# Patient Record
Sex: Female | Born: 1975 | Race: White | Hispanic: No | Marital: Single | Smoking: Current every day smoker
Health system: Southern US, Community
[De-identification: ages and names within clinical notes are randomized; demographics above are authoritative.]

## PROBLEM LIST (undated history)

## (undated) DIAGNOSIS — G2581 Restless legs syndrome: Secondary | ICD-10-CM

## (undated) DIAGNOSIS — R569 Unspecified convulsions: Secondary | ICD-10-CM

## (undated) DIAGNOSIS — I10 Essential (primary) hypertension: Secondary | ICD-10-CM

## (undated) DIAGNOSIS — I639 Cerebral infarction, unspecified: Secondary | ICD-10-CM

## (undated) DIAGNOSIS — J45909 Unspecified asthma, uncomplicated: Secondary | ICD-10-CM

## (undated) DIAGNOSIS — E876 Hypokalemia: Secondary | ICD-10-CM

## (undated) DIAGNOSIS — K219 Gastro-esophageal reflux disease without esophagitis: Secondary | ICD-10-CM

## (undated) DIAGNOSIS — F419 Anxiety disorder, unspecified: Secondary | ICD-10-CM

## (undated) HISTORY — PX: NO PAST SURGERIES: SHX2092

## (undated) HISTORY — DX: Unspecified convulsions: R56.9

## (undated) HISTORY — PX: PARTIAL HYSTERECTOMY: SHX80

## (undated) HISTORY — DX: Cerebral infarction, unspecified: I63.9

---

## 1999-10-20 ENCOUNTER — Emergency Department (HOSPITAL_COMMUNITY): Admission: EM | Admit: 1999-10-20 | Discharge: 1999-10-20 | Payer: Self-pay

## 1999-10-24 ENCOUNTER — Emergency Department (HOSPITAL_COMMUNITY): Admission: EM | Admit: 1999-10-24 | Discharge: 1999-10-24 | Payer: Self-pay | Admitting: Emergency Medicine

## 1999-11-23 ENCOUNTER — Emergency Department (HOSPITAL_COMMUNITY): Admission: EM | Admit: 1999-11-23 | Discharge: 1999-11-23 | Payer: Self-pay | Admitting: Internal Medicine

## 1999-11-23 ENCOUNTER — Encounter: Payer: Self-pay | Admitting: Internal Medicine

## 1999-12-03 ENCOUNTER — Encounter: Payer: Self-pay | Admitting: Emergency Medicine

## 1999-12-03 ENCOUNTER — Inpatient Hospital Stay (HOSPITAL_COMMUNITY): Admission: EM | Admit: 1999-12-03 | Discharge: 1999-12-03 | Payer: Self-pay | Admitting: Emergency Medicine

## 1999-12-06 ENCOUNTER — Emergency Department (HOSPITAL_COMMUNITY): Admission: EM | Admit: 1999-12-06 | Discharge: 1999-12-06 | Payer: Self-pay | Admitting: Emergency Medicine

## 1999-12-07 ENCOUNTER — Encounter: Payer: Self-pay | Admitting: Emergency Medicine

## 1999-12-07 ENCOUNTER — Emergency Department (HOSPITAL_COMMUNITY): Admission: EM | Admit: 1999-12-07 | Discharge: 1999-12-07 | Payer: Self-pay | Admitting: Emergency Medicine

## 2000-03-20 ENCOUNTER — Emergency Department (HOSPITAL_COMMUNITY): Admission: EM | Admit: 2000-03-20 | Discharge: 2000-03-20 | Payer: Self-pay | Admitting: Emergency Medicine

## 2000-05-20 ENCOUNTER — Encounter: Payer: Self-pay | Admitting: Emergency Medicine

## 2000-05-20 ENCOUNTER — Inpatient Hospital Stay (HOSPITAL_COMMUNITY): Admission: EM | Admit: 2000-05-20 | Discharge: 2000-05-22 | Payer: Self-pay | Admitting: Emergency Medicine

## 2000-05-28 ENCOUNTER — Emergency Department (HOSPITAL_COMMUNITY): Admission: EM | Admit: 2000-05-28 | Discharge: 2000-05-28 | Payer: Self-pay | Admitting: Emergency Medicine

## 2003-08-18 ENCOUNTER — Emergency Department (HOSPITAL_COMMUNITY): Admission: EM | Admit: 2003-08-18 | Discharge: 2003-08-19 | Payer: Self-pay | Admitting: Emergency Medicine

## 2006-08-26 ENCOUNTER — Emergency Department (HOSPITAL_COMMUNITY): Admission: EM | Admit: 2006-08-26 | Discharge: 2006-08-27 | Payer: Self-pay | Admitting: Emergency Medicine

## 2006-10-19 ENCOUNTER — Emergency Department (HOSPITAL_COMMUNITY): Admission: EM | Admit: 2006-10-19 | Discharge: 2006-10-19 | Payer: Self-pay | Admitting: Emergency Medicine

## 2007-02-07 ENCOUNTER — Emergency Department (HOSPITAL_COMMUNITY): Admission: EM | Admit: 2007-02-07 | Discharge: 2007-02-07 | Payer: Self-pay | Admitting: Emergency Medicine

## 2008-01-05 ENCOUNTER — Emergency Department (HOSPITAL_COMMUNITY): Admission: EM | Admit: 2008-01-05 | Discharge: 2008-01-06 | Payer: Self-pay | Admitting: Emergency Medicine

## 2008-05-21 ENCOUNTER — Encounter: Payer: Self-pay | Admitting: Obstetrics and Gynecology

## 2008-05-21 ENCOUNTER — Ambulatory Visit: Payer: Self-pay | Admitting: Obstetrics & Gynecology

## 2008-05-21 ENCOUNTER — Inpatient Hospital Stay (HOSPITAL_COMMUNITY): Admission: AD | Admit: 2008-05-21 | Discharge: 2008-05-23 | Payer: Self-pay | Admitting: Obstetrics & Gynecology

## 2011-01-04 NOTE — H&P (Signed)
NAME:  Danielle Washington, Danielle Washington                  ACCOUNT NO.:  [id number]   MEDICAL RECORD NO.:  [id number]          PATIENT TYPE:  INP   LOCATION:  9374                          FACILITY:  [facility]   PHYSICIAN:  Tilda Burrow, M.D. DATE OF BIRTH:  1976-01-28   DATE OF ADMISSION:  05/21/2008  DATE OF DISCHARGE:                              HISTORY & PHYSICAL   ADMITTING DIAGNOSES:  1. Pregnancy term, delivered, no prenatal care.  2. Chronic alcohol use.  3. Preeclampsia, ruled out.   HISTORY OF PRESENT ILLNESS:  This 35 year old female gravida 2, para 2-0-  0-2 with no prenatal care, allegedly unknown pregnancy until spontaneous  rupture of membranes at 2 p.m. today after 6 hours of lower abdominal  cramping, was transported by the EMS to [facility] [place]  approximately 4:30 p.m. after the baby had been delivered in the field  by EMS with healthy baby delivered.  Placenta was delivered in the MAU  by Dr. Iona Hansen without difficulty.  The patient's initial assessment was  notable for the blood pressures being as high as 160/100 with initial  prenatal labs ordered and magnesium sulfate initiated as precaution.  Since admission, she has been quite stable, has calmed down, and blood  pressures have dramatically responded.  Blood pressures are now 99/60  with reflexes 2+.  The patient denies any history of headaches, right  upper quadrant pain, or blurry vision.   PAST MEDICAL HISTORY:  Notable for chronic alcohol use with the patient  being very comfortably acknowledging use of 2 beers to make strength  most days, sometimes up to 4 on a day.  She does state that she did not  drink for almost a month during mid summer while caring for her father  during his illness.   PRIOR GYN HISTORY:  Notable for the patient is 16 years since her most  recent vaginal delivery.  She delivered her older child began Depo-  Provera which she has used continuously until 2008.  She has never had a  menses  since discontinuing the Depo-Provera mid 2008.  She had noticed  mild occasional abdominal discomfort, but was attributing it to her  chronic abdominal pain due to ulcers.   MEDICATIONS:  Depo-Provera until 2008 x16 years and Nexium taken  routinely for ulcers and gastritis.   ALLERGIES:  Denied.   OPERATIONS:  None.   Temperature 97.8, blood pressure 99/60, pulse 70.  Reflexes 2+ without  clonus, fundus firm, U-2.   Admitting labs have returned and show liver function test normal.  Platelets are normal at 205,000.  HIV is nonreactive, hepatitis B  negative, and RPR nonreactive.  Urinalysis shows specific gravity 1.030  with negative proteinuria.   IMPRESSION:  1. Unregistered pregnancy status post out-of-hospital delivery.  2. No evidence of preeclampsia.  3. Chronic alcohol use.   PLAN:  1. Discontinue magnesium sulfate.  2. Transfer to routine postpartum floor after 4 hours of magnesium      sulfate if the patient maintains current stable status.  3. Social Services evaluation in a.m.  The patient has  been living in      [place] and alternating resident stay with her boyfriend who is      living in [place] with mother (father recently died).  4. Social Services will be asked to assess her Medicaid application.      The patient wishes to consider tubal ligation in the near future      would consider Depo-Provera bridge prior to discharge.      Tilda Burrow, M.D.  Electronically Signed     JVF/MEDQ  D:  05/21/2008  T:  05/22/2008  Job:  [id number]

## 2011-01-04 NOTE — Discharge Summary (Signed)
NAME:  Danielle Washington, Danielle Washington                  ACCOUNT NO.:  [id number]   MEDICAL RECORD NO.:  [id number]          PATIENT TYPE:  INP   LOCATION:  9101                          [facility]ILITY:  [facility]   PHYSICIAN:  Lesly Dukes, M.D. DATE OF BIRTH:  Oct 23, 1975   DATE OF ADMISSION:  05/21/2008  DATE OF DISCHARGE:  05/23/2008                               DISCHARGE SUMMARY   DIAGNOSIS AT ADMISSION:  Term labor.   DIAGNOSIS AT DISCHARGE:  Status post delivery, normal spontaenous  vaginal delivery.   PROCEDURE DURING ADMISSION:  Non-stress test.   HISTORY AND HOSPITAL COURSE:  Briefly, this is a 35 year old G2, P71, who  presented with unknown gestational age, went into labor, and delivered  in ambulance en route to the hospital.  Baby had Apgars of 10 and 10.  Spontaneous placenta was delivered.  Urine drug screen was positive for  cocaine.  Blood pressures were elevated into 170s/110s.  The patient was  started on hydrochlorothiazide 25 and transferred to the ICU.  Blood  pressure came down nicely to 100s/70s.  Mag was stopped after 24 hours,  and pregnancy-induced hypertension labs were negative.  Blood type was O  positive.  The patient was RPR nonreactive.  Rubella immune.  At  discharge, the patient was eating, ambulatory, pain had resolved and was  controlled with NSAIDs, and was eager to go home.  Social work was  consulted during the patient's stay and felt that it was adequate for  the mother to take the baby home.   DISCHARGE CONDITION:  Stable.   DISCHARGE DISPOSITION:  Will be to home.   MEDICATIONS ON DISCHARGE:  1. Phenergan 25 mg p.o. q.6 h. p.r.n. nausea.  2. Hydrochlorothiazide 25 mg p.o. daily.  3. Ibuprofen 600 mg p.o. q.6 h. p.r.n. pain.  4. Colace 100 mg p.o. b.i.d. p.r.n. constipation.  5. Iron sulfate 325 mg p.o. b.i.d.   FOLLOWUP:  Will be with [facility] in  approximately 6 weeks.      Rodney Langton, MD      Lesly Dukes,  M.D.  Electronically Signed   TT/MEDQ  D:  05/23/2008  T:  05/23/2008  Job:  [id number]

## 2011-01-07 NOTE — H&P (Signed)
[place]  Patient:    Danielle Washington, Danielle Washington                           MRN: [id number] Adm. Date:  [id number] Attending:  Felecia Shelling                         History and Physical  CHIEF COMPLAINT:  Vomiting.  PRIMARY DOCTOR:  Unassigned.  HISTORY OF PRESENT ILLNESS:  The patient is a 35 year old white female who presented to the emergency room on the day of admission with a one day history of persistent nausea, vomiting and pain. She was evaluated in the emergency room where she was noted to be vomiting persistently. She was given intravenous Phenergan, then Compazine, and then Zofran, but continued to have vomiting and abdominal pain. A CT scan of her abdomen was unremarkable. Evaluation revealed findings consistent with a urinary tract infection and possible pyelonephritis. She has been afebrile, but has complained of some diffuse abdominal pain radiating more to the right side, and particularly the right lower quadrant. There has not been any diarrhea. The last several episodes of vomiting have produced some coffee ground material in the emesis. She has had some lower back pain. She is admitted for management of intractable vomiting, dehydration and probable pyelonephritis.  ALLERGIES:  None.  MEDICATIONS:  None.  PAST MEDICAL HISTORY:  She denies any significant medical illnesses or hospitalizations. She has not had any surgery.  FAMILY HISTORY:  She is somewhat vague, but says the parents are in good health. There is no history of heart disease, diabetes or cancer.  SOCIAL HISTORY:  She does drink alcohol. She does smoke. She has a history of cocaine use, in particular, crack cocaine.  REVIEW OF SYSTEMS:  She denies any headache. She was noted to have multiple bruises including a left black eye. She, however, vehemently denies any abuse. She denies any visual changes. She has not had any hearing changes. No difficulty with nasal  congestion or difficulty swallowing. She denies any chest pain, shortness of breath or palpitations. She has had the diffuse abdominal pain, no diarrhea. No changes in bowel movements and no blood in her stool. She has not had any changes in her menstrual periods and she denies any dysuria or urinary frequency. She has not had a vaginal discharge. She denies any problems with her extremities.  PHYSICAL EXAMINATION:  GENERAL:  The patient is a well-developed, well-nourished, lethargic white female. She is somewhat vague in answering her questions.  VITAL SIGNS:  Afebrile. Blood pressure 110/70, pulse 90 and regular.  HEENT:  The head is of normal configuration without lesions. Eyes; pupils are equal, round and reactive to light and accommodation. Normal extraocular movements. Fundi is benign. There is some bruising around the left eye and diffuse excoriations on the face. Ears; TMs are clear. The throat is clear.  NECK:  Supple without adenopathy or thyromegaly.  LUNGS:  Clear to auscultation and percussion.  HEART:  Regular rate and rhythm. Normal S1 and S2 without murmurs, gallops or rubs.  ABDOMEN:  Reveals active bowel sounds. Soft. Mild diffuse tenderness, worse in the right lower quadrant.  PELVIC:  Performed by Dr. Ethelda Chick. There was no pelvic pain or cervical motion tenderness. Wet prep was negative.  RECTAL:  Performed by Dr. Ethelda Chick, and was benign. Stool was heme-negative.  EXTREMITIES:  Normal with good range of motion of  all joints.  NEUROLOGIC:  The patient is awake, alert and oriented. She answers questions appropriately. Cranial nerves 2-12 are intact. Motor, sensory and cerebellar findings were within normal limits.  LABORATORY DATA:  White blood count 11,100, hemoglobin 14.4 with 88 segs, 9 lymphocytes, 2 monocytes. Sodium 137, potassium 3.8, chloride 103, CO2 26, creatinine 0.6, BUN 8, glucose 147. Liver function studies were normal. Lipase was  normal.  Vaginal wet prep was negative. GC and chlamydia are pending. Pregnancy test was negative.  CT scan of the abdomen was negative.  Urinalysis; positive leukocytes, positive nitrites, too numerous to count white blood cells.  ASSESSMENT: 1. Probable pyelonephritis. 2. Intractable vomiting. 3. Past history of cocaine abuse. 4. Dehydration.  PLAN:  The patient will be admitted and will begin IV hydration with normal saline at 150 cc/hr. She has been started on Cipro and will continue Cipro 400 mg IV q.12h. Phenergan intravenously for nausea. DD:  05/20/00 TD:  05/20/00 Job: [id number]

## 2011-01-07 NOTE — Discharge Summary (Signed)
[place]  Patient:    Danielle Washington, Danielle Washington                           MRN: [id number] Adm. Date:  [id number] Disc. Date: [id number] Attending:  Phifer, Harriett Sine [place] Dictator:   Karlene Einstein, M.D.                           Discharge Summary  DISCHARGE DIAGNOSES: 1. Nausea and vomiting secondary to ingestion of crack cocaine. 2. History of dyspepsia. 3. Tobacco abuse. 4. History of hypokalemia. 5. Urinary tract infection.  PROCEDURES:  Acute abdominal series.  No acute or focal abnormalities noted.  CONSULTANTS:  None.  DISCHARGE MEDICATIONS:  Pepcid 20 mg p.o. q.h.s.  [phone] as needed or if the symptoms persist.  Please call for an appointment with Dr. Kerry Dory.  Also the number for ADS was given if patient is interested in getting help with her drug problems.  HISTORY OF PRESENT ILLNESS:  A 35 year old white female with history of mild dyspepsia who swallowed several rocks of crack cocaine two nights ago because she was afraid she was going to get caught with it.  Several hours later she began cramping and got nauseated and vomited blood tinged material.  She does not know if the crack came up.  Since that time she has been unable to hold down anything by mouth.  She denies headaches, blurry vision, chest pain, shortness of breath, or black and bloody stools.  PHYSICAL EXAMINATION:  VITAL SIGNS:  Temperature 98.6.  Pulse 106.  Respiration rate 18.  Blood pressure 120/74.  GENERAL:  Anxious white female rocking back and forth in bed.  HEENT:  Normocephalic, atraumatic.  Pupils are equal, round and reactive to light and accommodation.  Extraocular muscles intact.  No papilledema or exudate.  Oropharynx:  Cracked lips.  No blood.  Nasopharynx with dried blood (failed NGT insertion).  NECK:  Supple.  No lymphadenopathy.  LUNGS:  Clear.  HEART:  Regular rate and rhythm.  Good carotid  upstroke.  ABDOMEN:  Positive bowel sounds.  Soft, nontender.  No bruits.  No masses.  RECTAL:  No stool.  Guaiac positive.  EXTREMITIES:  No edema.  Moves all extremities.  NEUROLOGIC:  Sleepy, but arousable.  Alert and oriented x 3.  Moves all four extremities.  No focal deficits.  LABORATORIES:  Pregnancy test negative.  Urinalysis:  6-10 wbcs, many bacteria, greater than 80 ketones, nitrites positive, small leukocyte esterase.  Urine drug screen positive for cocaine and PHC.  CK 29, MB 1.8, troponin less than 0.03.  WBC 7.6, hemoglobin 13.4, platelets 233.  PT 14.2, INR 1.2, PTT 31.  Sodium 135, potassium 3.2, chloride 100, bicarbonate 28, BUN 8, creatinine 0.5, glucose 89, total protein 5.7, albumin 3.1, AST 16, ALT 11, alkaline phosphatase 50, total bilirubin 0.4.  HOSPITAL COURSE: #1 - NAUSEA AND VOMITING:  Usually ingestion of cocaine is treated with gastric lavage and charcoal.  However, this ingestion occurred 48 hours ago and would not be useful to do the procedure.  Also, she is not hypertensive. Does not have chest pain or shortness of breath.  Has negative enzymes.  She does have guaiac positive stools and intractable nausea and vomiting.  She was admitted to rule out obstruction.  Her acute abdominal series was negative. Hydrated with IV fluids and hemoglobin monitored.  Hemoglobin  was stable at 13.  Potassium was 4.3, lactic acid level 0.8, amylase 31, lipase 19, magnesium 1.7.  Over night she responded to IV fluids and she had no nausea or vomiting, no abdominal pain.  She was started on a clear liquid diet and advanced as tolerated.  She was discharged in stable condition without symptoms.  Will follow up as noted above.  #2 - URINARY TRACT INFECTION:  Urine culture was pending.  She was started on Bactrim DS one tablet p.o. b.i.d. for three days.  #3 - QUESTIONABLE DRUG ABUSE:  Although she denies history of drug abuse, it is doubtful.  Information about ADS  was given if patient is interested. DD:  12/03/99 TD:  12/03/99 Job: [id number]

## 2011-05-18 LAB — COMPREHENSIVE METABOLIC PANEL
AST: 15
Albumin: 3.7
Calcium: 9.4
Chloride: 97
Creatinine, Ser: 0.63
GFR calc Af Amer: >60

## 2011-05-18 LAB — LIPASE, BLOOD: Lipase: 14

## 2011-05-18 LAB — OCCULT BLOOD X 1 CARD TO LAB, STOOL: Fecal Occult Bld: NEGATIVE

## 2011-05-23 LAB — URINALYSIS, ROUTINE W REFLEX MICROSCOPIC
Protein, ur: NEGATIVE
Urobilinogen, UA: 0.2

## 2011-05-23 LAB — COMPREHENSIVE METABOLIC PANEL
ALT: 10
CO2: 21
Calcium: 8.6
Creatinine, Ser: 0.58
GFR calc non Af Amer: >60
Glucose, Bld: 96
Sodium: 136
Total Bilirubin: 0.3

## 2011-05-23 LAB — HEPATITIS B SURFACE ANTIGEN: Hepatitis B Surface Ag: NEGATIVE

## 2011-05-23 LAB — CBC
Hemoglobin: 12.2
MCHC: 33.3
MCV: 95.4
RBC: 3.85 — ABNORMAL LOW

## 2011-05-23 LAB — DIFFERENTIAL
Basophils Absolute: 0
Eosinophils Absolute: 0
Lymphs Abs: 1
Neutrophils Relative %: 90 — ABNORMAL HIGH

## 2011-05-23 LAB — RAPID URINE DRUG SCREEN, HOSP PERFORMED: Opiates: NOT DETECTED

## 2011-05-23 LAB — AMYLASE: Amylase: 38

## 2011-05-23 LAB — RAPID HIV SCREEN (WH-MAU): Rapid HIV Screen: NONREACTIVE

## 2011-05-24 LAB — CBC
HCT: 34.7 — ABNORMAL LOW
Hemoglobin: 11.5 — ABNORMAL LOW
MCHC: 33.2
MCV: 95.3
RDW: 14.3

## 2011-06-08 LAB — WET PREP, GENITAL
Trich, Wet Prep: NONE SEEN
WBC, Wet Prep HPF POC: NONE SEEN
Yeast Wet Prep HPF POC: NONE SEEN

## 2011-06-08 LAB — URINALYSIS, ROUTINE W REFLEX MICROSCOPIC
Bilirubin Urine: NEGATIVE
Glucose, UA: NEGATIVE
Hgb urine dipstick: NEGATIVE
Ketones, ur: 15 — AB
Protein, ur: 100 — AB
pH: 8.5 — ABNORMAL HIGH

## 2011-06-08 LAB — DIFFERENTIAL
Basophils Absolute: 0.1
Eosinophils Absolute: 0
Eosinophils Relative: 0
Lymphocytes Relative: 7 — ABNORMAL LOW
Lymphs Abs: 1.3
Monocytes Absolute: 1.6 — ABNORMAL HIGH

## 2011-06-08 LAB — CBC
HCT: 47.9 — ABNORMAL HIGH
Hemoglobin: 16.2 — ABNORMAL HIGH
MCV: 94.8
Platelets: 283
RDW: 15 — ABNORMAL HIGH

## 2011-06-08 LAB — BASIC METABOLIC PANEL
BUN: 15
Chloride: 88 — ABNORMAL LOW
Glucose, Bld: 168 — ABNORMAL HIGH
Potassium: 2.8 — ABNORMAL LOW
Sodium: 139

## 2011-06-08 LAB — GC/CHLAMYDIA PROBE AMP, GENITAL: GC Probe Amp, Genital: NEGATIVE

## 2014-08-29 ENCOUNTER — Other Ambulatory Visit (HOSPITAL_COMMUNITY): Payer: Self-pay | Admitting: Nurse Practitioner

## 2014-08-29 DIAGNOSIS — Z1231 Encounter for screening mammogram for malignant neoplasm of breast: Secondary | ICD-10-CM

## 2014-09-03 ENCOUNTER — Ambulatory Visit (HOSPITAL_COMMUNITY): Payer: Self-pay

## 2014-09-04 ENCOUNTER — Ambulatory Visit (HOSPITAL_COMMUNITY): Payer: Self-pay

## 2015-05-22 ENCOUNTER — Inpatient Hospital Stay (HOSPITAL_COMMUNITY)
Admission: EM | Admit: 2015-05-22 | Discharge: 2015-05-25 | DRG: 065 | Disposition: A | Discharge status: Home / Self Care | Payer: Medicaid Other | Attending: Internal Medicine | Admitting: Internal Medicine

## 2015-05-22 ENCOUNTER — Encounter (HOSPITAL_COMMUNITY): Payer: Self-pay | Admitting: Emergency Medicine

## 2015-05-22 DIAGNOSIS — R112 Nausea with vomiting, unspecified: Secondary | ICD-10-CM | POA: Diagnosis present

## 2015-05-22 DIAGNOSIS — F1210 Cannabis abuse, uncomplicated: Secondary | ICD-10-CM | POA: Diagnosis present

## 2015-05-22 DIAGNOSIS — R1110 Vomiting, unspecified: Secondary | ICD-10-CM | POA: Insufficient documentation

## 2015-05-22 DIAGNOSIS — R410 Disorientation, unspecified: Secondary | ICD-10-CM

## 2015-05-22 DIAGNOSIS — Z823 Family history of stroke: Secondary | ICD-10-CM

## 2015-05-22 DIAGNOSIS — F17210 Nicotine dependence, cigarettes, uncomplicated: Secondary | ICD-10-CM | POA: Diagnosis present

## 2015-05-22 DIAGNOSIS — I638 Other cerebral infarction: Principal | ICD-10-CM | POA: Diagnosis present

## 2015-05-22 DIAGNOSIS — Z720 Tobacco use: Secondary | ICD-10-CM | POA: Diagnosis present

## 2015-05-22 DIAGNOSIS — J45909 Unspecified asthma, uncomplicated: Secondary | ICD-10-CM | POA: Diagnosis present

## 2015-05-22 DIAGNOSIS — R451 Restlessness and agitation: Secondary | ICD-10-CM

## 2015-05-22 DIAGNOSIS — R1111 Vomiting without nausea: Secondary | ICD-10-CM

## 2015-05-22 DIAGNOSIS — I639 Cerebral infarction, unspecified: Secondary | ICD-10-CM

## 2015-05-22 DIAGNOSIS — E871 Hypo-osmolality and hyponatremia: Secondary | ICD-10-CM | POA: Diagnosis present

## 2015-05-22 DIAGNOSIS — R4789 Other speech disturbances: Secondary | ICD-10-CM | POA: Diagnosis present

## 2015-05-22 DIAGNOSIS — K219 Gastro-esophageal reflux disease without esophagitis: Secondary | ICD-10-CM | POA: Diagnosis present

## 2015-05-22 DIAGNOSIS — F17200 Nicotine dependence, unspecified, uncomplicated: Secondary | ICD-10-CM | POA: Diagnosis present

## 2015-05-22 DIAGNOSIS — I10 Essential (primary) hypertension: Secondary | ICD-10-CM | POA: Diagnosis present

## 2015-05-22 DIAGNOSIS — F1910 Other psychoactive substance abuse, uncomplicated: Secondary | ICD-10-CM | POA: Diagnosis present

## 2015-05-22 DIAGNOSIS — E876 Hypokalemia: Secondary | ICD-10-CM

## 2015-05-22 HISTORY — DX: Unspecified asthma, uncomplicated: J45.909

## 2015-05-22 HISTORY — DX: Essential (primary) hypertension: I10

## 2015-05-22 LAB — COMPREHENSIVE METABOLIC PANEL
ALBUMIN: 3.1 g/dL — AB (ref 3.5–5.0)
ALK PHOS: 68 U/L (ref 38–126)
ALT: 15 U/L (ref 14–54)
ANION GAP: 10 (ref 5–15)
AST: 17 U/L (ref 15–41)
BILIRUBIN TOTAL: 0.3 mg/dL (ref 0.3–1.2)
CALCIUM: 9 mg/dL (ref 8.9–10.3)
CO2: 28 mmol/L (ref 22–32)
Chloride: 94 mmol/L — ABNORMAL LOW (ref 101–111)
Creatinine, Ser: 0.64 mg/dL (ref 0.44–1.00)
GFR calc Af Amer: >60 mL/min (ref >60)
GFR calc non Af Amer: >60 mL/min (ref >60)
GLUCOSE: 115 mg/dL — AB (ref 65–99)
Potassium: 2.6 mmol/L — CL (ref 3.5–5.1)
Sodium: 132 mmol/L — ABNORMAL LOW (ref 135–145)
TOTAL PROTEIN: 5.8 g/dL — AB (ref 6.5–8.1)

## 2015-05-22 LAB — URINALYSIS, ROUTINE W REFLEX MICROSCOPIC
BILIRUBIN URINE: NEGATIVE
Glucose, UA: NEGATIVE mg/dL
HGB URINE DIPSTICK: NEGATIVE
Ketones, ur: 15 mg/dL — AB
Nitrite: NEGATIVE
PROTEIN: NEGATIVE mg/dL
Specific Gravity, Urine: 1.027 (ref 1.005–1.030)
UROBILINOGEN UA: 0.2 mg/dL (ref 0.0–1.0)
pH: 6 (ref 5.0–8.0)

## 2015-05-22 LAB — CBC
HCT: 38.3 % (ref 36.0–46.0)
Hemoglobin: 13.1 g/dL (ref 12.0–15.0)
MCH: 30.1 pg (ref 26.0–34.0)
MCHC: 34.2 g/dL (ref 30.0–36.0)
MCV: 88 fL (ref 78.0–100.0)
Platelets: 218 10*3/uL (ref 150–400)
RBC: 4.35 MIL/uL (ref 3.87–5.11)
RDW: 14.1 % (ref 11.5–15.5)
WBC: 8.6 10*3/uL (ref 4.0–10.5)

## 2015-05-22 LAB — URINE MICROSCOPIC-ADD ON

## 2015-05-22 LAB — LIPASE, BLOOD: Lipase: 35 U/L (ref 22–51)

## 2015-05-22 LAB — POC URINE PREG, ED: PREG TEST UR: NEGATIVE

## 2015-05-22 NOTE — ED Notes (Signed)
Pt. reports persistent emesis onset 2 weeks ago , denies diarrhea , no fever or chills . Pt. added generalized weakness/fatigue this week , alert and oriented / respirations unlabored.

## 2015-05-23 ENCOUNTER — Other Ambulatory Visit (HOSPITAL_COMMUNITY): Payer: Self-pay

## 2015-05-23 ENCOUNTER — Inpatient Hospital Stay (HOSPITAL_COMMUNITY): Payer: Medicaid Other

## 2015-05-23 ENCOUNTER — Observation Stay (HOSPITAL_COMMUNITY): Payer: Medicaid Other

## 2015-05-23 ENCOUNTER — Encounter (HOSPITAL_COMMUNITY): Payer: Self-pay

## 2015-05-23 DIAGNOSIS — R112 Nausea with vomiting, unspecified: Secondary | ICD-10-CM | POA: Diagnosis present

## 2015-05-23 DIAGNOSIS — Z823 Family history of stroke: Secondary | ICD-10-CM | POA: Diagnosis not present

## 2015-05-23 DIAGNOSIS — R410 Disorientation, unspecified: Secondary | ICD-10-CM | POA: Diagnosis present

## 2015-05-23 DIAGNOSIS — F1910 Other psychoactive substance abuse, uncomplicated: Secondary | ICD-10-CM | POA: Diagnosis not present

## 2015-05-23 DIAGNOSIS — I10 Essential (primary) hypertension: Secondary | ICD-10-CM | POA: Diagnosis present

## 2015-05-23 DIAGNOSIS — Z720 Tobacco use: Secondary | ICD-10-CM | POA: Diagnosis not present

## 2015-05-23 DIAGNOSIS — Z87898 Personal history of other specified conditions: Secondary | ICD-10-CM

## 2015-05-23 DIAGNOSIS — K219 Gastro-esophageal reflux disease without esophagitis: Secondary | ICD-10-CM | POA: Diagnosis present

## 2015-05-23 DIAGNOSIS — F1210 Cannabis abuse, uncomplicated: Secondary | ICD-10-CM | POA: Diagnosis present

## 2015-05-23 DIAGNOSIS — I639 Cerebral infarction, unspecified: Secondary | ICD-10-CM | POA: Insufficient documentation

## 2015-05-23 DIAGNOSIS — F17200 Nicotine dependence, unspecified, uncomplicated: Secondary | ICD-10-CM | POA: Diagnosis present

## 2015-05-23 DIAGNOSIS — R1111 Vomiting without nausea: Secondary | ICD-10-CM

## 2015-05-23 DIAGNOSIS — F17210 Nicotine dependence, cigarettes, uncomplicated: Secondary | ICD-10-CM | POA: Diagnosis present

## 2015-05-23 DIAGNOSIS — E876 Hypokalemia: Secondary | ICD-10-CM | POA: Diagnosis present

## 2015-05-23 DIAGNOSIS — R451 Restlessness and agitation: Secondary | ICD-10-CM

## 2015-05-23 DIAGNOSIS — E871 Hypo-osmolality and hyponatremia: Secondary | ICD-10-CM | POA: Diagnosis present

## 2015-05-23 DIAGNOSIS — R4789 Other speech disturbances: Secondary | ICD-10-CM | POA: Diagnosis present

## 2015-05-23 DIAGNOSIS — I638 Other cerebral infarction: Secondary | ICD-10-CM | POA: Diagnosis present

## 2015-05-23 DIAGNOSIS — J45909 Unspecified asthma, uncomplicated: Secondary | ICD-10-CM | POA: Diagnosis present

## 2015-05-23 LAB — CBC
HEMATOCRIT: 35.7 % — AB (ref 36.0–46.0)
HEMOGLOBIN: 12.3 g/dL (ref 12.0–15.0)
MCH: 30.3 pg (ref 26.0–34.0)
MCHC: 34.5 g/dL (ref 30.0–36.0)
MCV: 87.9 fL (ref 78.0–100.0)
Platelets: 186 10*3/uL (ref 150–400)
RBC: 4.06 MIL/uL (ref 3.87–5.11)
RDW: 14.1 % (ref 11.5–15.5)
WBC: 8.5 10*3/uL (ref 4.0–10.5)

## 2015-05-23 LAB — SEDIMENTATION RATE: SED RATE: 25 mm/h — AB (ref 0–22)

## 2015-05-23 LAB — COMPREHENSIVE METABOLIC PANEL
ALBUMIN: 3 g/dL — AB (ref 3.5–5.0)
ALT: 14 U/L (ref 14–54)
ANION GAP: 7 (ref 5–15)
AST: 12 U/L — AB (ref 15–41)
Alkaline Phosphatase: 63 U/L (ref 38–126)
BILIRUBIN TOTAL: 0.2 mg/dL — AB (ref 0.3–1.2)
CHLORIDE: 96 mmol/L — AB (ref 101–111)
CO2: 30 mmol/L (ref 22–32)
Calcium: 8.9 mg/dL (ref 8.9–10.3)
Creatinine, Ser: 0.6 mg/dL (ref 0.44–1.00)
GFR calc Af Amer: >60 mL/min (ref >60)
GFR calc non Af Amer: >60 mL/min (ref >60)
GLUCOSE: 97 mg/dL (ref 65–99)
POTASSIUM: 3.3 mmol/L — AB (ref 3.5–5.1)
SODIUM: 133 mmol/L — AB (ref 135–145)
TOTAL PROTEIN: 5.6 g/dL — AB (ref 6.5–8.1)

## 2015-05-23 LAB — RAPID URINE DRUG SCREEN, HOSP PERFORMED
Amphetamines: POSITIVE — AB
BARBITURATES: NOT DETECTED
BENZODIAZEPINES: NOT DETECTED
COCAINE: NOT DETECTED
OPIATES: POSITIVE — AB
Tetrahydrocannabinol: POSITIVE — AB

## 2015-05-23 LAB — TROPONIN I: Troponin I: <0.03 ng/mL (ref <0.031)

## 2015-05-23 LAB — MAGNESIUM
Magnesium: 1.7 mg/dL (ref 1.7–2.4)
Magnesium: 1.6 mg/dL — ABNORMAL LOW (ref 1.7–2.4)

## 2015-05-23 LAB — ETHANOL: Alcohol, Ethyl (B): <5 mg/dL (ref <5)

## 2015-05-23 LAB — CREATININE, SERUM: CREATININE: 0.45 mg/dL (ref 0.44–1.00)

## 2015-05-23 LAB — BRAIN NATRIURETIC PEPTIDE: B NATRIURETIC PEPTIDE 5: 14.2 pg/mL (ref 0.0–100.0)

## 2015-05-23 LAB — C-REACTIVE PROTEIN

## 2015-05-23 LAB — TSH: TSH: 4.614 u[IU]/mL — AB (ref 0.350–4.500)

## 2015-05-23 MED ORDER — ASPIRIN EC 81 MG PO TBEC
81.0000 mg | DELAYED_RELEASE_TABLET | Freq: Every day | ORAL | Status: DC
  Administered 2015-05-23 – 2015-05-25 (×3): 81 mg via ORAL
  Filled 2015-05-23 (×3): qty 1

## 2015-05-23 MED ORDER — ACETAMINOPHEN 325 MG PO TABS: 650.0000 mg | ORAL_TABLET | Freq: Four times a day (QID) | ORAL | Status: DC | PRN

## 2015-05-23 MED ORDER — DOCUSATE SODIUM 100 MG PO CAPS
100.0000 mg | ORAL_CAPSULE | Freq: Two times a day (BID) | ORAL | Status: DC
  Administered 2015-05-23 – 2015-05-25 (×6): 100 mg via ORAL
  Filled 2015-05-23 (×7): qty 1

## 2015-05-23 MED ORDER — BOOST / RESOURCE BREEZE PO LIQD
1.0000 | Freq: Two times a day (BID) | ORAL | Status: DC
  Administered 2015-05-23 – 2015-05-25 (×4): 1 via ORAL

## 2015-05-23 MED ORDER — POTASSIUM CHLORIDE CRYS ER 20 MEQ PO TBCR
40.0000 meq | EXTENDED_RELEASE_TABLET | Freq: Once | ORAL | Status: AC
  Administered 2015-05-23: 40 meq via ORAL
  Filled 2015-05-23: qty 2

## 2015-05-23 MED ORDER — POTASSIUM CHLORIDE IN NACL 20-0.9 MEQ/L-% IV SOLN
INTRAVENOUS | Status: DC
  Administered 2015-05-23: 04:00:00 via INTRAVENOUS
  Filled 2015-05-23 (×2): qty 1000

## 2015-05-23 MED ORDER — OXYCODONE HCL 5 MG PO TABS: 5.0000 mg | ORAL_TABLET | ORAL | Status: DC | PRN

## 2015-05-23 MED ORDER — MAGNESIUM SULFATE 2 GM/50ML IV SOLN
2.0000 g | Freq: Once | INTRAVENOUS | Status: AC
  Administered 2015-05-23: 2 g via INTRAVENOUS
  Filled 2015-05-23 (×3): qty 50

## 2015-05-23 MED ORDER — NICOTINE 21 MG/24HR TD PT24
21.0000 mg | MEDICATED_PATCH | Freq: Every day | TRANSDERMAL | Status: DC
  Administered 2015-05-23 – 2015-05-25 (×3): 21 mg via TRANSDERMAL
  Filled 2015-05-23 (×4): qty 1

## 2015-05-23 MED ORDER — ACETAMINOPHEN 650 MG RE SUPP: 650.0000 mg | Freq: Four times a day (QID) | RECTAL | Status: DC | PRN

## 2015-05-23 MED ORDER — POTASSIUM CHLORIDE 10 MEQ/100ML IV SOLN
10.0000 meq | Freq: Once | INTRAVENOUS | Status: AC
  Administered 2015-05-23: 10 meq via INTRAVENOUS
  Filled 2015-05-23: qty 100

## 2015-05-23 MED ORDER — ENOXAPARIN SODIUM 40 MG/0.4ML ~~LOC~~ SOLN
40.0000 mg | Freq: Every day | SUBCUTANEOUS | Status: DC
  Administered 2015-05-23 – 2015-05-25 (×3): 40 mg via SUBCUTANEOUS
  Filled 2015-05-23 (×3): qty 0.4

## 2015-05-23 MED ORDER — TRAZODONE HCL 50 MG PO TABS
50.0000 mg | ORAL_TABLET | Freq: Every day | ORAL | Status: DC
  Administered 2015-05-23 – 2015-05-24 (×2): 50 mg via ORAL
  Filled 2015-05-23 (×2): qty 1

## 2015-05-23 MED ORDER — SODIUM CHLORIDE 0.9 % IV SOLN
INTRAVENOUS | Status: DC
  Administered 2015-05-23: 02:00:00 via INTRAVENOUS

## 2015-05-23 MED ORDER — PANTOPRAZOLE SODIUM 40 MG PO TBEC
40.0000 mg | DELAYED_RELEASE_TABLET | Freq: Every day | ORAL | Status: DC
  Administered 2015-05-23 – 2015-05-25 (×3): 40 mg via ORAL
  Filled 2015-05-23 (×3): qty 1

## 2015-05-23 MED ORDER — IOHEXOL 350 MG/ML SOLN
50.0000 mL | Freq: Once | INTRAVENOUS | Status: AC | PRN
  Administered 2015-05-23: 50 mL via INTRAVENOUS

## 2015-05-23 NOTE — H&P (Signed)
[facility] History and Physical  Danielle Washington BJY:[id number] DOB: 01/10/1976 DOA: 05/22/2015   PCP: No primary care provider on file.    Chief Complaint: Postprandial nausea vomiting  HPI: Danielle Washington is a 39 y.o. WF PMHx  Hx substance abuse (cocaine),  Patient started having vomiting 2 weeks ago. For the first week she apparently had only vomiting. This was happening every day. She denies there was associated fever or chills. There was no associated diarrhea or pain. Over the course of the second week she began developing confusion which is particularly notable at night time. Her companion reports that she's been getting very agitated and confused. The patient reports that she recognizes her confusion. She denies any history of alcohol used there is no drug use history she denies any prior medical history. There have been no new medications. They denied observing a similar type of episode. Up until approximately 2 weeks ago the patient was going to work regularly. The patient continues to build a self dress, self toilet and feed herself. Her family member however reports that they are doing everything as far as cooking for her and her increasing confusion and is making it more difficult for her to manage in her home. Up until today patient had postprandial N/V. Today patient ate cheese biscuit without any postprandial N/V. Currently A/O 4, however daughter-in-law insists patient having some cognitive difficulty (states while in the waiting room patient walked around stating she did not know what she was doing)    Review of Systems: The patient denies anorexia, fever, weight loss,, vision loss, decreased hearing, hoarseness, chest pain, syncope, dyspnea on exertion, peripheral edema, balance deficits, hemoptysis, abdominal pain, melena, hematochezia, severe indigestion/heartburn, hematuria, incontinence, genital sores, muscle weakness, suspicious skin lesions, transient blindness, difficulty  walking, depression, unusual weight change, abnormal bleeding, enlarged lymph nodes, angioedema, and breast masses.    TRAVEL HISTORY:  None  Consultants:  None  Procedure/Significant Events:  MRI brain pending    Culture  None   Antibiotics:   None  DVT prophylaxis:  Lovenox  Devices  NA   LINES / TUBES:  NA   History reviewed. No pertinent past medical history. History reviewed. No pertinent past surgical history. Social History:  reports that she has been smoking.  She does not have any smokeless tobacco history on file. She reports that she does not drink alcohol or use illicit drugs.  where does patient live--home, ALF, SNF? and with whom if at home? Lives home alone Can patient participate in ADLs? Yes  No Known Allergies  No family history on file. Spoke with patient/daughter-in-law and patient/daughter-in-law stated no HTN, MI, HLD, Mental illness, CVA, Thyroid Dz or Cancer in their family history.     Prior to Admission medications   Medication Sig Start Date End Date Taking? Authorizing Provider  ibuprofen (ADVIL,MOTRIN) 400 MG tablet Take 400 mg by mouth every 6 (six) hours as needed for mild pain.   Yes Historical Provider, MD  medroxyPROGESTERone (DEPO-PROVERA) 150 MG/ML injection Inject 150 mg into the muscle every 3 (three) months.   Yes Historical Provider, MD   Physical Exam: Filed Vitals:   05/23/15 0000 05/23/15 0015 05/23/15 0030 05/23/15 0100  BP: 123/74 115/75 118/77 127/78  Pulse: 83 81 78 91  Temp:      TempSrc:      Resp:      SpO2: 97% 98% 99% 98%     General: A/O 4, NAD, No acute respiratory distress Eyes: Negative headache, eye  pain, double vision, scotomas, floaters, negative scleral hemorrhage ENT: Negative Runny nose, negative ear pain, negative tinnitus, negative gingival bleeding, negative odynophagia Neck:  Negative scars, masses, torticollis, lymphadenopathy, JVD Lungs: Clear to auscultation bilaterally without  wheezes or crackles Cardiovascular: Regular rate and rhythm without murmur gallop or rub normal S1 and S2 Abdomen:negative abdominal pain, negative dysphagia, Nontender, nondistended, soft, bowel sounds positive, no rebound, no ascites, no appreciable mass Extremities: No significant cyanosis, clubbing, or edema bilateral lower extremities Psychiatric:  Negative depression, negative anxiety, negative fatigue, negative mania  Neurologic:  Cranial nerves II through XII intact, tongue/uvula midline, all extremities muscle strength 5/5, sensation intact throughout, negative dysarthria, negative expressive aphasia, negative receptive aphasia.  Endocrine;   Hyperthyroid Negative sweaty, palpitations, visual disturbances, palpitations, visual disturbances, tremors.  HYPOTHYROID; negative tired, negative depressed, negative thin hair, negative croaky voice, negative heavy periods, negative constipation negative. HYPOGLYCEMIA negative dizziness, sweating, headache,hunger, tongue dysarticulation.  Skin/Hemolytic/lymphatic; negative purpura, petechia,     Labs on Admission:  Basic Metabolic Panel:  Recent Labs Lab 05/22/15 2240 05/23/15 0128  NA 132*  --   K 2.6*  --   CL 94*  --   CO2 28  --   GLUCOSE 115*  --   BUN <5*  --   CREATININE 0.64  --   CALCIUM 9.0  --   MG  --  1.7   Liver Function Tests:  Recent Labs Lab 05/22/15 2240  AST 17  ALT 15  ALKPHOS 68  BILITOT 0.3  PROT 5.8*  ALBUMIN 3.1*    Recent Labs Lab 05/22/15 2240  LIPASE 35   No results for input(s): AMMONIA in the last 168 hours. CBC:  Recent Labs Lab 05/22/15 2240  WBC 8.6  HGB 13.1  HCT 38.3  MCV 88.0  PLT 218   Cardiac Enzymes: No results for input(s): CKTOTAL, CKMB, CKMBINDEX, TROPONINI in the last 168 hours.  BNP (last 3 results) No results for input(s): BNP in the last 8760 hours.  ProBNP (last 3 results) No results for input(s): PROBNP in the last 8760 hours.  CBG: No results for  input(s): GLUCAP in the last 168 hours.  Radiological Exams on Admission: No results found.  EKG: Sinus rhythm, nonspecific ST-T wave changes V1 through V3  Assessment/Plan Principal Problem:   Nausea and vomiting Active Problems:   Altered mental status   History of substance abuse   Nausea and vomiting -Possibly multifactorial to include gastroenteritis, acute coronary syndrome, substance abuse, CVA. -Will rehydrate patient and correct electrolyte abnormalities -Obtain MRI brain -Obtain UDS and alcohol level  Altered mental status -Patient is A/O 4, however daughter-in-law feels not at baseline cognition. Neuro vascular checks every 4hr  Hx substance abuse -See nausea and vomiting   Code Status: Full Family Communication: Daughter-in-law  Disposition Plan: DC in a.m. if workup negative   Care during the described time interval was provided by me .  I have reviewed this patient's available data, including medical history, events of note, physical examination, and all test results as part of my evaluation. I have personally reviewed and interpreted all radiology studies.    Time spent: 65 minutes  Drema Dallas [facility] Pager [phone]  If 7PM-7AM, please contact night-coverage [email or web address] Password [email or web address] 05/23/2015, 1:55 AM

## 2015-05-23 NOTE — Consult Note (Signed)
Referring Physician: Dr. Conley Canal    Chief Complaint: Stroke  HPI: Danielle Washington is a 39 y.o. female with a history of substance abuse (cocaine in the past), tobacco use, hypertension, and asthma admitted to [facility] today with a two-week history of daily vomiting, generalized weakness, mild confusion and some agitation. She has also noted some intermittent word finding difficulties. 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 She was not on antiplatelet therapy prior to admission. Her urine drug screen was positive for opiates, and amphetamines, and THC. The patient reported that she has been prescribed Adderall by Dr. Harrington Challenger a psychiatrist in [place]  for difficulty concentrating. A urine pregnancy test today was negative. She has been on Depo-Provera - a thromboembolic risk. She denies headache, palpitations, or visual problems. She reports both her mother and grandmother had strokes. Her grandmother had multiple strokes secondary to atrial fibrillation.  Date last known well: Unable to determine Time last known well: Unable to determine tPA Given: No: Late presentation  Past Medical History  Diagnosis Date  . Hypertension   . Asthma     Past Surgical History  Procedure Laterality Date  . No past surgeries      History reviewed. No pertinent family history. Social History:  reports that she has been smoking.  She does not have any smokeless tobacco history on file. She reports that she does not drink alcohol or use illicit drugs.  Allergies: No Known Allergies  Medications:  Scheduled: . aspirin EC  81 mg Oral Daily  . docusate sodium  100 mg Oral BID  . enoxaparin (LOVENOX) injection  40 mg Subcutaneous Daily  . feeding supplement  1 Container Oral BID BM  . nicotine  21 mg Transdermal Daily  . pantoprazole  40 mg Oral Daily    ROS: History obtained from the  patient  General ROS: negative for - chills, fatigue, fever, night sweats, weight gain. Positive for intentional weight loss. Psychological ROS: negative for - behavioral disorder, hallucinations, memory difficulties, mood swings or suicidal ideation Ophthalmic ROS: negative for - blurry vision, double vision, eye pain or loss of vision ENT ROS: negative for - epistaxis, nasal discharge, oral lesions, sore throat, tinnitus or vertigo Allergy and Immunology ROS: negative for - hives or itchy/watery eyes Hematological and Lymphatic ROS: negative for - bleeding problems, bruising or swollen lymph nodes Endocrine ROS: negative for - galactorrhea, hair pattern changes, polydipsia/polyuria or temperature intolerance Respiratory ROS: negative for - cough, hemoptysis, shortness of breath or wheezing Cardiovascular ROS: negative for - chest pain, dyspnea on exertion, edema or irregular heartbeat Gastrointestinal ROS: negative for - abdominal pain, diarrhea, hematemesis, or stool incontinence. Recent vomiting Genito-Urinary ROS: negative for - dysuria, hematuria, incontinence or urinary frequency/urgency Musculoskeletal ROS: negative for - joint swelling or muscular weakness. Positive for hands feeling tight. Neurological ROS: as noted in HPI Dermatological ROS: negative for rash and skin lesion changes   Physical Examination: Blood pressure 158/93, pulse 98, temperature 97.9 F (36.6 C), temperature source Oral, resp. rate 16, height 5' 1.5" (1.562 m), weight 75.751 kg (167 lb), SpO2 100 %.  General - somewhat distracted and restless 39 year old female in no acute distress Heart - Regular rate and rhythm - no murmer Lungs - Clear to auscultation but with decreased breath sounds throughout. Abdomen - Soft - non tender Extremities - Distal pulses intact - no edema Skin - Warm and dry  Mental Status:  Alert, oriented, thought content appropriate.  Speech fluent without evidence of aphasia.  Able to  follow 3 step commands without difficulty. Cranial Nerves: II: Visual fields grossly normal, pupils equal, round, reactive to light and accommodation III,IV, VI: ptosis not present, extra-ocular motions intact bilaterally V,VII: smile symmetric, facial light touch sensation normal bilaterally VIII: hearing normal bilaterally IX,X: gag reflex present XI: bilateral shoulder shrug XII: midline tongue extension Motor: Right : Upper extremity   5/5    Left:     Upper extremity   5/5  Lower extremity   5/5     Lower extremity   5/5 Tone and bulk:normal tone throughout; no atrophy noted Sensory: Light touch intact throughout, bilaterally Deep Tendon Reflexes: 2+ and symmetric throughout Plantars: Right: downgoing   Left: downgoing Cerebellar: normal finger-to-nose, normal rapid alternating movements and normal heel-to-shin test Gait: Deferred    Laboratory Studies:  Basic Metabolic Panel:  Recent Labs Lab 05/22/15 2240 05/23/15 0128 05/23/15 0230 05/23/15 0348  NA 132*  --   --  133*  K 2.6*  --   --  3.3*  CL 94*  --   --  96*  CO2 28  --   --  30  GLUCOSE 115*  --   --  97  BUN <5*  --   --  <5*  CREATININE 0.64  --  0.45 0.60  CALCIUM 9.0  --   --  8.9  MG  --  1.7  --  1.6*    Liver Function Tests:  Recent Labs Lab 05/22/15 2240 05/23/15 0348  AST 17 12*  ALT 15 14  ALKPHOS 68 63  BILITOT 0.3 0.2*  PROT 5.8* 5.6*  ALBUMIN 3.1* 3.0*    Recent Labs Lab 05/22/15 2240  LIPASE 35   No results for input(s): AMMONIA in the last 168 hours.  CBC:  Recent Labs Lab 05/22/15 2240 05/23/15 0230  WBC 8.6 8.5  HGB 13.1 12.3  HCT 38.3 35.7*  MCV 88.0 87.9  PLT 218 186    Cardiac Enzymes:  Recent Labs Lab 05/23/15 0230 05/23/15 0840  TROPONINI <0.03 <0.03    BNP: Invalid input(s): POCBNP  CBG: No results for input(s): GLUCAP in the last 168 hours.  Microbiology: Results for orders placed or performed during the hospital encounter of 02/07/07   Wet prep, genital     Status: Abnormal   Collection Time: 02/07/07  2:17 PM  Result Value Ref Range Status   Yeast Wet Prep HPF POC NONE SEEN  Final   Trich, Wet Prep NONE SEEN  Final   Clue Cells Wet Prep HPF POC FEW (A)  Final   WBC, Wet Prep HPF POC NONE SEEN  Final    Coagulation Studies: No results for input(s): LABPROT, INR in the last 72 hours.  Urinalysis:  Recent Labs Lab 05/22/15 2237  COLORURINE AMBER*  LABSPEC 1.027  PHURINE 6.0  GLUCOSEU NEGATIVE  HGBUR NEGATIVE  BILIRUBINUR NEGATIVE  KETONESUR 15*  PROTEINUR NEGATIVE  UROBILINOGEN 0.2  NITRITE NEGATIVE  LEUKOCYTESUR SMALL*    Lipid Panel: No results found for: CHOL, TRIG, HDL, CHOLHDL, VLDL, LDLCALC  HgbA1C: No results found for: HGBA1C  Urine Drug Screen:     Component Value Date/Time   LABOPIA POSITIVE* 05/23/2015 0120   COCAINSCRNUR NONE DETECTED 05/23/2015 0120   LABBENZ NONE DETECTED 05/23/2015 0120   AMPHETMU POSITIVE* 05/23/2015 0120   THCU POSITIVE* 05/23/2015 0120   LABBARB NONE DETECTED 05/23/2015 0120    Alcohol Level:  Recent Labs Lab 05/23/15 0129  ETH <5    Other results: EKG: Sinus rhythm rate 81 bpm. Please see the formal cardiology reading for complete details.  Imaging:  Mr Brain Wo Contrast 05/23/2015    Acute/subacute nonhemorrhagic infarcts posterior superior right frontal lobe and mid superior left frontal lobe. Questionable tiny left peri operculum infarct. Infarcts involve two vascular distributions and therefore cannot exclude embolic disease or result of underlying vasculitis. Appearance not typical for reversible encephalopathy syndrome. Result of encephalitis felt to be less likely consideration. No adjacent major dural sinus thrombosis.  Slightly decreased signal intensity of bone marrow may be related to combination of patient's habitus and anemia.  Partially empty non expanded sella without secondary findings of pseudotumor cerebri.      Assessment: 40 y.o.  female with history of substance abuse, tobacco use, hypertension, asthma, and Depo-Provera admitted today for evaluation of vomiting, mild confusion, agitation and recent word finding difficulties. An MRI was performed that was consistent with possible embolic strokes. Examination for the most part is unremarkable. Urine drug screen positive as noted above. No antiplatelet therapy prior to admission. Now on aspirin 81 mg daily. Family history of strokes and her mother and grandmother who had embolic strokes from atrial fibrillation.  Stroke Risk Factors - family history, hypertension, smoking and Depo-Provera  Plan: 1. HgbA1c, fasting lipid panel 2. CT angiogram of the head and neck 3. PT consult, OT consult, Speech consult 4. Echocardiogram 5. Carotid dopplers are not needed given the CT angiogram will be performed 6. Prophylactic therapy-Antiplatelet med: Aspirin - dose 81 mg daily 7. NPO until RN stroke swallow screen 8. Telemetry monitoring 9. Frequent neuro checks 10. Risk factor modification. 11. Consider vasculitis and hypercoagulable labs.  Mikey Bussing PA-C [facility] Pager [phone] 05/23/2015, 3:41 PM  I have seen and evaluated the patient. I have reviewed the above note and made appropriate changes.   39 year old female with likely embolic infarcts. Given her young age, prodromal symptoms vasculitis may need to be a consideration, but typically headaches are more prominent in this. She denies any thunderclap headache which would be concerning for RCVS. I think pursuing an embolic workup initially would be a prudent course.  Agree with recommendations above, in addition we'll check ESR, CRP.  Roland Rack, MD [facility] [phone]  If 7pm- 7am, please page neurology on call as listed in [place].

## 2015-05-23 NOTE — Evaluation (Signed)
Physical Therapy Evaluation Patient Details Name: Danielle Washington MRN: [id number] DOB: Nov 05, 1975 Today's Date: 05/23/2015   History of Present Illness  Pt adm for vomiting and confusioin.  Clinical Impression  Pt doing well with mobility and no further PT needed.  Ready for dc from PT standpoint.      Follow Up Recommendations No PT follow up    Equipment Recommendations  None recommended by PT    Recommendations for Other Services       Precautions / Restrictions Precautions Precautions: None      Mobility  Bed Mobility Overal bed mobility: Independent                Transfers Overall transfer level: Independent Equipment used: None                Ambulation/Gait Ambulation/Gait assistance: Independent Ambulation Distance (Feet): 500 Feet Assistive device: None Gait Pattern/deviations: WFL(Within Functional Limits)   Gait velocity interpretation: at or above normal speed for age/gender    Stairs            Wheelchair Mobility    Modified Rankin (Stroke Patients Only)       Balance Overall balance assessment: Independent                                           Pertinent Vitals/Pain Pain Assessment: No/denies pain    Home Living Family/patient expects to be discharged to:: Private residence                      Prior Function Level of Independence: Independent               Hand Dominance        Extremity/Trunk Assessment   Upper Extremity Assessment: Defer to OT evaluation           Lower Extremity Assessment: Overall WFL for tasks assessed         Communication   Communication: No difficulties  Cognition Arousal/Alertness: Awake/alert Behavior During Therapy: WFL for tasks assessed/performed Overall Cognitive Status: Within Functional Limits for tasks assessed                      General Comments      Exercises        Assessment/Plan    PT Assessment Patent  does not need any further PT services  PT Diagnosis Altered mental status   PT Problem List    PT Treatment Interventions     PT Goals (Current goals can be found in the Care Plan section) Acute Rehab PT Goals PT Goal Formulation: All assessment and education complete, DC therapy    Frequency     Barriers to discharge        Co-evaluation               End of Session   Activity Tolerance: Patient tolerated treatment well Patient left: in chair;with call bell/phone within reach Nurse Communication: Mobility status    Functional Assessment Tool Used: clinical judgement Functional Limitation: Mobility: Walking and moving around Mobility: Walking and Moving Around Current Status (V7846): 0 percent impaired, limited or restricted Mobility: Walking and Moving Around Goal Status (N6295): 0 percent impaired, limited or restricted Mobility: Walking and Moving Around Discharge Status (M8413): 0 percent impaired, limited or restricted    Time: 2440-1027 PT Time Calculation (  min) (ACUTE ONLY): 14 min   Charges:   PT Evaluation $Initial PT Evaluation Tier I: 1 Procedure     PT G Codes:   PT G-Codes **NOT FOR INPATIENT CLASS** Functional Assessment Tool Used: clinical judgement Functional Limitation: Mobility: Walking and moving around Mobility: Walking and Moving Around Current Status (J8841): 0 percent impaired, limited or restricted Mobility: Walking and Moving Around Goal Status (Y6063): 0 percent impaired, limited or restricted Mobility: Walking and Moving Around Discharge Status (K1601): 0 percent impaired, limited or restricted    [facility] 05/23/2015, 9:00 AM  [facility] PT [phone]

## 2015-05-23 NOTE — Progress Notes (Addendum)
Patient admitted after midnight. Chart reviewed. patient examined. Urine drug screen positive for opiates, THC, and amphetamine. Patient reports she is prescribed Adderall by Dr. Tenny Craw who is a psychiatrist in [place]. She took someone else's Vicodin "only one", and admits to smoking marijuana. Denies cocaine use currently and urine drug screen negative for cocaine. Blood alcohol level less than 5. MRI shows   Acute/subacute nonhemorrhagic infarcts posterior superior right frontal lobe and mid superior left frontal lobe. Questionable tiny left peri operculum infarct. Infarcts involve two vascular distributions and therefore cannot exclude embolic disease or result of underlying vasculitis.  Will continue aspirin, check echo, fasting lipids, hemoglobin A1c. Encouraged to quit smoking and using illicit substances. Patient reports she has Medicaid and goes to the health department and rocking him [place]. Discussed with Dr. Amada Jupiter who will consult. He recommends CT angiogram of the head and neck. Hold off on vasculitis workup otherwise for now. Will order speech therapy evaluation for cognitive and language. Patient has a normal neurologic exam currently and has no confusion. Patient reports expressive aphasia occasionally at home. Smokes a pack of cigarettes a day. No need for physical therapy and occupational therapy at this time. Place on telemetry.  Patient reports she also has a history of acid reflux and takes Prilosec daily. Requesting PPI. Will order.  Vomiting has resolved.  Time 40 minutes  Crista Curb, MD [facility]

## 2015-05-23 NOTE — ED Notes (Signed)
Pt ambulated to bathroom to give urine specimen 

## 2015-05-23 NOTE — ED Provider Notes (Signed)
CSN: [id number]     Arrival date & time 05/22/15  2149 History   First MD Initiated Contact with Patient 05/23/15 0003     Chief Complaint  Patient presents with  . Emesis     (Consider location/radiation/quality/duration/timing/severity/associated sxs/prior Treatment) HPI Patient started having vomiting 2 weeks ago. For the first week she apparently had only vomiting. This was happening every day. She denies there was associated fever or chills. There was no associated diarrhea or pain. Over the course of the second week she began developing confusion which is particularly notable at night time. Her companion reports that she's been getting very agitated and confused. The patient reports that she recognizes her confusion. She denies any history of alcohol used there is no drug use history she denies any prior medical history. There have been no new medications. They denied observing a similar type of episode. Up until approximately 2 weeks ago the patient was going to work regularly. The patient continues to build a self dress, self toilet and feed herself. Her family member however reports that they are doing everything as far as cooking for her and her increasing confusion and is making it more difficult for her to manage in her home. History reviewed. No pertinent past medical history. History reviewed. No pertinent past surgical history. No family history on file. Social History  Substance Use Topics  . Smoking status: Current Every Day Smoker  . Smokeless tobacco: None  . Alcohol Use: No   OB History    No data available     Review of Systems 10 Systems reviewed and are negative for acute change except as noted in the HPI.    Allergies  Review of patient's allergies indicates no known allergies.  Home Medications   Prior to Admission medications   Medication Sig Start Date End Date Taking? Authorizing Provider  ibuprofen (ADVIL,MOTRIN) 400 MG tablet Take 400 mg by mouth  every 6 (six) hours as needed for mild pain.   Yes Historical Provider, MD  medroxyPROGESTERone (DEPO-PROVERA) 150 MG/ML injection Inject 150 mg into the muscle every 3 (three) months.   Yes Historical Provider, MD   BP 117/69 mmHg  Pulse 84  Temp(Src) 98.9 F (37.2 C) (Oral)  Resp 18  SpO2 99% Physical Exam  Constitutional: She is oriented to person, place, and time. She appears well-developed and well-nourished.  Patient does not appear acutely ill. She does however appear slightly agitated with frequent hand movements and repositioning of the legs.   HENT:  Head: Normocephalic and atraumatic.  Right Ear: External ear normal.  Left Ear: External ear normal.  Nose: Nose normal.  Mouth/Throat: Oropharynx is clear and moist.  Bilateral TMs normal erythema no bulging  Eyes: Conjunctivae and EOM are normal. Pupils are equal, round, and reactive to light.  Neck: Neck supple. No thyromegaly present.  Cardiovascular: Normal rate, regular rhythm, normal heart sounds and intact distal pulses.   Pulmonary/Chest: Effort normal and breath sounds normal.  Abdominal: Soft. Bowel sounds are normal. She exhibits no distension. There is no tenderness.  Musculoskeletal: Normal range of motion. She exhibits no edema.  Neurological: She is alert and oriented to person, place, and time. She has normal strength. No cranial nerve deficit. She exhibits normal muscle tone. Coordination normal. GCS eye subscore is 4. GCS verbal subscore is 5. GCS motor subscore is 6.  Patient does have signs of general agitation with hand motions and poor eye contact. She is frequent repositioning of her legs. She  can follow commands. She does however seem to get easily distracted. He is very poor recall for simple events. Normal patellar DTRs  Skin: Skin is warm, dry and intact. No rash noted.  Psychiatric:  Distracted and mildly agitated.    ED Course  Procedures (including critical care time) Labs Review Labs Reviewed   COMPREHENSIVE METABOLIC PANEL - Abnormal; Notable for the following:    Sodium 132 (*)    Potassium 2.6 (*)    Chloride 94 (*)    Glucose, Bld 115 (*)    BUN <5 (*)    Total Protein 5.8 (*)    Albumin 3.1 (*)    All other components within normal limits  URINALYSIS, ROUTINE W REFLEX MICROSCOPIC (NOT AT [facility]) - Abnormal; Notable for the following:    Color, Urine AMBER (*)    APPearance CLOUDY (*)    Ketones, ur 15 (*)    Leukocytes, UA SMALL (*)    All other components within normal limits  URINE MICROSCOPIC-ADD ON - Abnormal; Notable for the following:    Squamous Epithelial / LPF MANY (*)    All other components within normal limits  LIPASE, BLOOD  CBC  TSH  T3, FREE  URINE RAPID DRUG SCREEN, HOSP PERFORMED  ETHANOL  POC URINE PREG, ED    Imaging Review No results found. I have personally reviewed and evaluated these images and lab results as part of my medical decision-making.   EKG Interpretation None      MDM   Final diagnoses:  Disorientation  Agitation  Hypokalemia  Non-intractable vomiting without nausea, vomiting of unspecified type    Patient presents with recurrent vomiting for 1 week followed by confusion and mental status change. At this point the patient is nontoxic in appearance. She does however appear to have disorientation and agitation of uncertain etiology. Labs do confirm hypokalemia and hyponatremia. The patient will be admitted for electrolyte imbalance with vomiting and mental status change for further diagnostic evaluation.   Arby Barrette, MD 05/23/15 [phone]

## 2015-05-23 NOTE — Progress Notes (Addendum)
Initial Nutrition Assessment  DOCUMENTATION CODES:   Obesity unspecified  INTERVENTION:   -Boost Breeze po BID, each supplement provides 250 kcal and 9 grams of protein  NUTRITION DIAGNOSIS:   Inadequate oral intake related to poor appetite, nausea as evidenced by meal completion < 50%.  GOAL:   Patient will meet greater than or equal to 90% of their needs  MONITOR:   PO intake, Supplement acceptance, Diet advancement, Labs, Weight trends, Skin, I & O's  REASON FOR ASSESSMENT:   Malnutrition Screening Tool    ASSESSMENT:   Patient started having vomiting 2 weeks ago. For the first week she apparently had only vomiting. This was happening every day. She denies there was associated fever or chills. There was no associated diarrhea or pain. Over the course of the second week she began developing confusion which is particularly notable at night time. Her companion reports that she's been getting very agitated and confused. The patient reports that she recognizes her confusion. She denies any history of alcohol used there is no drug use history she denies any prior medical history. There have been no new medications  Pt admitted with intractable nausea and vomiting.   Per MD notes, MRI revealed acute nonhemorrhagic infarcts of frontal lobe. Neuro and SLP consults pending.  Spoke with pt at bedside, who reports poor appetite and weight loss due to ongoing nausea and vomiting over the past 2 weeks. She reports UBW of 160# and endorses a 10# wt loss over the past 2 weeks. Wt verified on bedscale (167#). Suspect pt claims of weight loss may be inaccurate.   She reports that food sometimes feels like it is getting stuck when she tries to swallow. Pt consumed about 50% of her breakfast (50% of eggs and grits). Pt only consumed 2 glasses of orange juice and Coke off her lunch tray.   Toxicology positive for amphetamines, opiates, and tereahyrocannabinol.  Nutrition-Focused physical exam  completed. Findings are no fat depletion, no muscle depletion, and no edema.   Labs reviewed: Na: 133, K: 3.3, Mg: 1.6.   Diet Order:  Diet Heart Room service appropriate?: Yes; Fluid consistency:: Thin  Skin:  Reviewed, no issues  Last BM:  05/21/15  Height:   Ht Readings from Last 1 Encounters:  05/23/15 5' 1.5" (1.562 m)    Weight:   Wt Readings from Last 1 Encounters:  05/23/15 167 lb (75.751 kg)    Ideal Body Weight:  49.1 kg  BMI:  Body mass index is 31.05 kg/(m^2).  Estimated Nutritional Needs:   Kcal:  1500-1700  Protein:  75-85 grams  Fluid:  1.5-1.7 L  EDUCATION NEEDS:   No education needs identified at this time  Domitila Stetler A. Mayford Knife, RD, LDN, CDE Pager: [phone] After hours Pager: [phone]

## 2015-05-24 ENCOUNTER — Inpatient Hospital Stay (HOSPITAL_COMMUNITY): Payer: Medicaid Other

## 2015-05-24 DIAGNOSIS — F1910 Other psychoactive substance abuse, uncomplicated: Secondary | ICD-10-CM

## 2015-05-24 DIAGNOSIS — Z720 Tobacco use: Secondary | ICD-10-CM

## 2015-05-24 DIAGNOSIS — K219 Gastro-esophageal reflux disease without esophagitis: Secondary | ICD-10-CM

## 2015-05-24 DIAGNOSIS — I10 Essential (primary) hypertension: Secondary | ICD-10-CM

## 2015-05-24 LAB — LIPID PANEL
CHOL/HDL RATIO: 3.7 ratio
Cholesterol: 151 mg/dL (ref 0–200)
HDL: 41 mg/dL (ref >40)
LDL CALC: 98 mg/dL (ref 0–99)
TRIGLYCERIDES: 61 mg/dL (ref <150)
VLDL: 12 mg/dL (ref 0–40)

## 2015-05-24 LAB — T3, FREE: T3, Free: 4.2 pg/mL (ref 2.0–4.4)

## 2015-05-24 LAB — SEDIMENTATION RATE: Sed Rate: 3 mm/hr (ref 0–22)

## 2015-05-24 MED ORDER — PERFLUTREN LIPID MICROSPHERE
1.0000 mL | INTRAVENOUS | Status: AC | PRN
  Administered 2015-05-24: 3 mL via INTRAVENOUS
  Filled 2015-05-24: qty 10

## 2015-05-24 MED ORDER — STROKE: EARLY STAGES OF RECOVERY BOOK
Freq: Once | Status: AC
  Administered 2015-05-24: 23:00:00
  Filled 2015-05-24: qty 1

## 2015-05-24 MED ORDER — ATORVASTATIN CALCIUM 20 MG PO TABS
20.0000 mg | ORAL_TABLET | Freq: Every day | ORAL | Status: DC
  Administered 2015-05-24: 20 mg via ORAL
  Filled 2015-05-24: qty 1

## 2015-05-24 MED ORDER — INFLUENZA VAC SPLIT QUAD 0.5 ML IM SUSY
0.5000 mL | PREFILLED_SYRINGE | Freq: Once | INTRAMUSCULAR | Status: AC
  Administered 2015-05-24: 0.5 mL via INTRAMUSCULAR
  Filled 2015-05-24: qty 0.5

## 2015-05-24 MED ORDER — PNEUMOCOCCAL VAC POLYVALENT 25 MCG/0.5ML IJ INJ
0.5000 mL | INJECTION | INTRAMUSCULAR | Status: DC
  Filled 2015-05-24: qty 0.5

## 2015-05-24 NOTE — Progress Notes (Signed)
PROGRESS NOTE  Danielle Washington ZOX:[id number] DOB: 11-30-1975 DOA: 05/22/2015 PCP: No primary care provider on file.   HPI: 39 yo F admitted on 10/1 with nausea/vomiting, intermittent altered mental status per family, with MRI showing a CVA. Neurology consulted  Subjective / 24 H Interval events - asking to go home - no chest pain, shortness of breath, no abdominal pain, nausea or vomiting.   Assessment/Plan: Principal Problem:   Acute ischemic stroke (HCC) Active Problems:   Nausea and vomiting   Substance abuse   Tobacco abuse   GERD (gastroesophageal reflux disease)   Stroke (cerebrum) (HCC)   CVA - MRI with acute non hemorrhagic infarcts, neurology following, will pursue a TEE, discussed with Delton See today, he will call cardiology  - CT angio neck without significant stenosis - 2D echo with normal EF 65-70%, no WMA - LDL 98 - continue Aspirin   Nausea/vomiting - resolved with supportive measures, possibly transient gastroenteritis  Polysubstance abuse - counseled for cessation  GERD - continue PPI   Diet: Diet Heart Room service appropriate?: Yes; Fluid consistency:: Thin Fluids: none DVT Prophylaxis: Lovenox  Code Status: Full Code Family Communication: no family bedside  Disposition Plan: TEE tomorrow, possible home d/c afterwards  Consultants:  Neurology  Procedures:  2D echo   Antibiotics  Anti-infectives    None       Studies  Ct Angio Head W/cm &/or Wo Cm  05/23/2015   CLINICAL DATA:  39 year old hypertensive female with confusion. Subsequent encounter.  EXAM: CT ANGIOGRAPHY HEAD AND NECK  TECHNIQUE: Multidetector CT imaging of the head and neck was performed using the standard protocol during bolus administration of intravenous contrast. Multiplanar CT image reconstructions and MIPs were obtained to evaluate the vascular anatomy. Carotid stenosis measurements (when applicable) are obtained utilizing NASCET criteria, using the distal  internal carotid diameter as the denominator.  CONTRAST:  50mL OMNIPAQUE IOHEXOL 350 MG/ML SOLN  COMPARISON:  05/23/2015 brain MR.  FINDINGS: CT HEAD  Brain: Bilateral frontal lobe infarcts better visualized on recent MR. No intracranial hemorrhage. No intracranial enhancing lesion. No hydrocephalus.  Calvarium and skull base: Negative.  Paranasal sinuses: Clear.  Orbits: Negative.  CTA NECK  Aortic arch: 3 vessel aortic arch.  Right carotid system: No significant stenosis. Ectatic right internal carotid artery.  Left carotid system: No significant stenosis. Ectatic left internal carotid artery.  Vertebral arteries:No high-grade stenosis of the vertebral arteries. Irregular appearance of portions of the vertebral arteries may be related to motion artifact rather than fibromuscular dysplasia.  Skeleton: No osseous abnormality.  Other neck: Mild cervical spondylotic changes most notable C5-6 and C6-7. Prominent adenoidal tissue. Lung apices without worrisome abnormality.  CTA HEAD  Anterior circulation: Mild narrowing cavernous segment internal carotid artery bilaterally. Mild to moderate narrowing and irregularity of the middle cerebral artery and anterior cerebral artery bilaterally.  Posterior circulation: Narrowing distal vertebral arteries. Narrowed irregular basilar artery. Narrowing posterior cerebral arteries bilaterally.  Venous sinuses: Negative.  Anatomic variants: Negative.  Delayed phase: Negative.  IMPRESSION: CTA HEAD  Mild narrowing cavernous segment internal carotid artery bilaterally.  Mild to moderate narrowing and irregularity of the middle cerebral artery and anterior cerebral artery bilaterally.  Narrowing distal vertebral arteries. Narrowed irregular basilar artery. Narrowing posterior cerebral arteries bilaterally.  Findings may represent result of hypertension/atherosclerosis although underlying vasculitis not excluded. Spasm is a secondary less likely consideration.  CTA NECK  No evidence the  hemodynamically significant stenosis of either carotid bifurcation.  CT HEAD  Bilateral frontal  lobe infarcts better visualized on recent MR.   Electronically Signed   By: Lacy Duverney M.D.   On: 05/23/2015 16:56   Ct Angio Neck W/cm &/or Wo/cm  05/23/2015   CLINICAL DATA:  39 year old hypertensive female with confusion. Subsequent encounter.  EXAM: CT ANGIOGRAPHY HEAD AND NECK  TECHNIQUE: Multidetector CT imaging of the head and neck was performed using the standard protocol during bolus administration of intravenous contrast. Multiplanar CT image reconstructions and MIPs were obtained to evaluate the vascular anatomy. Carotid stenosis measurements (when applicable) are obtained utilizing NASCET criteria, using the distal internal carotid diameter as the denominator.  CONTRAST:  50mL OMNIPAQUE IOHEXOL 350 MG/ML SOLN  COMPARISON:  05/23/2015 brain MR.  FINDINGS: CT HEAD  Brain: Bilateral frontal lobe infarcts better visualized on recent MR. No intracranial hemorrhage. No intracranial enhancing lesion. No hydrocephalus.  Calvarium and skull base: Negative.  Paranasal sinuses: Clear.  Orbits: Negative.  CTA NECK  Aortic arch: 3 vessel aortic arch.  Right carotid system: No significant stenosis. Ectatic right internal carotid artery.  Left carotid system: No significant stenosis. Ectatic left internal carotid artery.  Vertebral arteries:No high-grade stenosis of the vertebral arteries. Irregular appearance of portions of the vertebral arteries may be related to motion artifact rather than fibromuscular dysplasia.  Skeleton: No osseous abnormality.  Other neck: Mild cervical spondylotic changes most notable C5-6 and C6-7. Prominent adenoidal tissue. Lung apices without worrisome abnormality.  CTA HEAD  Anterior circulation: Mild narrowing cavernous segment internal carotid artery bilaterally. Mild to moderate narrowing and irregularity of the middle cerebral artery and anterior cerebral artery bilaterally.   Posterior circulation: Narrowing distal vertebral arteries. Narrowed irregular basilar artery. Narrowing posterior cerebral arteries bilaterally.  Venous sinuses: Negative.  Anatomic variants: Negative.  Delayed phase: Negative.  IMPRESSION: CTA HEAD  Mild narrowing cavernous segment internal carotid artery bilaterally.  Mild to moderate narrowing and irregularity of the middle cerebral artery and anterior cerebral artery bilaterally.  Narrowing distal vertebral arteries. Narrowed irregular basilar artery. Narrowing posterior cerebral arteries bilaterally.  Findings may represent result of hypertension/atherosclerosis although underlying vasculitis not excluded. Spasm is a secondary less likely consideration.  CTA NECK  No evidence the hemodynamically significant stenosis of either carotid bifurcation.  CT HEAD  Bilateral frontal lobe infarcts better visualized on recent MR.   Electronically Signed   By: Lacy Duverney M.D.   On: 05/23/2015 16:56   Mr Brain Wo Contrast  05/23/2015   CLINICAL DATA:  39 year old hypertensive female off medication for 8 months presenting with recurrent vomiting, confusion mental status changes over the past week. Initial encounter.  EXAM: MRI HEAD WITHOUT CONTRAST  TECHNIQUE: Multiplanar, multiecho pulse sequences of the brain and surrounding structures were obtained without intravenous contrast.  COMPARISON:  None.  FINDINGS: Acute/subacute nonhemorrhagic infarcts posterior superior right frontal lobe and mid superior left frontal lobe. Questionable tiny left peri operculum infarct. Infarcts involve two vascular distributions and therefore cannot exclude embolic disease or result of underlying vasculitis. Appearance not typical for reversible encephalopathy syndrome. Result of encephalitis felt to be less likely consideration. No adjacent major dural sinus thrombosis.  No intracranial hemorrhage.  No intracranial mass lesion noted on this unenhanced exam.  No hydrocephalus.  Major  intracranial vascular structures appear patent.  Slightly decreased signal intensity of bone marrow may be related to combination of patient's habitus and anemia.  Mild prominence adenoidal tissue.  Partially empty non expanded sella without secondary findings of pseudotumor cerebri  Cervical medullary junction and pineal region unremarkable.  IMPRESSION: Acute/subacute nonhemorrhagic infarcts posterior superior right frontal lobe and mid superior left frontal lobe. Questionable tiny left peri operculum infarct. Infarcts involve two vascular distributions and therefore cannot exclude embolic disease or result of underlying vasculitis. Appearance not typical for reversible encephalopathy syndrome. Result of encephalitis felt to be less likely consideration. No adjacent major dural sinus thrombosis.  Slightly decreased signal intensity of bone marrow may be related to combination of patient's habitus and anemia.  Partially empty non expanded sella without secondary findings of pseudotumor cerebri.   Electronically Signed   By: Lacy Duverney M.D.   On: 05/23/2015 08:43    Objective  Filed Vitals:   05/24/15 0513 05/24/15 0514 05/24/15 0516 05/24/15 1000  BP: 113/76 142/94 134/78 142/80  Pulse: 106 122 157 107  Temp: 98.1 F (36.7 C)   98.7 F (37.1 C)  TempSrc: Oral   Oral  Resp: Height:      Weight:      SpO2: 97% 93% 97% 97%    Intake/Output Summary (Last 24 hours) at 05/24/15 1157 Last data filed at 05/24/15 0900  Gross per 24 hour  Intake   1010 ml  Output      0 ml  Net   1010 ml   Filed Weights   05/23/15 1314  Weight: 75.751 kg (167 lb)    Exam:  GENERAL: NAD  HEENT: head NCAT, no scleral icterus.   NECK: Supple.   LUNGS: Clear to auscultation. No wheezing or crackles  HEART: Regular rate and rhythm without murmur. 2+ pulses, no JVD, no peripheral edema  ABDOMEN: Soft, nontender, and nondistended. Positive bowel sounds.  NEUROLOGIC: non focal   Data  Reviewed: Basic Metabolic Panel:  Recent Labs Lab 05/22/15 2240 05/23/15 0128 05/23/15 0230 05/23/15 0348  NA 132*  --   --  133*  K 2.6*  --   --  3.3*  CL 94*  --   --  96*  CO2 28  --   --  30  GLUCOSE 115*  --   --  97  BUN <5*  --   --  <5*  CREATININE 0.64  --  0.45 0.60  CALCIUM 9.0  --   --  8.9  MG  --  1.7  --  1.6*   Liver Function Tests:  Recent Labs Lab 05/22/15 2240 05/23/15 0348  AST 17 12*  ALT 15 14  ALKPHOS 68 63  BILITOT 0.3 0.2*  PROT 5.8* 5.6*  ALBUMIN 3.1* 3.0*    Recent Labs Lab 05/22/15 2240  LIPASE 35   CBC:  Recent Labs Lab 05/22/15 2240 05/23/15 0230  WBC 8.6 8.5  HGB 13.1 12.3  HCT 38.3 35.7*  MCV 88.0 87.9  PLT 218 186   Cardiac Enzymes:  Recent Labs Lab 05/23/15 0230 05/23/15 0840  TROPONINI <0.03 <0.03   BNP (last 3 results)  Recent Labs  05/23/15 0230  BNP 14.2     Scheduled Meds: . aspirin EC  81 mg Oral Daily  . docusate sodium  100 mg Oral BID  . enoxaparin (LOVENOX) injection  40 mg Subcutaneous Daily  . feeding supplement  1 Container Oral BID BM  . nicotine  21 mg Transdermal Daily  . pantoprazole  40 mg Oral Daily  . [START ON 05/25/2015] pneumococcal 23 valent vaccine  0.5 mL Intramuscular Tomorrow-1000  . traZODone  50 mg Oral QHS   Continuous Infusions:   Pamella Pert, MD [facility] Pager  [phone]. If 7 PM - 7 AM, please contact night-coverage at [email or web address], password [email or web address] 05/24/2015, 11:57 AM  LOS: 1 day

## 2015-05-24 NOTE — Progress Notes (Signed)
STROKE TEAM PROGRESS NOTE  HPI Ndea Kilroy is a 39 y.o. female with a history of substance abuse (cocaine in the past), tobacco use, hypertension, and asthma admitted to [facility] today with a two-week history of daily vomiting, generalized weakness, mild confusion and some agitation. She has also noted some intermittent word finding difficulties. 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 She was not on antiplatelet therapy prior to admission. Her urine drug screen was positive for opiates, and amphetamines, and THC. The patient reported that she has been prescribed Adderall by Dr. Tenny Craw a psychiatrist in [place] for difficulty concentrating. A urine pregnancy test today was negative. She has been on Depo-Provera - a thromboembolic risk. She denies headache, palpitations, or visual problems. She reports both her mother and grandmother had strokes. Her grandmother had multiple strokes secondary to atrial fibrillation.  Date last known well: Unable to determine Time last known well: Unable to determine tPA Given: No: Late presentation    SUBJECTIVE (INTERVAL HISTORY) No family members present. The patient is feeling much better today. Her speech difficulties have resolved. Smoking cessation was strongly recommended.   OBJECTIVE Temp:  [97.9 F (36.6 C)-98.4 F (36.9 C)] 98.1 F (36.7 C) (10/02 0513) Pulse Rate:  [98-157] 157 (10/02 0516) Cardiac Rhythm:  [-] Normal sinus rhythm (10/01 1932) Resp:  [16-18] 18 (10/02 0516) BP: (113-158)/(76-95) 134/78 mmHg (10/02 0516) SpO2:  [93 %-100 %] 97 % (10/02 0516) Weight:  [75.751 kg (167 lb)] 75.751 kg (167 lb) (10/01 1314)  CBC:  Recent Labs Lab 05/22/15 2240 05/23/15 0230  WBC 8.6 8.5  HGB 13.1 12.3  HCT 38.3 35.7*  MCV 88.0 87.9  PLT 218 186    Basic Metabolic Panel:  Recent Labs Lab 05/22/15 2240 05/23/15 0128  05/23/15 0230 05/23/15 0348  NA 132*  --   --  133*  K 2.6*  --   --  3.3*  CL 94*  --   --  96*  CO2 28  --   --  30  GLUCOSE 115*  --   --  97  BUN <5*  --   --  <5*  CREATININE 0.64  --  0.45 0.60  CALCIUM 9.0  --   --  8.9  MG  --  1.7  --  1.6*    Lipid Panel:    Component Value Date/Time   CHOL 151 05/24/2015 0550   TRIG 61 05/24/2015 0550   HDL 41 05/24/2015 0550   CHOLHDL 3.7 05/24/2015 0550   VLDL 12 05/24/2015 0550   LDLCALC 98 05/24/2015 0550   HgbA1c: No results found for: HGBA1C Urine Drug Screen:    Component Value Date/Time   LABOPIA POSITIVE* 05/23/2015 0120   COCAINSCRNUR NONE DETECTED 05/23/2015 0120   LABBENZ NONE DETECTED 05/23/2015 0120   AMPHETMU POSITIVE* 05/23/2015 0120   THCU POSITIVE* 05/23/2015 0120   LABBARB NONE DETECTED 05/23/2015 0120      IMAGING  Ct Angio Head W/cm &/or Wo Cm 05/23/2015     CTA HEAD   Mild narrowing cavernous segment internal carotid artery bilaterally.  Mild to moderate narrowing and irregularity of the middle cerebral artery and anterior cerebral artery bilaterally.  Narrowing distal vertebral arteries. Narrowed irregular basilar artery. Narrowing posterior cerebral arteries bilaterally.  Findings may represent result of hypertension/atherosclerosis although underlying vasculitis not excluded. Spasm is a secondary less likely consideration.    CTA NECK  No evidence the hemodynamically significant stenosis of either carotid bifurcation.    CT HEAD  Bilateral frontal lobe infarcts better visualized on recent MR.       Mr Brain Wo Contrast 05/23/2015    Acute/subacute nonhemorrhagic infarcts posterior superior right frontal lobe and mid superior left frontal lobe. Questionable tiny left peri operculum infarct. Infarcts involve two vascular distributions and therefore cannot exclude embolic disease or result of underlying vasculitis. Appearance not typical for reversible encephalopathy syndrome. Result of encephalitis  felt to be less likely consideration. No adjacent major dural sinus thrombosis.  Slightly decreased signal intensity of bone marrow may be related to combination of patient's habitus and anemia.  Partially empty non expanded sella without secondary findings of pseudotumor cerebri.       PHYSICAL EXAM Pleasant young obese Caucasian lady not in distress. . Afebrile. Head is nontraumatic. Neck is supple without bruit.    Cardiac exam no murmur or gallop. Lungs are clear to auscultation. Distal pulses are well felt.  Neurological Exam ;  Awake  Alert oriented x 3. Normal speech and language.eye movements .very occasional word finding difficulty full without nystagmus.fundi were not visualized. Vision acuity and fields appear normal. Hearing is normal. Palatal movements are normal. Face symmetric. Tongue midline. Normal strength, tone, reflexes and coordination. Normal sensation. Gait deferred.    ASSESSMENT/PLAN Ms. Zilla Shartzer is a 39 y.o. female with history of tobacco use, substance abuse, hypertension, and asthma presenting with vomiting, mild confusion, mild agitation, and word finding difficulties.. She did not receive IV t-PA due to late presentation.   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  Resultant resolution of deficits  MRI - Acute/subacute nonhemorrhagic infarcts bilateral frontal lobes.  MRA  not performed  CTA of head and neck - no high-grade stenosis. Question vasculitis or spasm.  Carotid Doppler  refer to CTA of the neck  2D Echo EF 65-70%. No cardiac source of emboli identified.  LDL 98  HgbA1c pending  VTE prophylaxis - Lovenox  Diet Heart Room service appropriate?: Yes; Fluid consistency:: Thin  no antithrombotic prior to admission, now on aspirin 81 mg orally every day  Patient counseled to be compliant with her antithrombotic medications  Ongoing aggressive stroke risk factor management  Therapy recommendations:  No  follow-up physical therapy  Disposition: Pending  Hypertension  Stable  Permissive hypertension (OK if < 220/120) but gradually normalize in 5-7 days  Hyperlipidemia  Home meds:  No lipid lowering medications prior to admission.  LDL 98, goal < 70  Add Lipitor 20 mg daily  Continue statin at discharge   Other Stroke Risk Factors   Cigarette smoker, quit smoking advised to stop smoking  Obesity, Body mass index is 31.05 kg/(m^2).   Family hx stroke (mother and grandmother)  Substance abuse history  Depo-Provera use   Other Active Problems  UDS positive for Amphetamines (Adderall), THC, and opiates  Mild hypokalemia, mild hyponatremia, mild hypomagnesemia.   PLAN  TEE tomorrow - no loop - cardiology notified  Vasculitis labs  Hospital day # 1  Delton See PA-C [facility] Pager [phone] 05/24/2015, 4:50 PM I have personally examined this patient, reviewed notes, independently viewed imaging studies, participated in medical decision making and plan of care. I have made any additions or clarifications directly to the above note. Agree with note above. She presented with word finding difficulties and confusion secondary to bifrontal embolic infarcts in etiology to be determined . She is at risk  for recurrent strokes, TIAs, neurological worsening and needs ongoing stroke evaluation and aggressive risk factor modification. I have counseled the patient to quit smoking cigarettes as well as marijuana. Check hypercoagulable panel labs, vasculitis labs, TEE. Continue aspirin for now.  Delia Heady, MD Medical Director [facility] Pager: [phone] 05/24/2015 5:37 PM     To contact Stroke Continuity provider, please refer to [email or web address]. After hours, contact General Neurology

## 2015-05-24 NOTE — Progress Notes (Signed)
  Echocardiogram 2D Echocardiogram with Definity has been performed.  Danielle Washington 05/24/2015, 10:27 AM

## 2015-05-25 LAB — HEMOGLOBIN A1C
HEMOGLOBIN A1C: 5.8 % — AB (ref 4.8–5.6)
Hgb A1c MFr Bld: 5.8 % — ABNORMAL HIGH (ref 4.8–5.6)
MEAN PLASMA GLUCOSE: 120 mg/dL
Mean Plasma Glucose: 120 mg/dL

## 2015-05-25 LAB — CBC
HCT: 42.9 % (ref 36.0–46.0)
HEMOGLOBIN: 14.5 g/dL (ref 12.0–15.0)
MCH: 30.3 pg (ref 26.0–34.0)
MCHC: 33.8 g/dL (ref 30.0–36.0)
MCV: 89.6 fL (ref 78.0–100.0)
Platelets: 250 10*3/uL (ref 150–400)
RBC: 4.79 MIL/uL (ref 3.87–5.11)
RDW: 14.4 % (ref 11.5–15.5)
WBC: 7.6 10*3/uL (ref 4.0–10.5)

## 2015-05-25 LAB — COMPREHENSIVE METABOLIC PANEL
ALBUMIN: 3.2 g/dL — AB (ref 3.5–5.0)
ALK PHOS: 63 U/L (ref 38–126)
ALT: 14 U/L (ref 14–54)
ANION GAP: 9 (ref 5–15)
AST: 14 U/L — ABNORMAL LOW (ref 15–41)
BILIRUBIN TOTAL: 0.3 mg/dL (ref 0.3–1.2)
BUN: <5 mg/dL — ABNORMAL LOW (ref 6–20)
CALCIUM: 9.2 mg/dL (ref 8.9–10.3)
CO2: 24 mmol/L (ref 22–32)
Chloride: 99 mmol/L — ABNORMAL LOW (ref 101–111)
Creatinine, Ser: 0.54 mg/dL (ref 0.44–1.00)
GFR calc Af Amer: >60 mL/min (ref >60)
GLUCOSE: 113 mg/dL — AB (ref 65–99)
POTASSIUM: 3.7 mmol/L (ref 3.5–5.1)
Sodium: 132 mmol/L — ABNORMAL LOW (ref 135–145)
TOTAL PROTEIN: 6.4 g/dL — AB (ref 6.5–8.1)

## 2015-05-25 LAB — PHOSPHORUS: PHOSPHORUS: 3.6 mg/dL (ref 2.5–4.6)

## 2015-05-25 LAB — MAGNESIUM: MAGNESIUM: 1.9 mg/dL (ref 1.7–2.4)

## 2015-05-25 LAB — C3 COMPLEMENT: C3 COMPLEMENT: 133 mg/dL (ref 82–167)

## 2015-05-25 LAB — ANTINUCLEAR ANTIBODIES, IFA: ANTINUCLEAR ANTIBODIES, IFA: NEGATIVE

## 2015-05-25 LAB — C4 COMPLEMENT: COMPLEMENT C4, BODY FLUID: 23 mg/dL (ref 14–44)

## 2015-05-25 LAB — RPR: RPR Ser Ql: NONREACTIVE

## 2015-05-25 MED ORDER — ASPIRIN 81 MG PO TBEC: 81.0000 mg | DELAYED_RELEASE_TABLET | Freq: Every day | ORAL | Status: DC

## 2015-05-25 MED ORDER — ATORVASTATIN CALCIUM 20 MG PO TABS: 20.0000 mg | ORAL_TABLET | Freq: Every day | ORAL | Status: DC

## 2015-05-25 NOTE — Progress Notes (Signed)
    [facility] has been requested to perform a transesophageal echocardiogram on Danielle Washington  for stroke.  After careful review of history and examination, the risks and benefits of transesophageal echocardiogram have been explained including risks of esophageal damage, perforation (1:10,000 risk), bleeding, pharyngeal hematoma as well as other potential complications associated with conscious sedation including aspiration, arrhythmia, respiratory failure and death. Alternatives to treatment were discussed, questions were answered. Patient is willing to proceed.  She does not wish to stay overnight and wait for the procedure and has threatened to leave AMA. She voices understanding of the importance of the procedure and is willing to come back to the hospital for the procedure which is scheduled for 05/26/2015 at 11:00 with Dr. Mayford Knife. If she does leave AMA or is discharged today, she will need to report to Admitting at [facility] on 05/26/2015 at 9:45AM.  BP has been 128/88 - 158/99 in the past 24 hours. Hgb stable at 14.5. Platelets at 250.  Ellsworth Lennox, PA-C 05/25/2015 11:10 AM

## 2015-05-25 NOTE — Care Management Note (Signed)
Case Management Note  Patient Details  Name: Blanka Rockholt MRN: [id number] Date of Birth: 1976/04/12  Subjective/Objective:       Date: 05/25/15 Spoke with patient at the bedside. Introduced self as Sports coach and explained role in discharge planning and how to be reached. Verified patient lives in town, alone with  Her two sons,. Expressed no  potential need for no other DME patient is indep. Verified patient anticipates to go home with family at time of discharge and will have full-time supervision by family at this time to best of their knowledge. Patient  denied needing help with their medication. Patient drives   to MD appointments. Verified patient has PCP [facility]. Patient has transportation home.  Plan: CM will continue to follow for discharge planning and [facility] resources.              Action/Plan:   Expected Discharge Date:                  Expected Discharge Plan:  Home/Self Care  In-House Referral:     Discharge planning Services  CM Consult  Post Acute Care Choice:    Choice offered to:     DME Arranged:    DME Agency:     HH Arranged:    HH Agency:     Status of Service:  Completed, signed off  Medicare Important Message Given:    Date Medicare IM Given:    Medicare IM give by:    Date Additional Medicare IM Given:    Additional Medicare Important Message give by:     If discussed at [place] Length of Stay Meetings, dates discussed:    Additional Comments:  Leone Haven, RN 05/25/2015, 12:16 PM

## 2015-05-25 NOTE — Evaluation (Signed)
Occupational Therapy Evaluation Patient Details Name: Danielle [place] MRN: [id number] DOB: 12/10/1975 Today's Date: 05/25/2015    History of Present Illness Pt adm for vomiting and confusioin. "I couldn't find my words"   Clinical Impression   Pt is a 39 y/o female whom presented to hospital w/ c/o confusion and AMS. She is currently independent w/ ADL's, IADL's and functional mobility. Bilateral UE strength was grossly 5/5 throughout. She reports that she is at her baseline and has no further acute OT needs at this time, will sign off.    Follow Up Recommendations       Equipment Recommendations       Recommendations for Other Services       Precautions / Restrictions Precautions Precautions: None Restrictions Weight Bearing Restrictions: No      Mobility Bed Mobility Overal bed mobility: Independent                Transfers Overall transfer level: Independent Equipment used: None                  Balance Overall balance assessment: Independent                                          ADL Overall ADL's : Independent                                       General ADL Comments: Pt is independent functional mobility and tranfers throughout room, to/from chair, on/off commode and also states that she took a shower yesterday w/o assistance. Pt feels as though she is at baseline. Bilateral UE strength is grossly 5/5 throughout as per MMT     Vision  No changes from baseline. No visual deficits noted.   Perception     Praxis      Pertinent Vitals/Pain Pain Assessment: No/denies pain     Hand Dominance Right   Extremity/Trunk Assessment Upper Extremity Assessment Upper Extremity Assessment: Overall WFL for tasks assessed   Lower Extremity Assessment Lower Extremity Assessment: Defer to PT evaluation       Communication Communication Communication: No difficulties   Cognition Arousal/Alertness:  Awake/alert Behavior During Therapy: WFL for tasks assessed/performed Overall Cognitive Status: Within Functional Limits for tasks assessed                     General Comments       Exercises       Shoulder Instructions      Home Living Family/patient expects to be discharged to:: Private residence Living Arrangements: Children                                      Prior Functioning/Environment Level of Independence: Independent             OT Diagnosis:     OT Problem List:     OT Treatment/Interventions:      OT Goals(Current goals can be found in the care plan section)    OT Frequency:     Barriers to D/C:            Co-evaluation              End of Session  Nurse Communication: Other (comment) (Pt currently NPO awaiting testing and is agitated  "I'm hungry, thristy and I'm about to get up out of here and leave")  Activity Tolerance: Patient tolerated treatment well Patient left: in chair;with call bell/phone within reach   Time: 1610-9604 OT Time Calculation (min): 11 min Charges:  OT General Charges $OT Visit: 1 Procedure OT Evaluation $Initial OT Evaluation Tier I: 1 Procedure G-Codes: OT G-codes **NOT FOR INPATIENT CLASS** Functional Assessment Tool Used: clinical judgement Functional Limitation: Self care Self Care Current Status (V4098): 0 percent impaired, limited or restricted Self Care Goal Status (J1914): 0 percent impaired, limited or restricted Self Care Discharge Status (N8295): 0 percent impaired, limited or restricted  Alm Bustard, OTR/L 05/25/2015, 9:47 AM

## 2015-05-25 NOTE — Progress Notes (Signed)
Pt. Educated about having outpatient TEE procedure tomorrow morning. Patient educated about discharge information, such as new medications and signs/symptoms of stroke, and how to prevent future stroke. Pt. Verbalizes understanding and eager to leave. Pt. Discharged to home.   Dorathy Daft, RN

## 2015-05-25 NOTE — Progress Notes (Signed)
Pharmacist Provided - Patient Medication Education Prior to Discharge   Danielle [place] is an 39 y.o. female who presented to [facility] on 05/22/2015 with a chief complaint of  Chief Complaint  Patient presents with  . Emesis      Patient will be discharged with 2 new medications  Patient being discharged without any new medications  The following medications were discussed with the patient: ASA and Atorvastatin  Pain Control medications:  Yes     No  Diabetes Medications:  Yes     No  Heart Failure Medications:  Yes     No  Anticoagulation Medications:   Yes     No  Antibiotics at discharge:  Yes     No  Assessment: Reviewed new medications with the patient and what each medication was for, enforcing the importance of the new statin. Also answered patient questions about medication SE.  Time spent preparing for discharge counseling: Time spent counseling patient:  Remi Haggard, PharmD Clinical Pharmacist- Resident Pager: [phone]  Remi Haggard, PharmD 05/25/2015, 1:09 PM

## 2015-05-25 NOTE — Progress Notes (Signed)
Pt stated that she did not want anyone in her room for the rest of the night. Pt stated "She did not want to be bothered." RN was able to complete  shift NIH scale and one neuro exam.  Stroke education at bedside and pt is sleeping.  RN will continue to monitor. Lesly Dukes, RN

## 2015-05-25 NOTE — Discharge Summary (Signed)
Physician Discharge Summary  Danielle [place] ZOX:[id number] DOB: 1975-10-02 DOA: 05/22/2015  PCP: No primary care provider on file.  Admit date: 05/22/2015 Discharge date: 05/25/2015  Time spent: greater than 30 minutes  Recommendations for Outpatient Follow-up:  1. Follow up hypercoabuable panel 2. For TEE tomorrow  Discharge Diagnoses:  Principal Problem:   Acute ischemic stroke [facility]) Active Problems:   Nausea and vomiting   Substance abuse   Tobacco abuse   GERD (gastroesophageal reflux disease)   Discharge Condition: stable  Diet recommendation: heart healthy  Filed Weights   05/23/15 1314  Weight: 75.751 kg (167 lb)    History of present illness:  39 year old smoker admitted for confusion, nausea vomiting  Hospital Course:  Admitted to telemetry.  UDS positive for THC, opiates and amphetamine.  MRI showed bilateral frontal lobe strokes.  CVA  - MRI with acute non hemorrhagic infarcts, neurology consulted. Started on aspirin.  Confusion speech difficulty cleared. For TEE tomorrow. Per neurology, ok for discharge, outpatient TEE and neurology follow up in 2 months.  hypercoaguable panel pending. - CT angio neck and head without significant stenosis  - 2D echo with normal EF 65-70%, no WMA  - LDL 98 started on statin Encourage to quit smoking and using drugs  Nausea/vomiting  - resolved with supportive measures. Tolerating solids  Polysubstance abuse  - counseled for cessation   GERD  - continue PPI   Procedures:  none  Consultations:  neurology  Discharge Exam: Filed Vitals:   05/25/15 0555  BP: 156/99  Pulse: 111  Temp: 98.2 F (36.8 C)  Resp: 18    General: a and o Cardiovascular: RRR Respiratory: CTA Neuro: CN intact motor 5/5. Speech clear and fluent Psych: cooperative, normal affecr  Discharge Instructions   Discharge Instructions    Diet general    Complete by:  As directed      Increase activity slowly    Complete by:  As directed            Current Discharge Medication List    START taking these medications   Details  aspirin EC 81 MG EC tablet Take 1 tablet (81 mg total) by mouth daily.    atorvastatin (LIPITOR) 20 MG tablet Take 1 tablet (20 mg total) by mouth daily at 6 PM. Qty: 30 tablet, Refills: 1      CONTINUE these medications which have NOT CHANGED   Details  ibuprofen (ADVIL,MOTRIN) 400 MG tablet Take 400 mg by mouth every 6 (six) hours as needed for mild pain.    medroxyPROGESTERone (DEPO-PROVERA) 150 MG/ML injection Inject 150 mg into the muscle every 3 (three) months.       No Known Allergies Follow-up Information    Follow up with [facility].   Why:  Report to "Admitting" at [facility] on 05/26/2015 at 9:45AM for your Transesophageal Echocardiogram. Nothing to eat or drink after midnight.   Contact information:   [address] [id number] mc [place] [address] [phone]       The results of significant diagnostics from this hospitalization (including imaging, microbiology, ancillary and laboratory) are listed below for reference.    Significant Diagnostic Studies: Ct Angio Head W/cm &/or Wo Cm  05/23/2015   CLINICAL DATA:  39 year old hypertensive female with confusion. Subsequent encounter.  EXAM: CT ANGIOGRAPHY HEAD AND NECK  TECHNIQUE: Multidetector CT imaging of the head and neck was performed using the standard protocol during bolus administration of intravenous contrast. Multiplanar CT  image reconstructions and MIPs were obtained to evaluate the vascular anatomy. Carotid stenosis measurements (when applicable) are obtained utilizing NASCET criteria, using the distal internal carotid diameter as the denominator.  CONTRAST:  50mL OMNIPAQUE IOHEXOL 350 MG/ML SOLN  COMPARISON:  05/23/2015 brain MR.  FINDINGS: CT HEAD  Brain: Bilateral frontal lobe infarcts better visualized on recent MR. No intracranial hemorrhage. No intracranial  enhancing lesion. No hydrocephalus.  Calvarium and skull base: Negative.  Paranasal sinuses: Clear.  Orbits: Negative.  CTA NECK  Aortic arch: 3 vessel aortic arch.  Right carotid system: No significant stenosis. Ectatic right internal carotid artery.  Left carotid system: No significant stenosis. Ectatic left internal carotid artery.  Vertebral arteries:No high-grade stenosis of the vertebral arteries. Irregular appearance of portions of the vertebral arteries may be related to motion artifact rather than fibromuscular dysplasia.  Skeleton: No osseous abnormality.  Other neck: Mild cervical spondylotic changes most notable C5-6 and C6-7. Prominent adenoidal tissue. Lung apices without worrisome abnormality.  CTA HEAD  Anterior circulation: Mild narrowing cavernous segment internal carotid artery bilaterally. Mild to moderate narrowing and irregularity of the middle cerebral artery and anterior cerebral artery bilaterally.  Posterior circulation: Narrowing distal vertebral arteries. Narrowed irregular basilar artery. Narrowing posterior cerebral arteries bilaterally.  Venous sinuses: Negative.  Anatomic variants: Negative.  Delayed phase: Negative.  IMPRESSION: CTA HEAD  Mild narrowing cavernous segment internal carotid artery bilaterally.  Mild to moderate narrowing and irregularity of the middle cerebral artery and anterior cerebral artery bilaterally.  Narrowing distal vertebral arteries. Narrowed irregular basilar artery. Narrowing posterior cerebral arteries bilaterally.  Findings may represent result of hypertension/atherosclerosis although underlying vasculitis not excluded. Spasm is a secondary less likely consideration.  CTA NECK  No evidence the hemodynamically significant stenosis of either carotid bifurcation.  CT HEAD  Bilateral frontal lobe infarcts better visualized on recent MR.   Electronically Signed   By: Lacy Duverney M.D.   On: 05/23/2015 16:56   Ct Angio Neck W/cm &/or Wo/cm  05/23/2015    CLINICAL DATA:  39 year old hypertensive female with confusion. Subsequent encounter.  EXAM: CT ANGIOGRAPHY HEAD AND NECK  TECHNIQUE: Multidetector CT imaging of the head and neck was performed using the standard protocol during bolus administration of intravenous contrast. Multiplanar CT image reconstructions and MIPs were obtained to evaluate the vascular anatomy. Carotid stenosis measurements (when applicable) are obtained utilizing NASCET criteria, using the distal internal carotid diameter as the denominator.  CONTRAST:  50mL OMNIPAQUE IOHEXOL 350 MG/ML SOLN  COMPARISON:  05/23/2015 brain MR.  FINDINGS: CT HEAD  Brain: Bilateral frontal lobe infarcts better visualized on recent MR. No intracranial hemorrhage. No intracranial enhancing lesion. No hydrocephalus.  Calvarium and skull base: Negative.  Paranasal sinuses: Clear.  Orbits: Negative.  CTA NECK  Aortic arch: 3 vessel aortic arch.  Right carotid system: No significant stenosis. Ectatic right internal carotid artery.  Left carotid system: No significant stenosis. Ectatic left internal carotid artery.  Vertebral arteries:No high-grade stenosis of the vertebral arteries. Irregular appearance of portions of the vertebral arteries may be related to motion artifact rather than fibromuscular dysplasia.  Skeleton: No osseous abnormality.  Other neck: Mild cervical spondylotic changes most notable C5-6 and C6-7. Prominent adenoidal tissue. Lung apices without worrisome abnormality.  CTA HEAD  Anterior circulation: Mild narrowing cavernous segment internal carotid artery bilaterally. Mild to moderate narrowing and irregularity of the middle cerebral artery and anterior cerebral artery bilaterally.  Posterior circulation: Narrowing distal vertebral arteries. Narrowed irregular basilar artery. Narrowing posterior cerebral arteries  bilaterally.  Venous sinuses: Negative.  Anatomic variants: Negative.  Delayed phase: Negative.  IMPRESSION: CTA HEAD  Mild narrowing  cavernous segment internal carotid artery bilaterally.  Mild to moderate narrowing and irregularity of the middle cerebral artery and anterior cerebral artery bilaterally.  Narrowing distal vertebral arteries. Narrowed irregular basilar artery. Narrowing posterior cerebral arteries bilaterally.  Findings may represent result of hypertension/atherosclerosis although underlying vasculitis not excluded. Spasm is a secondary less likely consideration.  CTA NECK  No evidence the hemodynamically significant stenosis of either carotid bifurcation.  CT HEAD  Bilateral frontal lobe infarcts better visualized on recent MR.   Electronically Signed   By: Lacy Duverney M.D.   On: 05/23/2015 16:56   Mr Brain Wo Contrast  05/23/2015   CLINICAL DATA:  39 year old hypertensive female off medication for 8 months presenting with recurrent vomiting, confusion mental status changes over the past week. Initial encounter.  EXAM: MRI HEAD WITHOUT CONTRAST  TECHNIQUE: Multiplanar, multiecho pulse sequences of the brain and surrounding structures were obtained without intravenous contrast.  COMPARISON:  None.  FINDINGS: Acute/subacute nonhemorrhagic infarcts posterior superior right frontal lobe and mid superior left frontal lobe. Questionable tiny left peri operculum infarct. Infarcts involve two vascular distributions and therefore cannot exclude embolic disease or result of underlying vasculitis. Appearance not typical for reversible encephalopathy syndrome. Result of encephalitis felt to be less likely consideration. No adjacent major dural sinus thrombosis.  No intracranial hemorrhage.  No intracranial mass lesion noted on this unenhanced exam.  No hydrocephalus.  Major intracranial vascular structures appear patent.  Slightly decreased signal intensity of bone marrow may be related to combination of patient's habitus and anemia.  Mild prominence adenoidal tissue.  Partially empty non expanded sella without secondary findings of  pseudotumor cerebri  Cervical medullary junction and pineal region unremarkable.  IMPRESSION: Acute/subacute nonhemorrhagic infarcts posterior superior right frontal lobe and mid superior left frontal lobe. Questionable tiny left peri operculum infarct. Infarcts involve two vascular distributions and therefore cannot exclude embolic disease or result of underlying vasculitis. Appearance not typical for reversible encephalopathy syndrome. Result of encephalitis felt to be less likely consideration. No adjacent major dural sinus thrombosis.  Slightly decreased signal intensity of bone marrow may be related to combination of patient's habitus and anemia.  Partially empty non expanded sella without secondary findings of pseudotumor cerebri.   Electronically Signed   By: Lacy Duverney M.D.   On: 05/23/2015 08:43    Microbiology: No results found for this or any previous visit (from the past 240 hour(s)).   Labs: Basic Metabolic Panel:  Recent Labs Lab 05/22/15 2240 05/23/15 0128 05/23/15 0230 05/23/15 0348 05/25/15 0620  NA 132*  --   --  133* 132*  K 2.6*  --   --  3.3* 3.7  CL 94*  --   --  96* 99*  CO2 28  --   --  30 24  GLUCOSE 115*  --   --  97 113*  BUN <5*  --   --  <5* <5*  CREATININE 0.64  --  0.45 0.60 0.54  CALCIUM 9.0  --   --  8.9 9.2  MG  --  1.7  --  1.6* 1.9  PHOS  --   --   --   --  3.6   Liver Function Tests:  Recent Labs Lab 05/22/15 2240 05/23/15 0348 05/25/15 0620  AST 17 12* 14*  ALT ALKPHOS 68 63 63  BILITOT 0.3 0.2*  0.3  PROT 5.8* 5.6* 6.4*  ALBUMIN 3.1* 3.0* 3.2*    Recent Labs Lab 05/22/15 2240  LIPASE 35   No results for input(s): AMMONIA in the last 168 hours. CBC:  Recent Labs Lab 05/22/15 2240 05/23/15 0230 05/25/15 0620  WBC 8.6 8.5 7.6  HGB 13.1 12.3 14.5  HCT 38.3 35.7* 42.9  MCV 88.0 87.9 89.6  PLT 218 186 250   Cardiac Enzymes:  Recent Labs Lab 05/23/15 0230 05/23/15 0840  TROPONINI <0.03 <0.03   BNP: BNP  (last 3 results)  Recent Labs  05/23/15 0230  BNP 14.2    ProBNP (last 3 results) No results for input(s): PROBNP in the last 8760 hours.  CBG: No results for input(s): GLUCAP in the last 168 hours.     SignedChristiane Ha  [facility] 05/25/2015, 11:54 AM

## 2015-05-25 NOTE — Progress Notes (Signed)
SLP Cancellation Note  Patient Details Name: Danielle [place] MRN: [id number] DOB: [birth year]   Cancelled treatment:       Reason Eval/Treat Not Completed: Patient declined cognitive evaluation stating that she was fine.  ST to sign off at this time.  Please reconsult if needed.  Thank you.      Dimas Aguas, MA, CCC-SLP Acute Rehab SLP [phone]   Fleet Contras 05/25/2015, 11:42 AM

## 2015-05-25 NOTE — Progress Notes (Signed)
STROKE TEAM PROGRESS NOTE   SUBJECTIVE (INTERVAL HISTORY) Patient's RN and large family at bedside. Plans for discharge today and return tomorrow for 945.   OBJECTIVE Temp:  [98.2 F (36.8 C)-98.7 F (37.1 C)] 98.2 F (36.8 C) (10/03 0555) Pulse Rate:  [93-111] 111 (10/03 0555) Cardiac Rhythm:  [-] Sinus tachycardia (10/03 0828) Resp:  [18] 18 (10/03 0555) BP: (128-158)/(88-99) 156/99 mmHg (10/03 0555) SpO2:  [95 %-99 %] 99 % (10/03 0555)  CBC:   Recent Labs Lab 05/23/15 0230 05/25/15 0620  WBC 8.5 7.6  HGB 12.3 14.5  HCT 35.7* 42.9  MCV 87.9 89.6  PLT 186 250    Basic Metabolic Panel:   Recent Labs Lab 05/23/15 0348 05/25/15 0620  NA 133* 132*  K 3.3* 3.7  CL 96* 99*  CO2 30 24  GLUCOSE 97 113*  BUN <5* <5*  CREATININE 0.60 0.54  CALCIUM 8.9 9.2  MG 1.6* 1.9  PHOS  --  3.6    Lipid Panel:     Component Value Date/Time   CHOL 151 05/24/2015 0550   TRIG 61 05/24/2015 0550   HDL 41 05/24/2015 0550   CHOLHDL 3.7 05/24/2015 0550   VLDL 12 05/24/2015 0550   LDLCALC 98 05/24/2015 0550   HgbA1c:  Lab Results  Component Value Date   HGBA1C 5.8* 05/24/2015   Urine Drug Screen:     Component Value Date/Time   LABOPIA POSITIVE* 05/23/2015 0120   COCAINSCRNUR NONE DETECTED 05/23/2015 0120   LABBENZ NONE DETECTED 05/23/2015 0120   AMPHETMU POSITIVE* 05/23/2015 0120   THCU POSITIVE* 05/23/2015 0120   LABBARB NONE DETECTED 05/23/2015 0120      IMAGING  Ct Angio Head W/cm &/or Wo Cm 05/23/2015     CTA HEAD   Mild narrowing cavernous segment internal carotid artery bilaterally.  Mild to moderate narrowing and irregularity of the middle cerebral artery and anterior cerebral artery bilaterally.  Narrowing distal vertebral arteries. Narrowed irregular basilar artery. Narrowing posterior cerebral arteries bilaterally.  Findings may represent result of hypertension/atherosclerosis although underlying vasculitis not excluded. Spasm is a secondary less likely  consideration.    CTA NECK   No evidence the hemodynamically significant stenosis of either carotid bifurcation.    CT HEAD  Bilateral frontal lobe infarcts better visualized on recent MR.     Mr Brain Wo Contrast 05/23/2015    Acute/subacute nonhemorrhagic infarcts posterior superior right frontal lobe and mid superior left frontal lobe. Questionable tiny left peri operculum infarct. Infarcts involve two vascular distributions and therefore cannot exclude embolic disease or result of underlying vasculitis. Appearance not typical for reversible encephalopathy syndrome. Result of encephalitis felt to be less likely consideration. No adjacent major dural sinus thrombosis.  Slightly decreased signal intensity of bone marrow may be related to combination of patient's habitus and anemia.  Partially empty non expanded sella without secondary findings of pseudotumor cerebri.     2D Echocardiogram  - Left ventricle: The cavity size was normal. Systolic function was vigorous. The estimated ejection fraction was in the range of 65%to 70%. Wall motion was normal; there were no regional wallmotion abnormalities.   PHYSICAL EXAM Pleasant young obese Caucasian lady not in distress. . Afebrile. Head is nontraumatic. Neck is supple without bruit.    Cardiac exam no murmur or gallop. Lungs are clear to auscultation. Distal pulses are well felt.  Neurological Exam ;  Awake  Alert oriented x 3. Normal speech and language.eye movements .very occasional word finding difficulty full without nystagmus.fundi   were not visualized. Vision acuity and fields appear normal. Hearing is normal. Palatal movements are normal. Face symmetric. Tongue midline. Normal strength, tone, reflexes and coordination. Normal sensation. Gait deferred.    ASSESSMENT/PLAN Danielle [place] is a 39 y.o. female with history of tobacco use, substance abuse, hypertension, and asthma presenting with vomiting, mild confusion, mild agitation, and  word finding difficulties.. She did not receive IV t-PA due to late presentation.   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  Resultant resolution of deficits  MRI - Acute/subacute nonhemorrhagic infarcts bilateral frontal lobes.  MRA  not performed  CTA of head and neck - no high-grade stenosis. Question vasculitis or spasm.  Carotid Doppler  refer to CTA of the neck  2D Echo EF 65-70%. No cardiac source of emboli identified.  TEE to look for embolic source. Arranged for tomorrow morning at 0945a. Pt to be discharged and return in am for test. If positive for PFO (patent foramen ovale), check bilateral lower extremity venous dopplers to rule out DVT as possible source of stroke.   LDL 98  HgbA1c 5.8  Vasculitis labs - ESR 26  VTE prophylaxis - Lovenox Diet Heart Room service appropriate?: Yes; Fluid consistency:: Thin  no antithrombotic prior to admission, now on aspirin 81 mg orally every day  Patient counseled to be compliant with her antithrombotic medications  Ongoing aggressive stroke risk factor management  Therapy recommendations:  No therapy  Disposition: home  Follow up Dr. Devario Bucklew in 2 months, stroke clinic  Hypertension  Stable  Permissive hypertension (OK if < 220/120) but gradually normalize in 5-7 days  Hyperlipidemia  Home meds:  No lipid lowering medications prior to admission.  LDL 98, goal < 70  Added Lipitor 20 mg daily  Continue statin at discharge  Other Stroke Risk Factors  Cigarette smoker, advised to stop smoking  Obesity, Body mass index is 31.05 kg/(m^2).   Family hx stroke (mother and grandmother)  Substance abuse history  Depo-Provera use  Other Active Problems  UDS positive for Amphetamines (Adderall), THC, and opiates  Mild hypokalemia, mild hyponatremia, mild hypomagnesemia.  Hospital day # 2  BIBY,SHARON  [facility] See Amion for Pager information 05/25/2015 4:03 PM  I  have personally examined this patient, reviewed notes, independently viewed imaging studies, participated in medical decision making and plan of care. I have made any additions or clarifications directly to the above note. Agree with note above. She wants to go and return for outpatient TEE. She can follow up with me in the office in the stroke clinic in 2 months.  Librada Castronovo, MD Medical Director [facility] Pager: [phone] 05/25/2015 6:15 PM  To contact Stroke Continuity provider, please refer to [email or web address]. After hours, contact General Neurology 

## 2015-05-26 ENCOUNTER — Ambulatory Visit (HOSPITAL_COMMUNITY): Admission: RE | Admit: 2015-05-26 | Payer: Medicaid Other | Source: Ambulatory Visit | Admitting: Cardiology

## 2015-05-26 ENCOUNTER — Encounter (HOSPITAL_COMMUNITY)
Admission: RE | Disposition: A | Discharge status: Home / Self Care | Payer: Self-pay | Source: Ambulatory Visit | Attending: Cardiology

## 2015-05-26 ENCOUNTER — Encounter (HOSPITAL_COMMUNITY): Payer: Self-pay

## 2015-05-26 ENCOUNTER — Ambulatory Visit (HOSPITAL_COMMUNITY)
Admission: RE | Admit: 2015-05-26 | Discharge: 2015-05-26 | Disposition: A | Discharge status: Home / Self Care | Payer: Medicaid Other | Source: Ambulatory Visit | Attending: Cardiology | Admitting: Cardiology

## 2015-05-26 DIAGNOSIS — I639 Cerebral infarction, unspecified: Secondary | ICD-10-CM | POA: Diagnosis present

## 2015-05-26 DIAGNOSIS — R1110 Vomiting, unspecified: Secondary | ICD-10-CM | POA: Diagnosis not present

## 2015-05-26 DIAGNOSIS — Z5309 Procedure and treatment not carried out because of other contraindication: Secondary | ICD-10-CM | POA: Insufficient documentation

## 2015-05-26 DIAGNOSIS — I10 Essential (primary) hypertension: Secondary | ICD-10-CM | POA: Insufficient documentation

## 2015-05-26 HISTORY — PX: TEE WITHOUT CARDIOVERSION: SHX5443

## 2015-05-26 LAB — HIV ANTIBODY (ROUTINE TESTING W REFLEX): HIV SCREEN 4TH GENERATION: NONREACTIVE

## 2015-05-26 LAB — COMPLEMENT, TOTAL: Compl, Total (CH50): >60 U/mL — ABNORMAL HIGH (ref 42–60)

## 2015-05-26 SURGERY — ECHOCARDIOGRAM, TRANSESOPHAGEAL
Anesthesia: Moderate Sedation

## 2015-05-26 MED ORDER — MIDAZOLAM HCL 5 MG/ML IJ SOLN
INTRAMUSCULAR | Status: AC
  Filled 2015-05-26: qty 2

## 2015-05-26 MED ORDER — DIPHENHYDRAMINE HCL 50 MG/ML IJ SOLN
INTRAMUSCULAR | Status: AC
Start: 2015-05-26 — End: 2015-05-26
  Filled 2015-05-26: qty 1

## 2015-05-26 MED ORDER — FENTANYL CITRATE (PF) 100 MCG/2ML IJ SOLN
INTRAMUSCULAR | Status: DC | PRN
  Administered 2015-05-26: 25 ug via INTRAVENOUS
  Administered 2015-05-26 (×2): 12.5 ug via INTRAVENOUS

## 2015-05-26 MED ORDER — SODIUM CHLORIDE 0.9 % IV SOLN
INTRAVENOUS | Status: DC
Start: 2015-05-26 — End: 2015-05-26
  Administered 2015-05-26: 500 mL via INTRAVENOUS

## 2015-05-26 MED ORDER — MIDAZOLAM HCL 10 MG/2ML IJ SOLN
INTRAMUSCULAR | Status: DC | PRN
  Administered 2015-05-26: 2 mg via INTRAVENOUS
  Administered 2015-05-26 (×2): 1 mg via INTRAVENOUS

## 2015-05-26 MED ORDER — METOPROLOL TARTRATE 1 MG/ML IV SOLN
INTRAVENOUS | Status: DC | PRN
  Administered 2015-05-26 (×2): 2.5 mg via INTRAVENOUS

## 2015-05-26 MED ORDER — METOPROLOL TARTRATE 1 MG/ML IV SOLN
INTRAVENOUS | Status: AC
  Filled 2015-05-26: qty 5

## 2015-05-26 MED ORDER — FENTANYL CITRATE (PF) 100 MCG/2ML IJ SOLN
INTRAMUSCULAR | Status: AC
  Filled 2015-05-26: qty 2

## 2015-05-26 MED ORDER — BUTAMBEN-TETRACAINE-BENZOCAINE 2-2-14 % EX AERO
INHALATION_SPRAY | CUTANEOUS | Status: DC | PRN
  Administered 2015-05-26: 2 via TOPICAL

## 2015-05-26 NOTE — CV Procedure (Signed)
    PROCEDURE NOTE:  Procedure:  Transesophageal echocardiogram Operator:  Armanda Magic, MD Indications:  CVA Complications: None IV Meds:Lopressor  IV, Fentanyl and Versed  IV  Patient very hypertensive on arrival in [facility] at 197/173mmHg.  She was given sedation with Versed and Fentanyl and Lopressor  IV.  SBP dropped down to .  Patient sedated and snoring.  Probe placed without difficult.  Patient became agitated and was given further sedation but then vomited bilious appearing emesis.  Procedure terminated.  Patient stable at end of procedure with persistently elevated BP at 152/11mmHg.  Will monitor BP and HR post procedure.  May need to be on antihypertensive medication.  Will have her followup with Neuro to get BP under control and once controlled and consider repeat TEE.   Signed: Armanda Magic, MD [facility]

## 2015-05-26 NOTE — Discharge Instructions (Signed)
Conscious Sedation, Adult, Care After °Refer to this sheet in the next few weeks. These instructions provide you with information on caring for yourself after your procedure. Your health care provider may also give you more specific instructions. Your treatment has been planned according to current medical practices, but problems sometimes occur. Call your health care provider if you have any problems or questions after your procedure. °WHAT TO EXPECT AFTER THE PROCEDURE  °After your procedure: °· You may feel sleepy, clumsy, and have poor balance for several hours. °· Vomiting may occur if you eat too soon after the procedure. °HOME CARE INSTRUCTIONS °· Do not participate in any activities where you could become injured for at least 24 hours. Do not: °¨ Drive. °¨ Swim. °¨ Ride a bicycle. °¨ Operate heavy machinery. °¨ Cook. °¨ Use power tools. °¨ Climb ladders. °¨ Work from a high place. °· Do not make important decisions or sign legal documents until you are improved. °· If you vomit, drink water, juice, or soup when you can drink without vomiting. Make sure you have little or no nausea before eating solid foods. °· Only take over-the-counter or prescription medicines for pain, discomfort, or fever as directed by your health care provider. °· Make sure you and your family fully understand everything about the medicines given to you, including what side effects may occur. °· You should not drink alcohol, take sleeping pills, or take medicines that cause drowsiness for at least 24 hours. °· If you smoke, do not smoke without supervision. °· If you are feeling better, you may resume normal activities 24 hours after you were sedated. °· Keep all appointments with your health care provider. °SEEK MEDICAL CARE IF: °· Your skin is pale or bluish in color. °· You continue to feel nauseous or vomit. °· Your pain is getting worse and is not helped by medicine. °· You have bleeding or swelling. °· You are still sleepy or  feeling clumsy after 24 hours. °SEEK IMMEDIATE MEDICAL CARE IF: °· You develop a rash. °· You have difficulty breathing. °· You develop any type of allergic problem. °· You have a fever. °MAKE SURE YOU: °· Understand these instructions. °· Will watch your condition. °· Will get help right away if you are not doing well or get worse. °Document Released: 05/29/2013 Document Reviewed: 05/29/2013 °ExitCare® Patient Information ©2015 [employer]. This information is not intended to replace advice given to you by your health care provider. Make sure you discuss any questions you have with your health care provider. °  °

## 2015-05-26 NOTE — H&P (View-Only) (Signed)
STROKE TEAM PROGRESS NOTE   SUBJECTIVE (INTERVAL HISTORY) Patient's RN and large family at bedside. Plans for discharge today and return tomorrow for 945.   OBJECTIVE Temp:  [98.2 F (36.8 C)-98.7 F (37.1 C)] 98.2 F (36.8 C) (10/03 0555) Pulse Rate:  [93-111] 111 (10/03 0555) Cardiac Rhythm:  [-] Sinus tachycardia (10/03 0828) Resp:  [18] 18 (10/03 0555) BP: (128-158)/(88-99) 156/99 mmHg (10/03 0555) SpO2:  [95 %-99 %] 99 % (10/03 0555)  CBC:   Recent Labs Lab 05/23/15 0230 05/25/15 0620  WBC 8.5 7.6  HGB 12.3 14.5  HCT 35.7* 42.9  MCV 87.9 89.6  PLT 186 656    Basic Metabolic Panel:   Recent Labs Lab 05/23/15 0348 05/25/15 0620  NA 133* 132*  K 3.3* 3.7  CL 96* 99*  CO2 30 24  GLUCOSE 97 113*  BUN <5* <5*  CREATININE 0.60 0.54  CALCIUM 8.9 9.2  MG 1.6* 1.9  PHOS  --  3.6    Lipid Panel:     Component Value Date/Time   CHOL 151 05/24/2015 0550   TRIG 61 05/24/2015 0550   HDL 41 05/24/2015 0550   CHOLHDL 3.7 05/24/2015 0550   VLDL 12 05/24/2015 0550   LDLCALC 98 05/24/2015 0550   HgbA1c:  Lab Results  Component Value Date   HGBA1C 5.8* 05/24/2015   Urine Drug Screen:     Component Value Date/Time   LABOPIA POSITIVE* 05/23/2015 0120   COCAINSCRNUR NONE DETECTED 05/23/2015 0120   LABBENZ NONE DETECTED 05/23/2015 0120   AMPHETMU POSITIVE* 05/23/2015 0120   THCU POSITIVE* 05/23/2015 0120   LABBARB NONE DETECTED 05/23/2015 0120      IMAGING  Ct Angio Head W/cm &/or Wo Cm 05/23/2015     CTA HEAD   Mild narrowing cavernous segment internal carotid artery bilaterally.  Mild to moderate narrowing and irregularity of the middle cerebral artery and anterior cerebral artery bilaterally.  Narrowing distal vertebral arteries. Narrowed irregular basilar artery. Narrowing posterior cerebral arteries bilaterally.  Findings may represent result of hypertension/atherosclerosis although underlying vasculitis not excluded. Spasm is a secondary less likely  consideration.    CTA NECK   No evidence the hemodynamically significant stenosis of either carotid bifurcation.    CT HEAD  Bilateral frontal lobe infarcts better visualized on recent MR.     Mr Brain Wo Contrast 05/23/2015    Acute/subacute nonhemorrhagic infarcts posterior superior right frontal lobe and mid superior left frontal lobe. Questionable tiny left peri operculum infarct. Infarcts involve two vascular distributions and therefore cannot exclude embolic disease or result of underlying vasculitis. Appearance not typical for reversible encephalopathy syndrome. Result of encephalitis felt to be less likely consideration. No adjacent major dural sinus thrombosis.  Slightly decreased signal intensity of bone marrow may be related to combination of patient's habitus and anemia.  Partially empty non expanded sella without secondary findings of pseudotumor cerebri.     2D Echocardiogram  - Left ventricle: The cavity size was normal. Systolic function was vigorous. The estimated ejection fraction was in the range of 65%to 70%. Wall motion was normal; there were no regional wallmotion abnormalities.   PHYSICAL EXAM Pleasant young obese Caucasian lady not in distress. . Afebrile. Head is nontraumatic. Neck is supple without bruit.    Cardiac exam no murmur or gallop. Lungs are clear to auscultation. Distal pulses are well felt.  Neurological Exam ;  Awake  Alert oriented x 3. Normal speech and language.eye movements .very occasional word finding difficulty full without nystagmus.fundi  were not visualized. Vision acuity and fields appear normal. Hearing is normal. Palatal movements are normal. Face symmetric. Tongue midline. Normal strength, tone, reflexes and coordination. Normal sensation. Gait deferred.    ASSESSMENT/PLAN [name] is a 39 y.o. female with history of tobacco use, substance abuse, hypertension, and asthma presenting with vomiting, mild confusion, mild agitation, and  word finding difficulties.. She did not receive IV t-PA due to late presentation.   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  Resultant resolution of deficits  MRI - Acute/subacute nonhemorrhagic infarcts bilateral frontal lobes.  MRA  not performed  CTA of head and neck - no high-grade stenosis. Question vasculitis or spasm.  Carotid Doppler  refer to CTA of the neck  2D Echo EF 65-70%. No cardiac source of emboli identified.  TEE to look for embolic source. Arranged for tomorrow morning at 0945a. Pt to be discharged and return in am for test. If positive for PFO (patent foramen ovale), check bilateral lower extremity venous dopplers to rule out DVT as possible source of stroke.   LDL 98  HgbA1c 5.8  Vasculitis labs - ESR 26  VTE prophylaxis - Lovenox Diet Heart Room service appropriate?: Yes; Fluid consistency:: Thin  no antithrombotic prior to admission, now on aspirin 81 mg orally every day  Patient counseled to be compliant with her antithrombotic medications  Ongoing aggressive stroke risk factor management  Therapy recommendations:  No therapy  Disposition: home  Follow up Dr. Leonie Man in 2 months, stroke clinic  Hypertension  Stable  Permissive hypertension (OK if < 220/120) but gradually normalize in 5-7 days  Hyperlipidemia  Home meds:  No lipid lowering medications prior to admission.  LDL 98, goal < 70  Added Lipitor 20 mg daily  Continue statin at discharge  Other Stroke Risk Factors  Cigarette smoker, advised to stop smoking  Obesity, Body mass index is 31.05 kg/(m^2).   Family hx stroke (mother and grandmother)  Substance abuse history  Depo-Provera use  Other Active Problems  UDS positive for Amphetamines (Adderall), THC, and opiates  Mild hypokalemia, mild hyponatremia, mild hypomagnesemia.  Hospital day # [place] for Pager information 05/25/2015 4:03 PM  I  have personally examined this patient, reviewed notes, independently viewed imaging studies, participated in medical decision making and plan of care. I have made any additions or clarifications directly to the above note. Agree with note above. She wants to go and return for outpatient TEE. She can follow up with me in the office in the stroke clinic in 2 months.  Antony Contras, MD Medical Director [facility] Pager: [phone] 05/25/2015 6:15 PM  To contact Stroke Continuity provider, please refer to [email or web address] After hours, contact General Neurology

## 2015-05-26 NOTE — Progress Notes (Signed)
Dr Mayford Knife cancelled procedure after patient had moderate amount of light green emesis during esophageal intubation with scope; states that they will need to gain better control over pts BP and then she can be rescheduled for this procedure, but she was not comfortable moving forward with proceeding after patient vomitted

## 2015-05-26 NOTE — Interval H&P Note (Signed)
History and Physical Interval Note:  05/26/2015 11:24 AM  Danielle Washington  has presented today for surgery, with the diagnosis of stroke  The various methods of treatment have been discussed with the patient and family. After consideration of risks, benefits and other options for treatment, the patient has consented to  Procedure(s): TRANSESOPHAGEAL ECHOCARDIOGRAM (TEE) (N/A) as a surgical intervention .  The patient's history has been reviewed, patient examined, no change in status, stable for surgery.  I have reviewed the patient's chart and labs.  Questions were answered to the patient's satisfaction.     TURNER,TRACI R

## 2015-05-26 NOTE — Interval H&P Note (Signed)
History and Physical Interval Note:  05/26/2015 9:42 AM  Danielle Washington  has presented today for surgery, with the diagnosis of stroke  The various methods of treatment have been discussed with the patient and family. After consideration of risks, benefits and other options for treatment, the patient has consented to  Procedure(s): TRANSESOPHAGEAL ECHOCARDIOGRAM (TEE) (N/A) as a surgical intervention .  The patient's history has been reviewed, patient examined, no change in status, stable for surgery.  I have reviewed the patient's chart and labs.  Questions were answered to the patient's satisfaction.     TURNER,TRACI R

## 2015-05-27 ENCOUNTER — Encounter (HOSPITAL_COMMUNITY): Payer: Self-pay | Admitting: Cardiology

## 2015-06-21 DIAGNOSIS — R451 Restlessness and agitation: Secondary | ICD-10-CM | POA: Insufficient documentation

## 2015-06-21 DIAGNOSIS — R1110 Vomiting, unspecified: Secondary | ICD-10-CM | POA: Insufficient documentation

## 2015-07-27 ENCOUNTER — Encounter: Payer: Self-pay | Admitting: Neurology

## 2015-07-27 ENCOUNTER — Ambulatory Visit (INDEPENDENT_AMBULATORY_CARE_PROVIDER_SITE_OTHER): Payer: Medicaid Other | Admitting: Neurology

## 2015-07-27 VITALS — BP 113/71 | HR 71 | Ht 62.0 in | Wt 158.6 lb

## 2015-07-27 DIAGNOSIS — I639 Cerebral infarction, unspecified: Secondary | ICD-10-CM | POA: Diagnosis not present

## 2015-07-27 MED ORDER — ATORVASTATIN CALCIUM 20 MG PO TABS: 20.0000 mg | ORAL_TABLET | Freq: Every day | ORAL | Status: DC

## 2015-07-27 NOTE — Progress Notes (Signed)
[facility] [address] [place]. [place] [address] [phone]       OFFICE FOLLOW-UP NOTE  [name] Date of Birth:  [birth year] Medical Record Number:  [id number]   HPI: 62 year Caucasian lady seen today for first office follow-up visit following hospital admission for stroke in October 2016.Danielle Washington is a 39 y.o. female with a history of substance abuse (cocaine in the past), tobacco use, hypertension, and asthma admitted to [facility] 05/23/15 with a two-week history of daily vomiting, generalized weakness, mild confusion and some agitation. She has also noted some intermittent word finding difficulties. An MRI 05/23/15 personally reviewed by me revealed acute/subacute nonhemorrhagic infarcts posterior superior right frontal lobe and mid superior left frontal lobe possibly embolic. She was not on antiplatelet therapy prior to admission. Her urine drug screen was positive for opiates, and amphetamines, and THC. The patient reported that she has been prescribed Adderall by Dr. Harrington Challenger a psychiatrist in [place] for difficulty concentrating. A urine pregnancy test  was negative. She has been on Depo-Provera - a thromboembolic risk. She denies headache, palpitations, or visual problems. She reports both her mother and grandmother had strokes. Her grandmother had multiple strokes secondary to atrial fibrillation. Date last known well: Unable to determine Time last known well: Unable to determine tPA Given: No: Late presentation CT angiogram of the head showed no large vessel stenosis and CT angiogram of the neck was also unremarkable. MRI scan which I personally reviewed confirmed acute to subacute infarcts involving superior right and left frontal lobes as well as a questionable tiny and left peri-operculum infarct. Transthoracic echo showed normal ejection fraction without cardiac source of embolism. Transesophageal echocardiogram showed no cardiac source of embolism  or PFO. LDL cholesterol was elevated at 98 mg percent and hemoglobin A1c was 5.8. Complement levels, ANA panel, ESR, RPR, HIV antibody were all normal.  Patient was counseled to quit smoking but states that she is only cutback at present to half pack per day and is trying to quit. She was advised not to take Depo-Provera shots. She is on aspirin 81 mg which is tolerating well as well as Lipitor and she needs a refill for that. She has started eating healthy and exercising. She is trying to find herself a primary care physician. She has no complaints today and states she's return back to her baseline. . ROS:   14 system review of systems is positive for fatigue, trouble swallowing, increased thirst, joint pain, numbness, restless legs and all other systems negative  PMH:  Past Medical History  Diagnosis Date  . Hypertension   . Asthma   . Stroke [facility])     Social History:  Social History   Social History  . Marital Status: Legally Separated    Spouse Name: N/A  . Number of Children: N/A  . Years of Education: N/A   Occupational History  . Not on file.   Social History Main Topics  . Smoking status: Current Every Day Smoker -- 1.00 packs/day for 20 years  . Smokeless tobacco: Not on file  . Alcohol Use: 1.2 oz/week    1 Glasses of wine, 1 Shots of liquor per week     Comment: occasionally  . Drug Use: No  . Sexual Activity: Not on file   Other Topics Concern  . Not on file   Social History Narrative    Medications:   Current Outpatient Prescriptions on File Prior to Visit  Medication Sig Dispense Refill  .  aspirin EC 81 MG EC tablet Take 1 tablet (81 mg total) by mouth daily.    Marland Kitchen ibuprofen (ADVIL,MOTRIN) 400 MG tablet Take 400 mg by mouth every 6 (six) hours as needed for mild pain.    . medroxyPROGESTERone (DEPO-PROVERA) 150 MG/ML injection Inject 150 mg into the muscle every 3 (three) months.     No current facility-administered medications on file prior to visit.     Allergies:  No Known Allergies  Physical Exam General: well developed, well nourished young Caucasian lady, seated, in no evident distress Head: head normocephalic and atraumatic.  Neck: supple with no carotid or supraclavicular bruits Cardiovascular: regular rate and rhythm, no murmurs Musculoskeletal: no deformity Skin:  no rash/petichiae Vascular:  Normal pulses all extremities Filed Vitals:   07/27/15 1429  BP: 113/71  Pulse: 71   Neurologic Exam Mental Status: Awake and fully alert. Oriented to place and time. Recent and remote memory intact. Attention span, concentration and fund of knowledge appropriate. Mood and affect appropriate.  Cranial Nerves: Fundoscopic exam reveals sharp disc margins. Pupils equal, briskly reactive to light. Extraocular movements full without nystagmus. Visual fields full to confrontation. Hearing intact. Facial sensation intact. Face, tongue, palate moves normally and symmetrically.  Motor: Normal bulk and tone. Normal strength in all tested extremity muscles. Sensory.: intact to touch ,pinprick .position and vibratory sensation.  Coordination: Rapid alternating movements normal in all extremities. Finger-to-nose and heel-to-shin performed accurately bilaterally. Gait and Station: Arises from chair without difficulty. Stance is normal. Gait demonstrates normal stride length and balance . Able to heel, toe and tandem walk without difficulty.  Reflexes: 1+ and symmetric. Toes downgoing.   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    PLAN: I had a Jafri d/w patient about her recent stroke, risk for recurrent stroke/TIAs, personally independently reviewed imaging studies and stroke evaluation results and answered questions.Continue aspirin 81 mg daily  for secondary stroke prevention  and maintain strict control of hypertension with blood pressure goal below 130/90, diabetes with hemoglobin A1c goal below 6.5% and lipids with LDL cholesterol goal below 70 mg/dL. I strongly counseled her to quit smoking completely. I also advised the patient to eat a healthy diet with plenty of whole grains, cereals, fruits and vegetables, exercise regularly and maintain ideal body weight. She was encouraged to find herself a primary care physician to be followed up with regularly. We also discuss briefly her interest in possible participation in the [place] stroke trial and she will be given written information to review at home and decide.Greater than 50% of time during this 25 minute visit was spent on counseling,explanation of diagnosis, planning of further management, discussion with patient and family and coordination of care  Followup in the future with me in 6 months or call earlier if necessary.  Antony Contras, MD Note: This document was prepared with digital dictation and possible smart phrase technology. Any transcriptional errors that result from this process are unintentional

## 2015-07-27 NOTE — Patient Instructions (Signed)
I had a Danielle Washington d/w patient about her recent stroke, risk for recurrent stroke/TIAs, personally independently reviewed imaging studies and stroke evaluation results and answered questions.Continue aspirin 81 mg daily  for secondary stroke prevention and maintain strict control of hypertension with blood pressure goal below 130/90, diabetes with hemoglobin A1c goal below 6.5% and lipids with LDL cholesterol goal below 70 mg/dL. I strongly counseled her to quit smoking completely. I also advised the patient to eat a healthy diet with plenty of whole grains, cereals, fruits and vegetables, exercise regularly and maintain ideal body weight. She was encouraged to find herself a primary care physician to be followed up with regularly. We also discuss briefly her interest in possible participation in the [employer] stroke trial and she will be given written information to review at home and decide. Followup in the future with me in 6 months or call earlier if necessary. Stroke Prevention Some medical conditions and behaviors are associated with an increased chance of having a stroke. You may prevent a stroke by making healthy choices and managing medical conditions. HOW CAN I REDUCE MY RISK OF HAVING A STROKE?   Stay physically active. Get at least 30 minutes of activity on most or all days.  Do not smoke. It may also be helpful to avoid exposure to secondhand smoke.  Limit alcohol use. Moderate alcohol use is considered to be:  No more than 2 drinks per day for men.  No more than 1 drink per day for nonpregnant women.  Eat healthy foods. This involves:  Eating 5 or more servings of fruits and vegetables a day.  Making dietary changes that address high blood pressure (hypertension), high cholesterol, diabetes, or obesity.  Manage your cholesterol levels.  Making food choices that are high in fiber and low in saturated fat, trans fat, and cholesterol may control cholesterol levels.  Take any  prescribed medicines to control cholesterol as directed by your health care provider.  Manage your diabetes.  Controlling your carbohydrate and sugar intake is recommended to manage diabetes.  Take any prescribed medicines to control diabetes as directed by your health care provider.  Control your hypertension.  Making food choices that are low in salt (sodium), saturated fat, trans fat, and cholesterol is recommended to manage hypertension.  Ask your health care provider if you need treatment to lower your blood pressure. Take any prescribed medicines to control hypertension as directed by your health care provider.  If you are 6318-41 years of age, have your blood pressure checked every 3-5 years. If you are 41 years of age or older, have your blood pressure checked every year.  Maintain a healthy weight.  Reducing calorie intake and making food choices that are low in sodium, saturated fat, trans fat, and cholesterol are recommended to manage weight.  Stop drug abuse.  Avoid taking birth control pills.  Talk to your health care provider about the risks of taking birth control pills if you are over 41 years old, smoke, get migraines, or have ever had a blood clot.  Get evaluated for sleep disorders (sleep apnea).  Talk to your health care provider about getting a sleep evaluation if you snore a lot or have excessive sleepiness.  Take medicines only as directed by your health care provider.  For some people, aspirin or blood thinners (anticoagulants) are helpful in reducing the risk of forming abnormal blood clots that can lead to stroke. If you have the irregular heart rhythm of atrial fibrillation, you should  be on a blood thinner unless there is a good reason you cannot take them.  Understand all your medicine instructions.  Make sure that other conditions (such as anemia or atherosclerosis) are addressed. SEEK IMMEDIATE MEDICAL CARE IF:   You have sudden weakness or numbness  of the face, arm, or leg, especially on one side of the body.  Your face or eyelid droops to one side.  You have sudden confusion.  You have trouble speaking (aphasia) or understanding.  You have sudden trouble seeing in one or both eyes.  You have sudden trouble walking.  You have dizziness.  You have a loss of balance or coordination.  You have a sudden, severe headache with no known cause.  You have new chest pain or an irregular heartbeat. Any of these symptoms may represent a serious problem that is an emergency. Do not wait to see if the symptoms will go away. Get medical help at once. Call your local emergency services (911 in [place] Do not drive yourself to the hospital.   This information is not intended to replace advice given to you by your health care provider. Make sure you discuss any questions you have with your health care provider.   Document Released: 09/15/2004 Document Revised: 08/29/2014 Document Reviewed: 02/08/2013 Elsevier Interactive Patient Education [employer]

## 2015-08-30 LAB — HM PAP SMEAR: HM Pap smear: DETECTED

## 2015-09-05 ENCOUNTER — Emergency Department (HOSPITAL_COMMUNITY): Payer: Medicaid Other

## 2015-09-05 ENCOUNTER — Inpatient Hospital Stay (HOSPITAL_COMMUNITY)
Admission: EM | Admit: 2015-09-05 | Discharge: 2015-09-09 | DRG: 100 | Disposition: A | Discharge status: Home / Self Care | Payer: Medicaid Other | Attending: Internal Medicine | Admitting: Internal Medicine

## 2015-09-05 ENCOUNTER — Encounter (HOSPITAL_COMMUNITY): Payer: Self-pay | Admitting: Emergency Medicine

## 2015-09-05 DIAGNOSIS — T17908A Unspecified foreign body in respiratory tract, part unspecified causing other injury, initial encounter: Secondary | ICD-10-CM | POA: Insufficient documentation

## 2015-09-05 DIAGNOSIS — G40409 Other generalized epilepsy and epileptic syndromes, not intractable, without status epilepticus: Principal | ICD-10-CM | POA: Diagnosis present

## 2015-09-05 DIAGNOSIS — K920 Hematemesis: Secondary | ICD-10-CM | POA: Diagnosis present

## 2015-09-05 DIAGNOSIS — Z7982 Long term (current) use of aspirin: Secondary | ICD-10-CM

## 2015-09-05 DIAGNOSIS — F1910 Other psychoactive substance abuse, uncomplicated: Secondary | ICD-10-CM | POA: Diagnosis not present

## 2015-09-05 DIAGNOSIS — K219 Gastro-esophageal reflux disease without esophagitis: Secondary | ICD-10-CM | POA: Diagnosis present

## 2015-09-05 DIAGNOSIS — Z823 Family history of stroke: Secondary | ICD-10-CM

## 2015-09-05 DIAGNOSIS — E669 Obesity, unspecified: Secondary | ICD-10-CM | POA: Diagnosis present

## 2015-09-05 DIAGNOSIS — G4089 Other seizures: Secondary | ICD-10-CM | POA: Diagnosis present

## 2015-09-05 DIAGNOSIS — J45909 Unspecified asthma, uncomplicated: Secondary | ICD-10-CM | POA: Diagnosis present

## 2015-09-05 DIAGNOSIS — Z8673 Personal history of transient ischemic attack (TIA), and cerebral infarction without residual deficits: Secondary | ICD-10-CM

## 2015-09-05 DIAGNOSIS — Z720 Tobacco use: Secondary | ICD-10-CM | POA: Diagnosis present

## 2015-09-05 DIAGNOSIS — K2901 Acute gastritis with bleeding: Secondary | ICD-10-CM | POA: Diagnosis not present

## 2015-09-05 DIAGNOSIS — J189 Pneumonia, unspecified organism: Secondary | ICD-10-CM

## 2015-09-05 DIAGNOSIS — R569 Unspecified convulsions: Secondary | ICD-10-CM

## 2015-09-05 DIAGNOSIS — F1590 Other stimulant use, unspecified, uncomplicated: Secondary | ICD-10-CM | POA: Diagnosis present

## 2015-09-05 DIAGNOSIS — G92 Toxic encephalopathy: Secondary | ICD-10-CM | POA: Diagnosis present

## 2015-09-05 DIAGNOSIS — F1123 Opioid dependence with withdrawal: Secondary | ICD-10-CM | POA: Diagnosis present

## 2015-09-05 DIAGNOSIS — Z6829 Body mass index (BMI) 29.0-29.9, adult: Secondary | ICD-10-CM

## 2015-09-05 DIAGNOSIS — I10 Essential (primary) hypertension: Secondary | ICD-10-CM | POA: Diagnosis present

## 2015-09-05 DIAGNOSIS — Z793 Long term (current) use of hormonal contraceptives: Secondary | ICD-10-CM

## 2015-09-05 DIAGNOSIS — E876 Hypokalemia: Secondary | ICD-10-CM | POA: Diagnosis not present

## 2015-09-05 DIAGNOSIS — Z8249 Family history of ischemic heart disease and other diseases of the circulatory system: Secondary | ICD-10-CM

## 2015-09-05 DIAGNOSIS — I6783 Posterior reversible encephalopathy syndrome: Secondary | ICD-10-CM | POA: Diagnosis present

## 2015-09-05 DIAGNOSIS — K922 Gastrointestinal hemorrhage, unspecified: Secondary | ICD-10-CM | POA: Diagnosis present

## 2015-09-05 DIAGNOSIS — F1210 Cannabis abuse, uncomplicated: Secondary | ICD-10-CM | POA: Diagnosis present

## 2015-09-05 LAB — CBC WITH DIFFERENTIAL/PLATELET
BASOS ABS: 0 10*3/uL (ref 0.0–0.1)
Basophils Relative: 0 %
EOS ABS: 0 10*3/uL (ref 0.0–0.7)
EOS PCT: 0 %
HCT: 45.2 % (ref 36.0–46.0)
Hemoglobin: 14.8 g/dL (ref 12.0–15.0)
Lymphocytes Relative: 19 %
Lymphs Abs: 2.3 10*3/uL (ref 0.7–4.0)
MCH: 30.7 pg (ref 26.0–34.0)
MCHC: 32.7 g/dL (ref 30.0–36.0)
MCV: 93.8 fL (ref 78.0–100.0)
Monocytes Absolute: 0.6 10*3/uL (ref 0.1–1.0)
Monocytes Relative: 5 %
Neutro Abs: 9.2 10*3/uL — ABNORMAL HIGH (ref 1.7–7.7)
Neutrophils Relative %: 76 %
PLATELETS: 261 10*3/uL (ref 150–400)
RBC: 4.82 MIL/uL (ref 3.87–5.11)
RDW: 15.7 % — ABNORMAL HIGH (ref 11.5–15.5)
WBC: 12.1 10*3/uL — AB (ref 4.0–10.5)

## 2015-09-05 LAB — URINE MICROSCOPIC-ADD ON
BACTERIA UA: NONE SEEN
SQUAMOUS EPITHELIAL / LPF: NONE SEEN

## 2015-09-05 LAB — URINALYSIS, ROUTINE W REFLEX MICROSCOPIC
Bilirubin Urine: NEGATIVE
GLUCOSE, UA: 100 mg/dL — AB
Ketones, ur: 15 mg/dL — AB
Leukocytes, UA: NEGATIVE
Nitrite: NEGATIVE
Protein, ur: 100 mg/dL — AB
SPECIFIC GRAVITY, URINE: 1.025 (ref 1.005–1.030)
pH: 7 (ref 5.0–8.0)

## 2015-09-05 LAB — RAPID URINE DRUG SCREEN, HOSP PERFORMED
AMPHETAMINES: POSITIVE — AB
BENZODIAZEPINES: NOT DETECTED
Barbiturates: NOT DETECTED
Cocaine: NOT DETECTED
OPIATES: POSITIVE — AB
Tetrahydrocannabinol: POSITIVE — AB

## 2015-09-05 LAB — PREGNANCY, URINE: Preg Test, Ur: NEGATIVE

## 2015-09-05 MED ORDER — LORAZEPAM 2 MG/ML IJ SOLN
1.0000 mg | INTRAMUSCULAR | Status: DC | PRN
  Administered 2015-09-06: 1 mg via INTRAVENOUS
  Filled 2015-09-05: qty 1

## 2015-09-05 MED ORDER — ENOXAPARIN SODIUM 40 MG/0.4ML ~~LOC~~ SOLN
40.0000 mg | Freq: Every day | SUBCUTANEOUS | Status: DC
  Filled 2015-09-05: qty 0.4

## 2015-09-05 MED ORDER — ONDANSETRON HCL 4 MG/2ML IJ SOLN
4.0000 mg | Freq: Four times a day (QID) | INTRAMUSCULAR | Status: DC | PRN
  Administered 2015-09-06 – 2015-09-09 (×3): 4 mg via INTRAVENOUS
  Filled 2015-09-05 (×3): qty 2

## 2015-09-05 MED ORDER — HYDROCHLOROTHIAZIDE 25 MG PO TABS: 25.0000 mg | ORAL_TABLET | Freq: Every day | ORAL | Status: DC

## 2015-09-05 MED ORDER — PANTOPRAZOLE SODIUM 40 MG PO TBEC: 40.0000 mg | DELAYED_RELEASE_TABLET | Freq: Every day | ORAL | Status: DC

## 2015-09-05 MED ORDER — SODIUM CHLORIDE 0.9 % IV SOLN
1000.0000 mg | Freq: Once | INTRAVENOUS | Status: AC
  Administered 2015-09-05: 1000 mg via INTRAVENOUS
  Filled 2015-09-05: qty 10

## 2015-09-05 MED ORDER — ONDANSETRON HCL 4 MG PO TABS: 4.0000 mg | ORAL_TABLET | Freq: Four times a day (QID) | ORAL | Status: DC | PRN

## 2015-09-05 MED ORDER — SODIUM CHLORIDE 0.9 % IV SOLN
3.0000 g | Freq: Four times a day (QID) | INTRAVENOUS | Status: DC
  Administered 2015-09-06 – 2015-09-08 (×9): 3 g via INTRAVENOUS
  Filled 2015-09-05 (×16): qty 3

## 2015-09-05 MED ORDER — ASPIRIN EC 81 MG PO TBEC: 81.0000 mg | DELAYED_RELEASE_TABLET | Freq: Every day | ORAL | Status: DC

## 2015-09-05 MED ORDER — SODIUM CHLORIDE 0.9 % IJ SOLN
3.0000 mL | Freq: Two times a day (BID) | INTRAMUSCULAR | Status: DC
  Administered 2015-09-06 – 2015-09-08 (×4): 3 mL via INTRAVENOUS

## 2015-09-05 MED ORDER — IPRATROPIUM-ALBUTEROL 0.5-2.5 (3) MG/3ML IN SOLN
3.0000 mL | Freq: Four times a day (QID) | RESPIRATORY_TRACT | Status: DC
  Administered 2015-09-06: 3 mL via RESPIRATORY_TRACT
  Filled 2015-09-05: qty 3

## 2015-09-05 MED ORDER — LORAZEPAM 2 MG/ML IJ SOLN
1.0000 mg | Freq: Once | INTRAMUSCULAR | Status: AC
  Administered 2015-09-05: 1 mg via INTRAVENOUS
  Filled 2015-09-05: qty 1

## 2015-09-05 MED ORDER — IBUPROFEN 400 MG PO TABS: 400.0000 mg | ORAL_TABLET | Freq: Four times a day (QID) | ORAL | Status: DC | PRN

## 2015-09-05 NOTE — ED Notes (Signed)
Family Phone Numbers to call if needed. Lupita RaiderChristopher Almquist [phone] Dalmatiahristian Brim [phone]

## 2015-09-05 NOTE — Progress Notes (Signed)
ANTIBIOTIC CONSULT NOTE - INITIAL  Pharmacy Consult for Unasyn Indication: asp pna  No Known Allergies  Patient Measurements: Height: 5' 1.5" (156.2 cm) Weight: 160 lb (72.576 kg) IBW/kg (Calculated) : 48.95  Vital Signs: Temp: 98.2 F (36.8 C) (01/14 2057) Temp Source: Oral (01/14 2057) BP: 169/94 mmHg (01/14 2215) Pulse Rate: 103 (01/14 2215) Intake/Output from previous day:   Intake/Output from this shift:    Labs:  Recent Labs  09/05/15 2200  WBC 12.1*  HGB 14.8  PLT 261   CrCl cannot be calculated (Patient has no serum creatinine result on file.). No results for input(s): VANCOTROUGH, VANCOPEAK, VANCORANDOM, GENTTROUGH, GENTPEAK, GENTRANDOM, TOBRATROUGH, TOBRAPEAK, TOBRARND, AMIKACINPEAK, AMIKACINTROU, AMIKACIN in the last 72 hours.   Microbiology: No results found for this or any previous visit (from the past 720 hour(s)).  Medical History: Past Medical History  Diagnosis Date  . Hypertension   . Asthma   . Stroke [facility](HCC)     Medications:  See electronic med rec  Assessment: 40 y.o. F presents with new onset sz activity. To begin Unasyn for asp pna. WBC elevated to 12.1. Afeb. SCr 1.22, est CrCl 55 ml/min.  Goal of Therapy:  Resolution of infection  Plan:  Continue Unasyn 3gm IV q6h Will f/u renal function, micro data, and pt's clinical condition  Christoper Fabianaron Nijee Heatwole, PharmD, BCPS Clinical pharmacist, pager [phone] 09/05/2015,11:42 PM

## 2015-09-05 NOTE — ED Notes (Signed)
Admitting physician and family at the bedside at this time.

## 2015-09-05 NOTE — ED Provider Notes (Signed)
Medical screening examination/treatment/procedure(s) were conducted as a shared visit with non-physician practitioner(s) and myself.  I personally evaluated the patient during the encounter.   EKG Interpretation   Date/Time:  Saturday September 05 2015 20:56:27 EST Ventricular Rate:  88 PR Interval:  115 QRS Duration: 76 QT Interval:  400 QTC Calculation: 484 R Axis:   55 Text Interpretation:  Sinus arrhythmia Multiple ventricular premature  complexes Borderline short PR interval Probable left atrial enlargement  Probable left ventricular hypertrophy Confirmed by Kyle Luppino  MD-J, Riyansh Gerstner  ([id number]) on 09/05/2015 9:07:42 PM      Pt presents to the ED with recurrent seizures.  Pt has history of recent stroke.  Probable seizure at home.  In the ED she has had two witnessed seizures.  Ativan was ordered after the first but she had another one before the medication could be given.  IV keppra ordered as well.  Dr Hosie PoissonSumner , neurology consulted.  Will monitor closely.  2308  Pt is still somnolent although no further seizure activity.  Vitals stable.  CT and CMET are pending.  Pt will be admitted for further evaluation and observation.  Linwood DibblesJon Damonie Furney, MD 09/05/15 [phone]

## 2015-09-05 NOTE — ED Provider Notes (Signed)
CSN: [id number]     Arrival date & time 09/05/15  2049 History   First MD Initiated Contact with Patient 09/05/15 2055     Chief Complaint  Patient presents with  . Seizures     (Consider location/radiation/quality/duration/timing/severity/associated sxs/prior Treatment) HPI Comments: Patient with a history of recent stroke (06/2015), HTN, tobacco abuse and asthma presents via EMS with complaint of multiple seizures that started late today. The family at bedside states she has had vomiting today without known fever and complaint of headache. Late this afternoon she had what is described as seizure activity affecting the face and right arm, lasting less than one minute. Episodes recurred multiple times, including on EMS arrival with report of being able to talk during the episode causing question of actual seizure activity. No urinary incontinence, no tongue biting. On arrival in the emergency department the patient is in a full grand mal seizure with evidence of tongue biting. Seizure activity lasted less than one minute with second seizure within 10 minutes, also lasting less than a minute. Patient did not regain baseline status in between seizures.   Patient is a 40 y.o. female presenting with seizures. The history is provided by the patient. No language interpreter was used.  Seizures Seizure activity on arrival: yes   Seizure type:  Grand mal Initial focality:  Facial   Past Medical History  Diagnosis Date  . Hypertension   . Asthma   . Stroke [facility])    Past Surgical History  Procedure Laterality Date  . No past surgeries    . Tee without cardioversion N/A 05/26/2015    Procedure: TRANSESOPHAGEAL ECHOCARDIOGRAM (TEE);  Surgeon: Quintella Reichert, MD;  Location: [facility] ENDOSCOPY;  Service: Cardiovascular;  Laterality: N/A;   Family History  Problem Relation Age of Onset  . Lung cancer Mother   . Heart attack Father   . Stroke Maternal Grandmother    Social History  Substance Use  Topics  . Smoking status: Current Every Day Smoker -- 1.00 packs/day for 20 years  . Smokeless tobacco: None  . Alcohol Use: 1.2 oz/week    1 Glasses of wine, 1 Shots of liquor per week     Comment: occasionally   OB History    No data available     Review of Systems  Unable to perform ROS: Patient nonverbal  Neurological: Positive for seizures.      Allergies  Review of patient's allergies indicates no known allergies.  Home Medications   Prior to Admission medications   Medication Sig Start Date End Date Taking? Authorizing Provider  aspirin EC 81 MG EC tablet Take 1 tablet (81 mg total) by mouth daily. 05/25/15   Christiane Ha, MD  atorvastatin (LIPITOR) 20 MG tablet Take 1 tablet (20 mg total) by mouth daily at 6 PM. 07/27/15   Micki Riley, MD  hydrochlorothiazide (HYDRODIURIL) 25 MG tablet Take 25 mg by mouth daily.    Historical Provider, MD  ibuprofen (ADVIL,MOTRIN) 400 MG tablet Take 400 mg by mouth every 6 (six) hours as needed for mild pain.    Historical Provider, MD  medroxyPROGESTERone (DEPO-PROVERA) 150 MG/ML injection Inject 150 mg into the muscle every 3 (three) months.    Historical Provider, MD   BP 172/128 mmHg  Pulse 85  Temp(Src) 98.2 F (36.8 C) (Oral)  Resp 19  Ht 5' 1.5" (1.562 m)  Wt 72.576 kg  BMI 29.75 kg/m2  SpO2 100% Physical Exam  Constitutional: She appears well-developed and well-nourished.  HENT:  Head: Atraumatic.  Cardiovascular: Normal rate.   Pulmonary/Chest: Effort normal.  Abdominal: Soft. She exhibits no mass.  Neurological:  Patient in active grand mal seizure in ED limiting initial neurologic evaluation.  Skin: Skin is warm and dry.    ED Course  Procedures (including critical care time) Labs Review Labs Reviewed  COMPREHENSIVE METABOLIC PANEL  CBC WITH DIFFERENTIAL/PLATELET  URINE RAPID DRUG SCREEN, HOSP PERFORMED  PREGNANCY, URINE  URINALYSIS, ROUTINE W REFLEX MICROSCOPIC ([facility])    Imaging  Review No results found. I have personally reviewed and evaluated these images and lab results as part of my medical decision-making.   EKG Interpretation   Date/Time:  Saturday September 05 2015 20:56:27 EST Ventricular Rate:  88 PR Interval:  115 QRS Duration: 76 QT Interval:  400 QTC Calculation: 484 R Axis:   55 Text Interpretation:  Sinus arrhythmia Multiple ventricular premature  complexes Borderline short PR interval Probable left atrial enlargement  Probable left ventricular hypertrophy Confirmed by KNAPP  MD-J, JON  ([id number]) on 09/05/2015 9:07:42 PM      MDM   Final diagnoses:  None    1. New-onset seizures  The patient is given Ativan IV. Labs pending. Neurology at bedside for consultation.  Hospitalist paged and accepts the patient for admission. VSS. IV Keppra started as advised by neurology. No further seizure activity - will continue to observe     Elpidio AnisShari Mylez Venable, PA-C 09/05/15 2232

## 2015-09-05 NOTE — ED Notes (Signed)
Brought in by EMS.  Called out by family for stroke like symptoms.  Reports twitching and staring off to the right.  Denies any fall or LOC.  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  Reports having a headache now.  Alert and oriented and answering questions appropriately.

## 2015-09-05 NOTE — ED Notes (Signed)
Patient arrived back from Ct.  Alert and answering questions for a minute then falls back to sleep quickly.  Family went home for the night.  To continue to  Monitor.

## 2015-09-05 NOTE — H&P (Signed)
[facility] History and Physical  Danielle MountRenee Riso JYN:[id number] DOB: 1976-03-14 DOA: 09/05/2015  Referring physician: Linwood DibblesJon Knapp, M.D. PCP: No primary care provider on file.   Chief Complaint: Seizures  HPI: Danielle Washington is a 40 y.o. female with a past medical history hypertension, asthma, CVA in October 2016 who was brought to the emergency department via EMS after family called for what they thought may be strokelike symptoms. They report had multiple episodes of vomiting today with  abdominal pain, but no diarrhea, constipation, melena or hematochezia. They deny fever, or hearing the patient complaining of work vision, headaches, slurred speech or difficulty with language comprehension. However, they report seeing the patient twitching and staring off to the right multiple times in episodes that would last a minute or less. EMS witnessed one of the episodes while they were initially interviewing the patient. No history of fall or trauma.  In the ER, the patient was alert, oriented and answering questions appropriately. Subsequently, while being evaluated by the ER staff, the patient started having generalized tonic-clonic seizures. She was subsequently medicated with Ativan and Keppra and seizures subsided. Currently, the patient is in no acute distress and sedated due to antiepileptic medications.   Review of Systems:  Unable to review  Past Medical History  Diagnosis Date  . Hypertension   . Asthma   . Stroke [facility](HCC)    Past Surgical History  Procedure Laterality Date  . No past surgeries    . Tee without cardioversion N/A 05/26/2015    Procedure: TRANSESOPHAGEAL ECHOCARDIOGRAM (TEE);  Surgeon: Quintella Reichertraci R Turner, MD;  [facility];  Service: Cardiovascular;  Laterality: N/A;   Social History:  reports that she has been smoking.  She does not have any smokeless tobacco history on file. She reports that she drinks about 1.2 oz of alcohol per week. She reports that she does not  use illicit drugs.  No Known Allergies  Family History  Problem Relation Age of Onset  . Lung cancer Mother   . Heart attack Father   . Stroke Maternal Grandmother     Prior to Admission medications   Medication Sig Start Date End Date Taking? Authorizing Provider  aspirin EC 81 MG EC tablet Take 1 tablet (81 mg total) by mouth daily. 05/25/15  Yes Christiane Haorinna L Sullivan, MD  atorvastatin (LIPITOR) 20 MG tablet Take 1 tablet (20 mg total) by mouth daily at 6 PM. 07/27/15  Yes Micki RileyPramod S Sethi, MD  esomeprazole (NEXIUM) 40 MG capsule Take 40 mg by mouth daily at 12 noon.   Yes Historical Provider, MD  ibuprofen (ADVIL,MOTRIN) 400 MG tablet Take 800 mg by mouth 2 (two) times daily as needed for headache or moderate pain.    Yes Historical Provider, MD  medroxyPROGESTERone (DEPO-PROVERA) 150 MG/ML injection Inject 150 mg into the muscle every 3 (three) months.   Yes Historical Provider, MD  hydrochlorothiazide (HYDRODIURIL) 25 MG tablet Take 25 mg by mouth daily.    Historical Provider, MD   Physical Exam: Filed Vitals:   09/05/15 2100 09/05/15 2130 09/05/15 2200 09/05/15 2215  BP:  136/76 174/108 169/94  Pulse: 85 101 94 103  Temp:      TempSrc:      Resp: 19 23 23 20   Height:      Weight:      SpO2: 100% 100% 100% 100%    Wt Readings from Last 3 Encounters:  09/05/15 72.576 kg (160 lb)  07/27/15 71.94 kg (158 lb 9.6 oz)  05/23/15 75.751 kg (167 lb)    General:  Somnolent, but appears calm and comfortable Eyes: PERRL, normal lids, irises & conjunctiva ENT: grossly normal hearing, lips & tongue Neck: no LAD, masses or thyromegaly Cardiovascular: RRR, no m/r/g. No LE edema. Telemetry: SR, no arrhythmias  Respiratory: Left upper lobe rhonchi and coarse sounds. Abdomen: soft, ntnd Skin: no rash or induration seen on limited exam Musculoskeletal: Decreased overall muscle tone due to sedation.Marland Kitchen Psychiatric: Somnolent. Neurologic: Postictal. Sedated.           CBC:  Recent  Labs Lab 09/05/15 2200  WBC 12.1*  NEUTROABS 9.2*  HGB 14.8  HCT 45.2  MCV 93.8  PLT 261    Urine rapid drug screen (hosp performed) [[id number]] (Abnormal) Collected: 09/05/15 2210    Updated: 09/05/15 2330    Specimen Type: Urine    Specimen Source: Urine, Catheterized     Opiates POSITIVE (A)    Cocaine NONE DETECTED    Benzodiazepines NONE DETECTED    Amphetamines POSITIVE (A)    Tetrahydrocannabinol POSITIVE (A)    Barbiturates NONE DETECTED   Urine microscopic-add on [[id number]] Collected: 09/05/15 2210   Updated: 09/05/15 2304     Squamous Epithelial / LPF NONE SEEN    WBC, UA 0-5 WBC/hpf    RBC / HPF 6-30 RBC/hpf    Bacteria, UA NONE SEEN    Urine-Other MUCOUS PRESENT   Urinalysis, Routine w reflex microscopic [[id number]] (Abnormal) Collected: 09/05/15 2210   Updated: 09/05/15 2303    Specimen Type: Urine    Specimen Source: Urine, Catheterized     Color, Urine YELLOW    APPearance CLEAR    Specific Gravity, Urine 1.025    pH 7.0    Glucose, UA 100 (A) mg/dL    Hgb urine dipstick MODERATE (A)    Bilirubin Urine NEGATIVE    Ketones, ur 15 (A) mg/dL    Protein, ur 528 (A) mg/dL    Nitrite NEGATIVE    Leukocytes, UA NEGATIVE   Pregnancy, urine [[id number]] Collected: 09/05/15 2210   Updated: 09/05/15 2253    Specimen Type: Urine    Specimen Source: Urine, Catheterized     Preg Test, Ur NEGATIVE     BNP (last 3 results)  Recent Labs  05/23/15 0230  BNP 14.2    Radiological Exams on Admission: Dg Chest 1 View  09/05/2015  CLINICAL DATA:  40 year old female with seizures and stroke-like symptoms. EXAM: CHEST 1 VIEW COMPARISON:  None. FINDINGS: The heart size and mediastinal contours are within normal limits. Both lungs are clear. The visualized skeletal structures are unremarkable. IMPRESSION: No active disease. Electronically Signed   By: Elgie Collard M.D.   On: 09/05/2015 22:56    EKG: Independently reviewed.   Vent. rate 88 BPM PR interval 115 ms QRS duration 76 ms QT/QTc 400/484 ms P-R-T axes -16 55 58 Sinus arrhythmia Multiple ventricular premature complexes Borderline short PR interval Probable left atrial enlargement Probable left ventricular hypertrophy  Assessment/Plan Principal Problem:   Seizures, generalized convulsive (HCC) Admit to telemetry. Continue supplemental oxygen. Continue Keppra at present neurology. Ativan as needed for seizure activity. Further workup per neurology.  Active Problems:   Substance abuse Unable to be discussed further with the patient, due to her sedation secondary to antiepileptics.    Tobacco abuse Unable to offer nicotine replacement therapy at this time, due to patient's sedation.    GERD (gastroesophageal reflux disease) The new proton pump inhibitor.    Hypertension Continue hydrochlorothiazide  Asthma Supplemental oxygen as needed. Bronchodilators as needed.    Aspiration into respiratory tract Check chest radiograph. Bronchodilators as needed. Unasyn 3 g IVPB every 6 hours.   Neurology is on consult.   Code Status: Full code. DVT Prophylaxis: Lovenox SQ. Family Communication:  Reyne Dumas [phone]                                          Daughter in law, Dennie Bible [phone] Disposition Plan: Admit for anticonvulsive therapy and further workup.  Time spent: Over 70 minutes were spent in the process of this admission.  Bobette Mo, M.D. [facility] Pager [phone].

## 2015-09-05 NOTE — Consult Note (Signed)
Consult Reason for Consult: seizures Referring Physician: Dr Tomi Bamberger  CC: seizures  HPI: Danielle Washington is an 40 y.o. female hx of cryptogenic stroke presenting with new onset seizure activity. Per daughter, patient was in usual state of health until 0200 when she reported not feeling well and developed nausea and vomiting. Around 0800 noted to have head turning to the right with shaking, lasted around 1 minutes or less. Daughter reports she was able to communicate during the event. Had 2 more similar episodes at home so EMS called. In ED noted to have 2 further episodes which appeared to be GTC in nature. Episodes self-resolved. After 2nd ED episode received 19m of ativan and loaded with keppra.   Lab and imaging results pending. MRI brain from 05/2015 reviewed, shows acute infarcts in right frontal lobe and mid superior left frontal lobe. Question of vasculitis brought up, CT angio head and neck at that time did not find any signs of vasculitis, had normal CRP, HIV, RPR ESR, ANA at that time.   Past Medical History  Diagnosis Date  . Hypertension   . Asthma   . Stroke ([facility]     Past Surgical History  Procedure Laterality Date  . No past surgeries    . Tee without cardioversion N/A 05/26/2015    Procedure: TRANSESOPHAGEAL ECHOCARDIOGRAM (TEE);  Surgeon: TSueanne Margarita MD;  [facility];  Service: Cardiovascular;  Laterality: N/A;    Family History  Problem Relation Age of Onset  . Lung cancer Mother   . Heart attack Father   . Stroke Maternal Grandmother     Social History:  reports that she has been smoking.  She does not have any smokeless tobacco history on file. She reports that she drinks about 1.2 oz of alcohol per week. She reports that she does not use illicit drugs.  No Known Allergies  Medications: I have reviewed the patient's current medications.  ROS: Out of a complete 14 system review, the patient complains of only the following symptoms, and all other  reviewed systems are negative. +seizures  Physical Examination: Filed Vitals:   09/05/15 2059 09/05/15 2100  BP: 172/128   Pulse: 94 85  Temp:    Resp: 15 19   Physical Exam  Constitutional: He appears well-developed and well-nourished.  Psych: Affect appropriate to situation Eyes: No scleral injection HENT: No OP obstrucion Head: Normocephalic.  Cardiovascular: Normal rate and regular rhythm.  Respiratory: Effort normal and breath sounds normal.  GI: Soft. Bowel sounds are normal. No distension. There is no tenderness.  Skin: WDI  Neurologic Examination Mental Status: Eyes closed, will briefly open eyes to noxious stimuli, becomes agitated. Briefly followed commands (squeeze left hand) Cranial Nerves: II: funable to visualize optic discs, blinks to threat bilaterally, pupils equal, round, reactive to light  III,IV, VI: ptosis not present, eyes midline, no gaze deviation V,VII: face symmetric, facial light touch sensation normal bilaterally VIII: unable to test IX,X: gag reflex present XI: unable to test XII: unable to test Motor: No spontaneous movement, withdrawals briskly to noxious stimuli in all extremities Tone and bulk:normal tone throughout; no atrophy noted Sensory: see above Deep Tendon Reflexes: 2+ and symmetric throughout Plantars: Right: mute   Left: mute Cerebellar: Unable to test Gait: unable to test  Laboratory Studies:   Basic Metabolic Panel: No results for input(s): NA, K, CL, CO2, GLUCOSE, BUN, CREATININE, CALCIUM, MG, PHOS in the last 168 hours.  Liver Function Tests: No results for input(s): AST, ALT, ALKPHOS, BILITOT,  PROT, ALBUMIN in the last 168 hours. No results for input(s): LIPASE, AMYLASE in the last 168 hours. No results for input(s): AMMONIA in the last 168 hours.  CBC: No results for input(s): WBC, NEUTROABS, HGB, HCT, MCV, PLT in the last 168 hours.  Cardiac Enzymes: No results for input(s): CKTOTAL, CKMB, CKMBINDEX,  TROPONINI in the last 168 hours.  BNP: Invalid input(s): POCBNP  CBG: No results for input(s): GLUCAP in the last 168 hours.  Microbiology: Results for orders placed or performed during the hospital encounter of 02/07/07  Wet prep, genital     Status: Abnormal   Collection Time: 02/07/07  2:17 PM  Result Value Ref Range Status   Yeast Wet Prep HPF POC NONE SEEN  Final   Trich, Wet Prep NONE SEEN  Final   Clue Cells Wet Prep HPF POC FEW (A)  Final   WBC, Wet Prep HPF POC NONE SEEN  Final    Coagulation Studies: No results for input(s): LABPROT, INR in the last 72 hours.  Urinalysis: No results for input(s): COLORURINE, LABSPEC, PHURINE, GLUCOSEU, HGBUR, BILIRUBINUR, KETONESUR, PROTEINUR, UROBILINOGEN, NITRITE, LEUKOCYTESUR in the last 168 hours.  Invalid input(s): APPERANCEUR  Lipid Panel:     Component Value Date/Time   CHOL 151 05/24/2015 0550   TRIG 61 05/24/2015 0550   HDL 41 05/24/2015 0550   CHOLHDL 3.7 05/24/2015 0550   VLDL 12 05/24/2015 0550   LDLCALC 98 05/24/2015 0550    HgbA1C:  Lab Results  Component Value Date   HGBA1C 5.8* 05/24/2015    Urine Drug Screen:      Component Value Date/Time   LABOPIA POSITIVE* 05/23/2015 0120   COCAINSCRNUR NONE DETECTED 05/23/2015 0120   LABBENZ NONE DETECTED 05/23/2015 0120   AMPHETMU POSITIVE* 05/23/2015 0120   THCU POSITIVE* 05/23/2015 0120   LABBARB NONE DETECTED 05/23/2015 0120    Alcohol Level: No results for input(s): ETH in the last 168 hours.  Other results:  Imaging: No results found.   Assessment/Plan:  40y/o woman with history of cryptogenic stroke presenting with new onset seizures. Unclear etiology. May be provoked secondary to reported nausea and vomiting. Exam appears non-focal but cannot rule out new infarct triggering seizures. Prior imaging raised question of vasculitis though further workup was not consistent with this.   -stat CT head  -CBC, CMP, UA, UDS -keppra load in the ED. Start  555m Q12 -seizure precautions -if CT unremarkable will plan for MRI brain -EEG   PJim Like DO [facility] [phone] If 7pm- 7am, please page neurology on call as listed in [place] 09/05/2015, 10:10 PM

## 2015-09-05 NOTE — ED Notes (Signed)
Called into room by provider.  Patient actively seizing at this time.  IV started per protocol.  O2 applied.  Suctioned small amount of bloody saliva from mouth.  Seizure precautions followed.

## 2015-09-06 ENCOUNTER — Observation Stay (HOSPITAL_COMMUNITY): Payer: Medicaid Other

## 2015-09-06 DIAGNOSIS — J45909 Unspecified asthma, uncomplicated: Secondary | ICD-10-CM | POA: Diagnosis present

## 2015-09-06 DIAGNOSIS — F1510 Other stimulant abuse, uncomplicated: Secondary | ICD-10-CM | POA: Diagnosis not present

## 2015-09-06 DIAGNOSIS — Z793 Long term (current) use of hormonal contraceptives: Secondary | ICD-10-CM | POA: Diagnosis not present

## 2015-09-06 DIAGNOSIS — E669 Obesity, unspecified: Secondary | ICD-10-CM | POA: Diagnosis present

## 2015-09-06 DIAGNOSIS — Z720 Tobacco use: Secondary | ICD-10-CM | POA: Diagnosis not present

## 2015-09-06 DIAGNOSIS — G40409 Other generalized epilepsy and epileptic syndromes, not intractable, without status epilepticus: Secondary | ICD-10-CM | POA: Diagnosis present

## 2015-09-06 DIAGNOSIS — I6783 Posterior reversible encephalopathy syndrome: Secondary | ICD-10-CM | POA: Diagnosis present

## 2015-09-06 DIAGNOSIS — Z8673 Personal history of transient ischemic attack (TIA), and cerebral infarction without residual deficits: Secondary | ICD-10-CM | POA: Diagnosis not present

## 2015-09-06 DIAGNOSIS — E876 Hypokalemia: Secondary | ICD-10-CM | POA: Diagnosis not present

## 2015-09-06 DIAGNOSIS — K922 Gastrointestinal hemorrhage, unspecified: Secondary | ICD-10-CM | POA: Diagnosis present

## 2015-09-06 DIAGNOSIS — R569 Unspecified convulsions: Secondary | ICD-10-CM | POA: Diagnosis present

## 2015-09-06 DIAGNOSIS — Z6829 Body mass index (BMI) 29.0-29.9, adult: Secondary | ICD-10-CM | POA: Diagnosis not present

## 2015-09-06 DIAGNOSIS — F1590 Other stimulant use, unspecified, uncomplicated: Secondary | ICD-10-CM | POA: Diagnosis present

## 2015-09-06 DIAGNOSIS — G92 Toxic encephalopathy: Secondary | ICD-10-CM | POA: Diagnosis present

## 2015-09-06 DIAGNOSIS — I10 Essential (primary) hypertension: Secondary | ICD-10-CM | POA: Diagnosis not present

## 2015-09-06 DIAGNOSIS — F1210 Cannabis abuse, uncomplicated: Secondary | ICD-10-CM | POA: Diagnosis present

## 2015-09-06 DIAGNOSIS — K219 Gastro-esophageal reflux disease without esophagitis: Secondary | ICD-10-CM | POA: Diagnosis not present

## 2015-09-06 DIAGNOSIS — F17200 Nicotine dependence, unspecified, uncomplicated: Secondary | ICD-10-CM | POA: Diagnosis not present

## 2015-09-06 DIAGNOSIS — Z8249 Family history of ischemic heart disease and other diseases of the circulatory system: Secondary | ICD-10-CM | POA: Diagnosis not present

## 2015-09-06 DIAGNOSIS — F1910 Other psychoactive substance abuse, uncomplicated: Secondary | ICD-10-CM | POA: Diagnosis not present

## 2015-09-06 DIAGNOSIS — Z7982 Long term (current) use of aspirin: Secondary | ICD-10-CM | POA: Diagnosis not present

## 2015-09-06 DIAGNOSIS — K2901 Acute gastritis with bleeding: Secondary | ICD-10-CM | POA: Diagnosis not present

## 2015-09-06 DIAGNOSIS — Z823 Family history of stroke: Secondary | ICD-10-CM | POA: Diagnosis not present

## 2015-09-06 DIAGNOSIS — G4089 Other seizures: Secondary | ICD-10-CM | POA: Diagnosis present

## 2015-09-06 DIAGNOSIS — F1123 Opioid dependence with withdrawal: Secondary | ICD-10-CM | POA: Diagnosis present

## 2015-09-06 DIAGNOSIS — K920 Hematemesis: Secondary | ICD-10-CM | POA: Diagnosis present

## 2015-09-06 DIAGNOSIS — J4520 Mild intermittent asthma, uncomplicated: Secondary | ICD-10-CM | POA: Diagnosis not present

## 2015-09-06 LAB — CBC
HCT: 44.8 % (ref 36.0–46.0)
Hemoglobin: 14.9 g/dL (ref 12.0–15.0)
MCH: 30.7 pg (ref 26.0–34.0)
MCHC: 33.3 g/dL (ref 30.0–36.0)
MCV: 92.4 fL (ref 78.0–100.0)
PLATELETS: 274 10*3/uL (ref 150–400)
RBC: 4.85 MIL/uL (ref 3.87–5.11)
RDW: 15.8 % — AB (ref 11.5–15.5)
WBC: 12.3 10*3/uL — AB (ref 4.0–10.5)

## 2015-09-06 LAB — CBC WITH DIFFERENTIAL/PLATELET
BASOS PCT: 0 %
Basophils Absolute: 0 10*3/uL (ref 0.0–0.1)
Basophils Absolute: 0 10*3/uL (ref 0.0–0.1)
Basophils Relative: 0 %
EOS ABS: 0 10*3/uL (ref 0.0–0.7)
Eosinophils Absolute: 0 10*3/uL (ref 0.0–0.7)
Eosinophils Relative: 0 %
Eosinophils Relative: 0 %
HCT: 39 % (ref 36.0–46.0)
HEMATOCRIT: 38.2 % (ref 36.0–46.0)
HEMOGLOBIN: 13.3 g/dL (ref 12.0–15.0)
HEMOGLOBIN: 13.1 g/dL (ref 12.0–15.0)
LYMPHS ABS: 0.9 10*3/uL (ref 0.7–4.0)
LYMPHS ABS: 1.4 10*3/uL (ref 0.7–4.0)
LYMPHS PCT: 8 %
Lymphocytes Relative: 7 %
MCH: 30.8 pg (ref 26.0–34.0)
MCH: 30.8 pg (ref 26.0–34.0)
MCHC: 34.1 g/dL (ref 30.0–36.0)
MCHC: 34.3 g/dL (ref 30.0–36.0)
MCV: 90.3 fL (ref 78.0–100.0)
MCV: 89.9 fL (ref 78.0–100.0)
MONO ABS: 1 10*3/uL (ref 0.1–1.0)
MONO ABS: 1 10*3/uL (ref 0.1–1.0)
MONOS PCT: 8 %
MONOS PCT: 6 %
NEUTROS ABS: 14.5 10*3/uL — AB (ref 1.7–7.7)
NEUTROS PCT: 85 %
NEUTROS PCT: 86 %
Neutro Abs: 11.1 10*3/uL — ABNORMAL HIGH (ref 1.7–7.7)
Platelets: 233 10*3/uL (ref 150–400)
Platelets: 230 10*3/uL (ref 150–400)
RBC: 4.32 MIL/uL (ref 3.87–5.11)
RBC: 4.25 MIL/uL (ref 3.87–5.11)
RDW: 15.8 % — ABNORMAL HIGH (ref 11.5–15.5)
RDW: 15.7 % — ABNORMAL HIGH (ref 11.5–15.5)
WBC: 13 10*3/uL — ABNORMAL HIGH (ref 4.0–10.5)
WBC: 17 10*3/uL — ABNORMAL HIGH (ref 4.0–10.5)

## 2015-09-06 LAB — COMPREHENSIVE METABOLIC PANEL
ALK PHOS: 66 U/L (ref 38–126)
ALT: <5 U/L — ABNORMAL LOW (ref 14–54)
ALT: 9 U/L — ABNORMAL LOW (ref 14–54)
ANION GAP: 15 (ref 5–15)
AST: 14 U/L — ABNORMAL LOW (ref 15–41)
Albumin: 4.4 g/dL (ref 3.5–5.0)
Albumin: 3.7 g/dL (ref 3.5–5.0)
Alkaline Phosphatase: 80 U/L (ref 38–126)
Anion gap: 29 — ABNORMAL HIGH (ref 5–15)
BILIRUBIN TOTAL: 0.7 mg/dL (ref 0.3–1.2)
BILIRUBIN TOTAL: 0.7 mg/dL (ref 0.3–1.2)
BUN: 12 mg/dL (ref 6–20)
BUN: 11 mg/dL (ref 6–20)
CO2: 13 mmol/L — ABNORMAL LOW (ref 22–32)
CO2: 28 mmol/L (ref 22–32)
CREATININE: 1.22 mg/dL — AB (ref 0.44–1.00)
CREATININE: 0.67 mg/dL (ref 0.44–1.00)
Calcium: 9.9 mg/dL (ref 8.9–10.3)
Calcium: 9 mg/dL (ref 8.9–10.3)
Chloride: 98 mmol/L — ABNORMAL LOW (ref 101–111)
Chloride: 102 mmol/L (ref 101–111)
GFR calc Af Amer: >60 mL/min (ref >60)
GFR calc non Af Amer: >60 mL/min (ref >60)
GFR, EST NON AFRICAN AMERICAN: 54 mL/min — AB (ref >60)
GLUCOSE: 148 mg/dL — AB (ref 65–99)
Glucose, Bld: 234 mg/dL — ABNORMAL HIGH (ref 65–99)
Potassium: 2.7 mmol/L — CL (ref 3.5–5.1)
Potassium: 3.3 mmol/L — ABNORMAL LOW (ref 3.5–5.1)
SODIUM: 140 mmol/L (ref 135–145)
SODIUM: 145 mmol/L (ref 135–145)
TOTAL PROTEIN: 7.9 g/dL (ref 6.5–8.1)
Total Protein: 7.1 g/dL (ref 6.5–8.1)

## 2015-09-06 LAB — CREATININE, SERUM
CREATININE: 1.13 mg/dL — AB (ref 0.44–1.00)
GFR, EST NON AFRICAN AMERICAN: 60 mL/min — AB (ref >60)

## 2015-09-06 LAB — LACTIC ACID, PLASMA
LACTIC ACID, VENOUS: 1.4 mmol/L (ref 0.5–2.0)
Lactic Acid, Venous: 18.9 mmol/L (ref 0.5–2.0)
Lactic Acid, Venous: 1.5 mmol/L (ref 0.5–2.0)
Lactic Acid, Venous: 1.4 mmol/L (ref 0.5–2.0)

## 2015-09-06 LAB — I-STAT VENOUS BLOOD GAS, ED
Acid-base deficit: 14 mmol/L — ABNORMAL HIGH (ref 0.0–2.0)
Bicarbonate: 13.2 mEq/L — ABNORMAL LOW (ref 20.0–24.0)
O2 SAT: 98 %
PCO2 VEN: 35.4 mmHg — AB (ref 45.0–50.0)
PO2 VEN: 127 mmHg — AB (ref 30.0–45.0)
TCO2: 14 mmol/L (ref 0–100)
pH, Ven: 7.179 — CL (ref 7.250–7.300)

## 2015-09-06 LAB — AMMONIA: AMMONIA: 35 umol/L (ref 9–35)

## 2015-09-06 LAB — TYPE AND SCREEN
ABO/RH(D): O POS
Antibody Screen: NEGATIVE

## 2015-09-06 LAB — ABO/RH: ABO/RH(D): O POS

## 2015-09-06 LAB — MRSA PCR SCREENING: MRSA BY PCR: NEGATIVE

## 2015-09-06 LAB — ETHANOL

## 2015-09-06 LAB — PHOSPHORUS: Phosphorus: 5.6 mg/dL — ABNORMAL HIGH (ref 2.5–4.6)

## 2015-09-06 LAB — MAGNESIUM: MAGNESIUM: 2.2 mg/dL (ref 1.7–2.4)

## 2015-09-06 MED ORDER — METHADONE HCL 5 MG PO TABS: 5.0000 mg | ORAL_TABLET | Freq: Two times a day (BID) | ORAL | Status: DC

## 2015-09-06 MED ORDER — POTASSIUM CHLORIDE IN NACL 20-0.9 MEQ/L-% IV SOLN
INTRAVENOUS | Status: AC
  Administered 2015-09-06 (×2): via INTRAVENOUS
  Filled 2015-09-06 (×2): qty 1000

## 2015-09-06 MED ORDER — POTASSIUM CHLORIDE IN NACL 20-0.9 MEQ/L-% IV SOLN
INTRAVENOUS | Status: DC
  Filled 2015-09-06 (×2): qty 1000

## 2015-09-06 MED ORDER — SODIUM CHLORIDE 0.9 % IV SOLN
500.0000 mg | Freq: Two times a day (BID) | INTRAVENOUS | Status: DC
  Administered 2015-09-06 – 2015-09-08 (×5): 500 mg via INTRAVENOUS
  Filled 2015-09-06 (×7): qty 5

## 2015-09-06 MED ORDER — POTASSIUM CHLORIDE IN NACL 20-0.9 MEQ/L-% IV SOLN
INTRAVENOUS | Status: DC
  Administered 2015-09-06: 500 mL/h via INTRAVENOUS
  Filled 2015-09-06 (×2): qty 1000

## 2015-09-06 MED ORDER — PANTOPRAZOLE SODIUM 40 MG IV SOLR
40.0000 mg | Freq: Two times a day (BID) | INTRAVENOUS | Status: DC
  Administered 2015-09-06 – 2015-09-08 (×6): 40 mg via INTRAVENOUS
  Filled 2015-09-06 (×6): qty 40

## 2015-09-06 MED ORDER — CLONIDINE HCL 0.1 MG PO TABS
0.2000 mg | ORAL_TABLET | Freq: Three times a day (TID) | ORAL | Status: DC
  Administered 2015-09-06 – 2015-09-08 (×6): 0.2 mg via ORAL
  Filled 2015-09-06 (×3): qty 2
  Filled 2015-09-06: qty 1
  Filled 2015-09-06: qty 2
  Filled 2015-09-06: qty 1

## 2015-09-06 MED ORDER — PROMETHAZINE HCL 25 MG/ML IJ SOLN
12.5000 mg | INTRAMUSCULAR | Status: DC | PRN
  Administered 2015-09-06 – 2015-09-07 (×2): 12.5 mg via INTRAVENOUS
  Filled 2015-09-06 (×2): qty 1

## 2015-09-06 MED ORDER — METHADONE HCL 10 MG/ML IJ SOLN: 5.0000 mg | Freq: Four times a day (QID) | INTRAMUSCULAR | Status: DC | PRN

## 2015-09-06 MED ORDER — LORAZEPAM 2 MG/ML IJ SOLN: 2.0000 mg | INTRAMUSCULAR | Status: DC | PRN

## 2015-09-06 MED ORDER — IPRATROPIUM-ALBUTEROL 0.5-2.5 (3) MG/3ML IN SOLN: 3.0000 mL | RESPIRATORY_TRACT | Status: DC | PRN

## 2015-09-06 MED ORDER — SODIUM CHLORIDE 0.9 % IV SOLN: INTRAVENOUS | Status: DC

## 2015-09-06 MED ORDER — POTASSIUM CHLORIDE IN NACL 20-0.9 MEQ/L-% IV SOLN
INTRAVENOUS | Status: DC
  Administered 2015-09-06 – 2015-09-08 (×4): via INTRAVENOUS
  Filled 2015-09-06 (×7): qty 1000

## 2015-09-06 MED ORDER — LORAZEPAM 2 MG/ML IJ SOLN
1.0000 mg | INTRAMUSCULAR | Status: DC | PRN
  Administered 2015-09-06: 2 mg via INTRAVENOUS
  Filled 2015-09-06: qty 1

## 2015-09-06 NOTE — ED Notes (Signed)
Paged admitting physician with no return call received.  To try again.

## 2015-09-06 NOTE — ED Notes (Signed)
MRI called to ask if pt was able to come to MRI attempt x2, due to pt restlessness MRI told RN would page admitting and follow up.

## 2015-09-06 NOTE — ED Notes (Signed)
Dr. Mahala MenghiniSamtani notified of pt behavior and MRI request, according to MD, MRI will be done tomorrow and behavioral status of pt noted.

## 2015-09-06 NOTE — ED Notes (Signed)
At this moment the pt appears to be a little more lucid. She opened her eyes and asked for water again  i explained why they did not want her to  Drink anything.  She responded ok and turned back over.  i told her she would be going upstairs soon   She replied good

## 2015-09-06 NOTE — ED Notes (Signed)
Vomited small amount coffee ground emesis.  Medicated per order with phenergan.  Continues to be very restless all over the bed.  To continue to monitor.

## 2015-09-06 NOTE — Progress Notes (Signed)
Patient ID: Danielle MountRenee Washington, female   DOB: 1975-10-13, 40 y.o.   MRN: [id number]   I was notified by the staff about the patient's lactic acid results. She also had an episode of emesis with coffee-ground material, however the patient had blood in her mouth after seizure, but she just told the staff that she has a history of H. pylori infection which has given her symptoms in the past.   Plan:  Continue vigorous IV hydration, follow by maintenance hydration. Follow-up lactic acid level. Nothing by mouth. Follow-up hematocrit and hemoglobin. Check type and screen with next blood draw.     Lactic acid, plasma [[id number]] (Abnormal) Collected: 09/06/15 0100   Updated: 09/06/15 0248    Specimen Type: Blood     Lactic Acid, Venous 18.9 (HH) mmol/L   Phosphorus [[id number]] (Abnormal) Collected: 09/06/15 0100   Updated: 09/06/15 0220    Specimen Type: Blood     Phosphorus 5.6 (H) mg/dL   Magnesium [[id number]][[id number]] Collected: 09/06/15 0100   Updated: 09/06/15 [id number]     Sanda Kleinavid Azaylia Fong, M.D.

## 2015-09-06 NOTE — ED Notes (Signed)
Questioned patient regarding the use of opiates at home.  Reports taking roxi 10mg  atleast every day.  Reports getting these "off the street".

## 2015-09-06 NOTE — ED Notes (Signed)
Another cousin has showed up  2 others were here earlier.  A female has been calling on the phone at first saying she was the daughter.  The pt does not have a daughter.  She called again gining another name.  i was unable to talk either time

## 2015-09-06 NOTE — Progress Notes (Signed)
episodic large emesis looking bloody Patient more awake Passing urine on bed however No IV formulation Methadone per pharmacy  P  will consult GI r/o bleed Rpt CBC stat Would hold oral meds-keep NPO If further vomit, consider NG-tube or as per GI   Pleas KochJai Render Marley, MD [facility] [phone]

## 2015-09-06 NOTE — ED Notes (Signed)
Report given to rn on 2600 

## 2015-09-06 NOTE — Significant Event (Signed)
Called to speak with Family noted in chart in attemtp to get some collaterall info  Carter,Candy Relative [phone]    No answer  Pleas KochJai Margo Lama, MD [facility] [phone]) [phone]

## 2015-09-06 NOTE — ED Notes (Signed)
The pt just used her call bell  And asked for something to drink  Npo except for po meds

## 2015-09-06 NOTE — ED Notes (Signed)
Danielle Washington(Son) [id number]  **please call with any updates, will call for updates on mother. Stay out of town.**

## 2015-09-06 NOTE — Consult Note (Signed)
Reason for Consult: Hematemesis Referring Physician: [facility]  Avian Emery HPI: This is a 40 year old female with a PMH of CVA, asthma, and HTN admitted for stroke-like symptoms.  This was witnessed by the family and in the ER she had a tonic-clonic seizure.  This issue is now under control with mediation.  She had two episodes of hematemesis last evening and this morning and she remains hemodynamically stable.  The HGB is in the 13-14 range.  She was reported to have vomiting with abdominal pain prior to admission, but no reports of hematochezia or melena.  All the history was obtained from the chart.  Past Medical History  Diagnosis Date  . Hypertension   . Asthma   . Stroke [facility])     Past Surgical History  Procedure Laterality Date  . No past surgeries    . Tee without cardioversion N/A 05/26/2015    Procedure: TRANSESOPHAGEAL ECHOCARDIOGRAM (TEE);  Surgeon: Quintella Reichert, MD;  Location: [facility];  Service: Cardiovascular;  Laterality: N/A;    Family History  Problem Relation Age of Onset  . Lung cancer Mother   . Heart attack Father   . Stroke Maternal Grandmother     Social History:  reports that she has been smoking.  She does not have any smokeless tobacco history on file. She reports that she drinks about 1.2 oz of alcohol per week. She reports that she does not use illicit drugs.  Allergies: No Known Allergies  Medications:  Scheduled: . cloNIDine  0.2 mg Oral TID  . ipratropium-albuterol  3 mL Nebulization Q6H  . pantoprazole (PROTONIX) IV  40 mg Intravenous Q12H  . sodium chloride  3 mL Intravenous Q12H   Continuous: . 0.9 % NaCl with KCl 20 mEq / L 75 mL/hr at 09/06/15 0954  . ampicillin-sulbactam (UNASYN) IV Stopped (09/06/15 0837)  . levETIRAcetam Stopped (09/06/15 1308)    Results for orders placed or performed during the hospital encounter of 09/05/15 (from the past 24 hour(s))  Comprehensive metabolic panel     Status: Abnormal   Collection  Time: 09/05/15 10:00 PM  Result Value Ref Range   Sodium 140 135 - 145 mmol/L   Potassium 2.7 (LL) 3.5 - 5.1 mmol/L   Chloride 98 (L) 101 - 111 mmol/L   CO2 13 (L) 22 - 32 mmol/L   Glucose, Bld 234 (H) 65 - 99 mg/dL   BUN 12 6 - 20 mg/dL   Creatinine, Ser 6.57 (H) 0.44 - 1.00 mg/dL   Calcium 9.9 8.9 - 84.6 mg/dL   Total Protein 7.9 6.5 - 8.1 g/dL   Albumin 4.4 3.5 - 5.0 g/dL   AST <5 (L) 15 - 41 U/L   ALT <5 (L) 14 - 54 U/L   Alkaline Phosphatase 80 38 - 126 U/L   Total Bilirubin 0.7 0.3 - 1.2 mg/dL   GFR calc non Af Amer 54 (L) >60 mL/min   GFR calc Af Amer >60 >60 mL/min   Anion gap 29 (H) 5 - 15  CBC with Differential     Status: Abnormal   Collection Time: 09/05/15 10:00 PM  Result Value Ref Range   WBC 12.1 (H) 4.0 - 10.5 K/uL   RBC 4.82 3.87 - 5.11 MIL/uL   Hemoglobin 14.8 12.0 - 15.0 g/dL   HCT 96.2 95.2 - 84.1 %   MCV 93.8 78.0 - 100.0 fL   MCH 30.7 26.0 - 34.0 pg   MCHC 32.7 30.0 - 36.0 g/dL  RDW 15.7 (H) 11.5 - 15.5 %   Platelets 261 150 - 400 K/uL   Neutrophils Relative % 76 %   Neutro Abs 9.2 (H) 1.7 - 7.7 K/uL   Lymphocytes Relative 19 %   Lymphs Abs 2.3 0.7 - 4.0 K/uL   Monocytes Relative 5 %   Monocytes Absolute 0.6 0.1 - 1.0 K/uL   Eosinophils Relative 0 %   Eosinophils Absolute 0.0 0.0 - 0.7 K/uL   Basophils Relative 0 %   Basophils Absolute 0.0 0.0 - 0.1 K/uL  Urine rapid drug screen (hosp performed)     Status: Abnormal   Collection Time: 09/05/15 10:10 PM  Result Value Ref Range   Opiates POSITIVE (A) NONE DETECTED   Cocaine NONE DETECTED NONE DETECTED   Benzodiazepines NONE DETECTED NONE DETECTED   Amphetamines POSITIVE (A) NONE DETECTED   Tetrahydrocannabinol POSITIVE (A) NONE DETECTED   Barbiturates NONE DETECTED NONE DETECTED  Pregnancy, urine     Status: None   Collection Time: 09/05/15 10:10 PM  Result Value Ref Range   Preg Test, Ur NEGATIVE NEGATIVE  Urinalysis, Routine w reflex microscopic     Status: Abnormal   Collection Time:  09/05/15 10:10 PM  Result Value Ref Range   Color, Urine YELLOW YELLOW   APPearance CLEAR CLEAR   Specific Gravity, Urine 1.025 1.005 - 1.030   pH 7.0 5.0 - 8.0   Glucose, UA 100 (A) NEGATIVE mg/dL   Hgb urine dipstick MODERATE (A) NEGATIVE   Bilirubin Urine NEGATIVE NEGATIVE   Ketones, ur 15 (A) NEGATIVE mg/dL   Protein, ur 161100 (A) NEGATIVE mg/dL   Nitrite NEGATIVE NEGATIVE   Leukocytes, UA NEGATIVE NEGATIVE  Urine microscopic-add on     Status: None   Collection Time: 09/05/15 10:10 PM  Result Value Ref Range   Squamous Epithelial / LPF NONE SEEN NONE SEEN   WBC, UA 0-5 0 - 5 WBC/hpf   RBC / HPF 6-30 0 - 5 RBC/hpf   Bacteria, UA NONE SEEN NONE SEEN   Urine-Other MUCOUS PRESENT   Magnesium     Status: None   Collection Time: 09/06/15  1:00 AM  Result Value Ref Range   Magnesium 2.2 1.7 - 2.4 mg/dL  Phosphorus     Status: Abnormal   Collection Time: 09/06/15  1:00 AM  Result Value Ref Range   Phosphorus 5.6 (H) 2.5 - 4.6 mg/dL  Ethanol     Status: None   Collection Time: 09/06/15  1:00 AM  Result Value Ref Range   Alcohol, Ethyl (B) <5 <5 mg/dL  CBC     Status: Abnormal   Collection Time: 09/06/15  1:00 AM  Result Value Ref Range   WBC 12.3 (H) 4.0 - 10.5 K/uL   RBC 4.85 3.87 - 5.11 MIL/uL   Hemoglobin 14.9 12.0 - 15.0 g/dL   HCT 09.644.8 04.536.0 - 40.946.0 %   MCV 92.4 78.0 - 100.0 fL   MCH 30.7 26.0 - 34.0 pg   MCHC 33.3 30.0 - 36.0 g/dL   RDW 81.115.8 (H) 91.411.5 - 78.215.5 %   Platelets 274 150 - 400 K/uL  Creatinine, serum     Status: Abnormal   Collection Time: 09/06/15  1:00 AM  Result Value Ref Range   Creatinine, Ser 1.13 (H) 0.44 - 1.00 mg/dL   GFR calc non Af Amer 60 (L) >60 mL/min   GFR calc Af Amer >60 >60 mL/min  Lactic acid, plasma     Status: Abnormal  Collection Time: 09/06/15  1:00 AM  Result Value Ref Range   Lactic Acid, Venous 18.9 (HH) 0.5 - 2.0 mmol/L  I-Stat venous blood gas, ED     Status: Abnormal   Collection Time: 09/06/15  1:13 AM  Result Value Ref  Range   pH, Ven 7.179 (LL) 7.250 - 7.300   pCO2, Ven 35.4 (L) 45.0 - 50.0 mmHg   pO2, Ven 127.0 (H) 30.0 - 45.0 mmHg   Bicarbonate 13.2 (L) 20.0 - 24.0 mEq/L   TCO2 14 0 - 100 mmol/L   O2 Saturation 98.0 %   Acid-base deficit 14.0 (H) 0.0 - 2.0 mmol/L   Patient temperature HIDE    Sample type VENOUS    Comment NOTIFIED PHYSICIAN   Lactic acid, plasma     Status: None   Collection Time: 09/06/15  4:07 AM  Result Value Ref Range   Lactic Acid, Venous 1.5 0.5 - 2.0 mmol/L  Type and screen [facility]     Status: None   Collection Time: 09/06/15  4:07 AM  Result Value Ref Range   ABO/RH(D) O POS    Antibody Screen NEG    Sample Expiration 09/09/2015   ABO/Rh     Status: None   Collection Time: 09/06/15  4:07 AM  Result Value Ref Range   ABO/RH(D) O POS   CBC WITH DIFFERENTIAL     Status: Abnormal   Collection Time: 09/06/15  4:08 AM  Result Value Ref Range   WBC 13.0 (H) 4.0 - 10.5 K/uL   RBC 4.32 3.87 - 5.11 MIL/uL   Hemoglobin 13.3 12.0 - 15.0 g/dL   HCT 16.1 09.6 - 04.5 %   MCV 90.3 78.0 - 100.0 fL   MCH 30.8 26.0 - 34.0 pg   MCHC 34.1 30.0 - 36.0 g/dL   RDW 40.9 (H) 81.1 - 91.4 %   Platelets 233 150 - 400 K/uL   Neutrophils Relative % 85 %   Neutro Abs 11.1 (H) 1.7 - 7.7 K/uL   Lymphocytes Relative 7 %   Lymphs Abs 0.9 0.7 - 4.0 K/uL   Monocytes Relative 8 %   Monocytes Absolute 1.0 0.1 - 1.0 K/uL   Eosinophils Relative 0 %   Eosinophils Absolute 0.0 0.0 - 0.7 K/uL   Basophils Relative 0 %   Basophils Absolute 0.0 0.0 - 0.1 K/uL  Comprehensive metabolic panel     Status: Abnormal   Collection Time: 09/06/15  4:08 AM  Result Value Ref Range   Sodium 145 135 - 145 mmol/L   Potassium 3.3 (L) 3.5 - 5.1 mmol/L   Chloride 102 101 - 111 mmol/L   CO2 28 22 - 32 mmol/L   Glucose, Bld 148 (H) 65 - 99 mg/dL   BUN 11 6 - 20 mg/dL   Creatinine, Ser 7.82 0.44 - 1.00 mg/dL   Calcium 9.0 8.9 - 95.6 mg/dL   Total Protein 7.1 6.5 - 8.1 g/dL   Albumin 3.7 3.5  - 5.0 g/dL   AST 14 (L) 15 - 41 U/L   ALT 9 (L) 14 - 54 U/L   Alkaline Phosphatase 66 38 - 126 U/L   Total Bilirubin 0.7 0.3 - 1.2 mg/dL   GFR calc non Af Amer >60 >60 mL/min   GFR calc Af Amer >60 >60 mL/min   Anion gap 15 5 - 15     Dg Chest 1 View  09/05/2015  CLINICAL DATA:  40 year old female with seizures and stroke-like symptoms.  EXAM: CHEST 1 VIEW COMPARISON:  None. FINDINGS: The heart size and mediastinal contours are within normal limits. Both lungs are clear. The visualized skeletal structures are unremarkable. IMPRESSION: No active disease. Electronically Signed   By: Elgie Collard M.D.   On: 09/05/2015 22:56   Ct Head Wo Contrast  09/05/2015  CLINICAL DATA:  Acute onset of recurrent seizures. Initial encounter. EXAM: CT HEAD WITHOUT CONTRAST TECHNIQUE: Contiguous axial images were obtained from the base of the skull through the vertex without intravenous contrast. COMPARISON:  CT of the head and MRI of the brain performed 05/23/2015 FINDINGS: There is no evidence of acute infarction, mass lesion, or intra- or extra-axial hemorrhage on CT. There has been interval evolution of the previously noted acute infarcts at the high parietal lobes bilaterally, more posterior on the right. The brainstem and fourth ventricle are within normal limits. The basal ganglia are unremarkable in appearance. No mass effect or midline shift is seen. There is no evidence of fracture; visualized osseous structures are unremarkable in appearance. The orbits are within normal limits. The paranasal sinuses and mastoid air cells are well-aerated. No significant soft tissue abnormalities are seen. IMPRESSION: 1. No acute intracranial pathology seen on CT. 2. Interval evolution of previously noted infarcts at the high parietal lobes bilaterally, more posterior on the right. No new infarct seen on CT. Electronically Signed   By: Roanna Raider M.D.   On: 09/05/2015 23:42    ROS:  As stated above in the HPI  otherwise negative.  Blood pressure 158/104, pulse 95, temperature 98.8 F (37.1 C), temperature source Oral, resp. rate 15, height 5' 1.5" (1.562 m), weight 72.576 kg (160 lb), SpO2 100 %.    PE: Gen: Altered, thrashing around in her bed.  Assessment/Plan: 1) Hematemesis. 2) Seizures. 3) Canniboid abuse. 4) Nausea/vomiting.   I am unable to assess the patient as this time as she has altered mental status.  She is thrashing around in her bed wildly.  The hematemesis may be from a Mallory-Weiss tear with the reported history of nausea and vomiting.  The nausea vomiting and abdominal pain can be symptomatic from Canniboid Hyperemesis Syndrome.  I cannot assess this situation in her current state.  She is hemodynamically stable and her HGB is within the normal range.    Plan: 1) Agree with Stepdown or ICU care. 2) Follow HGB. 3) Transfuse as necessary.  Buelah Rennie D 09/06/2015, 10:20 AM

## 2015-09-06 NOTE — ED Notes (Signed)
Vomited large amount of dark liquid.  Medicated per order.  To continue to monitor.

## 2015-09-06 NOTE — ED Notes (Addendum)
Pt family at bedside, IV site wrapped in gauze due to pt bizarre behavior and flailing around in bed.

## 2015-09-06 NOTE — ED Notes (Signed)
Very anxious rolling all over the bed.  Incontinent of large amount of urine.  Linen changed at this time.  Medicated per order for anxiety at this time.  To continue to monitor.

## 2015-09-06 NOTE — Progress Notes (Signed)
Subjective: No further sz  Exam: Filed Vitals:   09/06/15 1145 09/06/15 1215  BP: 155/96 158/95  Pulse: 91 89  Temp:    Resp: 29 18   Gen: In bed, NAD Resp: non-labored breathing, no acute distress Abd: soft, nt  Neuro: MS: awake, alert, oriented to person, place, month, year. Appear restless states that she is "just trying to get comfortable" ZO:XWRUCN:EOMi Motor: MAEW Sensory:intact to LT  Impression: 40 yo F with h/o stroke and new onset seizures. She has been started on keppra without further seizure activity and I would continue this at this time.   Recommendations: 1)EEG 2) MRI brain 3) Continue keppra.   Ritta SlotMcNeill Chakita Mcgraw, MD [facility] [phone]  If 7pm- 7am, please page neurology on call as listed in AMION.

## 2015-09-06 NOTE — ED Notes (Signed)
The pt keeps both her eyes closed even when she is being asked questions.  She is starting to  Toss and turn  Her sons girlfirend has called again saying she is the daughter she was told the pt does not have a daughter

## 2015-09-06 NOTE — Progress Notes (Signed)
Danielle Washington PNT:[id number] DOB: 08/10/1976 DOA: 09/05/2015 PCP: No primary care provider on file.  Brief narrative:  40 y/o ? htn Asthma TOb abuse GERD Prior admission 05/25/15 with bilat Frontal lobe CVA-UDS + THC/Opiates/Amphetamine-was supposed to get TEE as OP Admitted to [facility] ED 09/05/15 with Toxic met encephalopathy + ? aspiration   Past medical history-As per Problem list Chart reviewed as below-   Consultants:  Neurology  Procedures:  none  Antibiotics:  Unasyn   Subjective   Seen in ED Patient moving around and flailing arms but awake Cannot tell me for sure where she is Gets the date right and thinks that this is Sunday Cannot tell me the date Knows that she is at the ED  Says she has no specific pain Nursing reports agitation as well and bizarre bhaviour Tells me she took Roxicodone a couple of days ago.  Also has been taking Oxy    Objective    Interim History:   Telemetry:    Objective: Filed Vitals:   09/06/15 0345 09/06/15 0615 09/06/15 0630 09/06/15 0715  BP: 181/95 167/90 166/92 121/109  Pulse: 107 103 103 112  Temp:      TempSrc:      Resp: _0 Height:      Weight:      SpO2: 100% 100% 100% 100%   No intake or output data in the 24 hours ending 09/06/15 0732  Exam:  General: awake but not oriented.  Pupils 4 mm bilaterally.  Dentures upper jaw.  No JVD.  No ict nor pallor Cardiovascular: s1 s2 tachycardic Respiratory: poor exam.  Sounds relatively clear Abdomen:  Soft nt nd no rebound no guard, obwese  Skin no le edema Neuro confused.  Agitated.  moving 4 limbs interspersed with episodes of sleep.  Data Reviewed: Basic Metabolic Panel:  Recent Labs Lab 09/05/15 2200 09/06/15 0100 09/06/15 0408  NA 140  --  145  K 2.7*  --  3.3*  CL 98*  --  102  CO2 13*  --  28  GLUCOSE 234*  --  148*  BUN 12  --  11  CREATININE 1.22* 1.13* 0.67  CALCIUM 9.9  --  9.0  MG  --  2.2  --   PHOS  --  5.6*  --    Liver  Function Tests:  Recent Labs Lab 09/05/15 2200 09/06/15 0408  AST <5* 14*  ALT <5* 9*  ALKPHOS 80 66  BILITOT 0.7 0.7  PROT 7.9 7.1  ALBUMIN 4.4 3.7   No results for input(s): LIPASE, AMYLASE in the last 168 hours. No results for input(s): AMMONIA in the last 168 hours. CBC:  Recent Labs Lab 09/05/15 2200 09/06/15 0100 09/06/15 0408  WBC 12.1* 12.3* 13.0*  NEUTROABS 9.2*  --  11.1*  HGB 14.8 14.9 13.3  HCT 45.2 44.8 39.0  MCV 93.8 92.4 90.3  PLT 261 274 233   Cardiac Enzymes: No results for input(s): CKTOTAL, CKMB, CKMBINDEX, TROPONINI in the last 168 hours. BNP: Invalid input(s): POCBNP CBG: No results for input(s): GLUCAP in the last 168 hours.  No results found for this or any previous visit (from the past 240 hour(s)).   Studies:              All Imaging reviewed and is as per above notation   Scheduled Meds: . aspirin EC  81 mg Oral Daily  . enoxaparin (LOVENOX) injection  40 mg Subcutaneous Daily  . hydrochlorothiazide  25 mg Oral Daily  . ipratropium-albuterol  3 mL Nebulization Q6H  . pantoprazole (PROTONIX) IV  40 mg Intravenous Q12H  . sodium chloride  3 mL Intravenous Q12H   Continuous Infusions: . 0.9 % NaCl with KCl 20 mEq / L    . ampicillin-sulbactam (UNASYN) IV Stopped (09/06/15 0122)     Assessment/Plan:  1. Probable opioid/BZD withdrawal./  Start Methadone IM q6 prn.  Will upgrade bed to telemetry and will need monitoring.  Add clonidine 0.2 mg tid to blunt the response of withdrawal.  Monitor COWS [opiates withdrawal score]  2. ? Aspiration 2/2 top Seizure and withdrawal-continue Unasyn IV.  Repeat 1 vw CXR 09/07/15.  Rpt CBC am.  cycle lactic acid. 3. Probable withdrawal Seizures-Loaded x 1 with Keppra 1o00 in ED.  Continue 500 IV q 12 for now.  Neurology has been consulted.  As Ct unremarkeable would MRI brain as per neurology.  4. Asthma-Inhlaers as prn.  no role for steroids nor inhalers right now as no active wheeze 5. Hematemesis  noted blood in mouth.  Had 1 time episode 1/14.no drop in hemeglobin  Cautiously advance top CLD.  monitor effect and reassess if prn   No family + Inpatient-patient needs monitoring, but doesn't require SDU level care currently    Verneita Griffes, MD  [facility] Pager [phone] 09/06/2015, 7:32 AM

## 2015-09-06 NOTE — ED Notes (Signed)
Pt family at bedside still, pt noted to be calm and resting at this time. Family asking patient if she knows her name and pt answering questions appropriately at this time.

## 2015-09-06 NOTE — ED Notes (Signed)
Family no longer at bedside, pt continues to flail around in bed with legs over side rails. RN went in to inform pt to reposition herself, which pt did without difficulty. Pt also reports she needed to use the restroom, pt placed on bedpan with no difficulty.

## 2015-09-06 NOTE — ED Notes (Signed)
Attending MD at bed side and witnessed Vomiting.

## 2015-09-06 NOTE — ED Notes (Signed)
The pt is  Sleeping unless she is disturbed.  She is alert oriented skin warm and dry.  No complaints of pain.   She was just cleaned up from some incontinence of urine.  Clean linens  Placed warm blankets

## 2015-09-06 NOTE — ED Notes (Signed)
Spoke with hospitalist regarding patient condition.  To come and see patient.

## 2015-09-06 NOTE — ED Notes (Signed)
Patient provided with water, per request

## 2015-09-06 NOTE — ED Notes (Signed)
Dr. Robb Matarrtiz made aware of coffee ground emesis and c/o stomach burning as well as lactic acid level.  To place orders.

## 2015-09-07 ENCOUNTER — Inpatient Hospital Stay (HOSPITAL_COMMUNITY): Payer: Medicaid Other

## 2015-09-07 DIAGNOSIS — R569 Unspecified convulsions: Secondary | ICD-10-CM

## 2015-09-07 DIAGNOSIS — F1510 Other stimulant abuse, uncomplicated: Secondary | ICD-10-CM

## 2015-09-07 DIAGNOSIS — J45909 Unspecified asthma, uncomplicated: Secondary | ICD-10-CM

## 2015-09-07 DIAGNOSIS — F1210 Cannabis abuse, uncomplicated: Secondary | ICD-10-CM

## 2015-09-07 DIAGNOSIS — I10 Essential (primary) hypertension: Secondary | ICD-10-CM

## 2015-09-07 DIAGNOSIS — F17200 Nicotine dependence, unspecified, uncomplicated: Secondary | ICD-10-CM

## 2015-09-07 DIAGNOSIS — K219 Gastro-esophageal reflux disease without esophagitis: Secondary | ICD-10-CM

## 2015-09-07 LAB — CBC WITH DIFFERENTIAL/PLATELET
BASOS ABS: 0 10*3/uL (ref 0.0–0.1)
BASOS PCT: 0 %
EOS ABS: 0 10*3/uL (ref 0.0–0.7)
EOS PCT: 0 %
HCT: 34 % — ABNORMAL LOW (ref 36.0–46.0)
Hemoglobin: 11.3 g/dL — ABNORMAL LOW (ref 12.0–15.0)
Lymphocytes Relative: 28 %
Lymphs Abs: 2.4 10*3/uL (ref 0.7–4.0)
MCH: 30.1 pg (ref 26.0–34.0)
MCHC: 33.2 g/dL (ref 30.0–36.0)
MCV: 90.7 fL (ref 78.0–100.0)
MONO ABS: 0.6 10*3/uL (ref 0.1–1.0)
Monocytes Relative: 7 %
Neutro Abs: 5.7 10*3/uL (ref 1.7–7.7)
Neutrophils Relative %: 65 %
PLATELETS: 146 10*3/uL — AB (ref 150–400)
RBC: 3.75 MIL/uL — ABNORMAL LOW (ref 3.87–5.11)
RDW: 16.1 % — AB (ref 11.5–15.5)
WBC: 8.7 10*3/uL (ref 4.0–10.5)

## 2015-09-07 LAB — CBC
HCT: 33.3 % — ABNORMAL LOW (ref 36.0–46.0)
HEMATOCRIT: 35.9 % — AB (ref 36.0–46.0)
HEMOGLOBIN: 11.3 g/dL — AB (ref 12.0–15.0)
Hemoglobin: 11.8 g/dL — ABNORMAL LOW (ref 12.0–15.0)
MCH: 31 pg (ref 26.0–34.0)
MCH: 30.4 pg (ref 26.0–34.0)
MCHC: 33.9 g/dL (ref 30.0–36.0)
MCHC: 32.9 g/dL (ref 30.0–36.0)
MCV: 91.5 fL (ref 78.0–100.0)
MCV: 92.5 fL (ref 78.0–100.0)
PLATELETS: 177 10*3/uL (ref 150–400)
PLATELETS: 195 10*3/uL (ref 150–400)
RBC: 3.64 MIL/uL — AB (ref 3.87–5.11)
RBC: 3.88 MIL/uL (ref 3.87–5.11)
RDW: 15.9 % — ABNORMAL HIGH (ref 11.5–15.5)
RDW: 15.7 % — ABNORMAL HIGH (ref 11.5–15.5)
WBC: 8.2 10*3/uL (ref 4.0–10.5)
WBC: 10.8 10*3/uL — AB (ref 4.0–10.5)

## 2015-09-07 LAB — HEMOGLOBIN A1C
HEMOGLOBIN A1C: 5.4 % (ref 4.8–5.6)
MEAN PLASMA GLUCOSE: 108 mg/dL

## 2015-09-07 MED ORDER — HYDROCHLOROTHIAZIDE 12.5 MG PO CAPS
12.5000 mg | ORAL_CAPSULE | Freq: Every day | ORAL | Status: DC
  Administered 2015-09-07 – 2015-09-08 (×2): 12.5 mg via ORAL
  Filled 2015-09-07 (×4): qty 1

## 2015-09-07 MED ORDER — GADOBENATE DIMEGLUMINE 529 MG/ML IV SOLN
15.0000 mL | Freq: Once | INTRAVENOUS | Status: AC
  Administered 2015-09-07: 15 mL via INTRAVENOUS

## 2015-09-07 MED ORDER — ALBUTEROL SULFATE (2.5 MG/3ML) 0.083% IN NEBU
3.0000 mL | INHALATION_SOLUTION | RESPIRATORY_TRACT | Status: DC | PRN
Start: 2015-09-07 — End: 2015-09-09

## 2015-09-07 NOTE — Progress Notes (Signed)
[facility] PROGRESS NOTE  Dylan Ruotolo IWP:[id number] DOB: [birth year] DOA: 09/05/2015 PCP: No primary care provider on file.   Brief narrative:   40yo woman with PMH of HTN, asthma and CVA in 2016 presented for stroke-like symptoms and found to have seizures in the ED and was treated with medications.  She further reported nausea and vomiting earlier in the day.   And also was found to have coffee ground emesis in the hospital (this has resolved).  CBC was trended and she did have a significant drop but this stabilized.   Today 1/16: She has had no further seizures.  She has had no further emesis.  She did not require a blood transfusion and her H/H have stabilized.  There is some concern for opiate withdrawal given her UDS with amphetamines, opiates and THC.  She was treated with for opiate withdrawal and her mental status is much improved.    EEG done today showed possible intermittent partial seizures.    MRI done today shows possible PRES or vasculitis   Assessment/Plan:    Seizures, generalized convulsive - Imaging today including EEG and MRI were interesting, showing possible intermittent seizures, possible PRES and/or vasculitis - For seizures - no further, she is on IV keppra and doing well.  Transition to PO tomorrow - For PRES - BP is better controlled on clonidine, continue this for now, consider weaning and transitioning to another medication starting tomorrow.  Anti-epileptics as noted above.  She is + for amphetamines and THC in the urine: in case reports amphetamines have been shown to cause PRES.  Counsel on cessation - For vascultis - Check ESR, CRP, viral hepatitis panel.  Cr normal, CBC diff WNL, AST/ALT low, urine with red blood cells, repeat with sediment - Continue keppra - Neurology note reviewed - Unclear if infection played a role, on unasyn - will d/c for now as there is no evidence of infiltrate on most recent CXR.   Hematemesis - Resolved at this time, CBC  stable - Possibly related to retching vs. Ulcers - Continue IV PPI for now, transition to oral tomorrow - GI consult done, awaiting further recommendations - Hold ibuprofen  Substance abuse - Counsel cessation, particularly given above    Tobacco abuse - Counsel cessation    GERD (gastroesophageal reflux disease) with h/o ulcer - On IV PPI and hematemesis has improved - Monitor - Hold Ibuprofen   Hypertension - On clonidine now - Restart home hctz    Asthma - Albuterol PRN  DVT prophylaxis: SCDs given concern for GIB.  If H/H stable in am, start lovenox  Code Status: Full.  Family Communication:  plan of care discussed with the patient Disposition Plan: Home when stable.   IV access:  Peripheral IV  Procedures and diagnostic studies:    Dg Chest 1 View  09/05/2015  CLINICAL DATA:  40 year old female with seizures and stroke-like symptoms. EXAM: CHEST 1 VIEW COMPARISON:  None. FINDINGS: The heart size and mediastinal contours are within normal limits. Both lungs are clear. The visualized skeletal structures are unremarkable. IMPRESSION: No active disease. Electronically Signed   By: Anner Crete M.D.   On: 09/05/2015 22:56   Ct Head Wo Contrast  09/05/2015  CLINICAL DATA:  Acute onset of recurrent seizures. Initial encounter. EXAM: CT HEAD WITHOUT CONTRAST TECHNIQUE: Contiguous axial images were obtained from the base of the skull through the vertex without intravenous contrast. COMPARISON:  CT of the head and MRI of the brain performed 05/23/2015 FINDINGS:  There is no evidence of acute infarction, mass lesion, or intra- or extra-axial hemorrhage on CT. There has been interval evolution of the previously noted acute infarcts at the high parietal lobes bilaterally, more posterior on the right. The brainstem and fourth ventricle are within normal limits. The basal ganglia are unremarkable in appearance. No mass effect or midline shift is seen. There is no evidence of fracture;  visualized osseous structures are unremarkable in appearance. The orbits are within normal limits. The paranasal sinuses and mastoid air cells are well-aerated. No significant soft tissue abnormalities are seen. IMPRESSION: 1. No acute intracranial pathology seen on CT. 2. Interval evolution of previously noted infarcts at the high parietal lobes bilaterally, more posterior on the right. No new infarct seen on CT. Electronically Signed   By: Garald Balding M.D.   On: 09/05/2015 23:42   Mr Brain Wo Contrast  09/07/2015  CLINICAL DATA:  40 year old hypertensive female with hyperlipidemia and smoking history. History of past substance abuse (cocaine). Presenting with nausea and vomiting and new onset seizures. Subsequent encounter. EXAM: MRI HEAD WITHOUT CONTRAST TECHNIQUE: Multiplanar, multiecho pulse sequences of the brain and surrounding structures were obtained without intravenous contrast. COMPARISON:  09/05/2015 head CT. 05/23/2015 brain MR and CT angiogram head and neck. FINDINGS: Significant change since the prior MR. Previously, the patient had an appearance that was most suggestive of acute/ subacute nonhemorrhagic infarction of the posterior superior right frontal lobe and the mid superior left frontal lobe. Now noted in these regions is significant T2 altered signal intensity of the subcortical white matter sparing overlying cortex with local mass effect. This may reflect result of underlying vasculitis given patient's history. Along the superior margin of the right posterior frontal abnormality, there is either small amount of blood or a thrombosed vein (series 10 image is 79 through 84). The adjacent major dural sinuses are patent. The patient would benefit from contrast-enhanced imaging to exclude underlying enhancing lesion. No contiguous T2 altered signal intensity extending across the corpus callosum however this does not exclude multi focal glioma or lymphoma. Possibly of metastatic disease can be  addressed with contrast-enhanced imaging. Infection (possibly atypical infection) could not be excluded in the proper clinical setting (progressive multifocal leukoencephalopathy would be unusual given the bilaterality). Demyelinating process such as multiple sclerosis also a less likely consideration. No evidence of mesial temporal sclerosis. Partially empty sella without secondary findings of pseudotumor. Slightly heterogeneous bone marrow unchanged and may be related to patient's habitus or result of underlying anemia. Slight prominence soft tissue posterior superior nasopharynx stable without secondary findings of eustachian tube obstruction. Major intracranial arteries are patent. IMPRESSION: Significant change since the prior MR. Previously, the patient had an appearance that was most suggestive of acute/ subacute nonhemorrhagic infarction of the posterior superior right frontal lobe and the mid superior left frontal lobe. Now noted in these regions is significant T2 altered signal intensity of the subcortical white matter sparing overlying cortex with local mass effect. This may reflect result of underlying vasculitis given patient's history. Along the superior margin of the right posterior frontal abnormality, there is either small amount of blood or a thrombosed vein. The adjacent major dural sinuses are patent. The patient would benefit from contrast-enhanced imaging to exclude underlying enhancing lesion. No contiguous T2 altered signal intensity extending across the corpus callosum however this does not exclude multi focal glioma or lymphoma. Possibly of metastatic disease can be addressed with contrast-enhanced imaging. Infection (possibly atypical infection) could not be excluded in the proper  clinical setting Demyelinating process such as multiple sclerosis also a less likely consideration. Slightly heterogeneous bone marrow unchanged and may be related to patient's habitus or result of underlying  anemia. These results were called by telephone at the time of interpretation on 09/07/2015 at 11:27 am to Dr. Leonel Ramsay, who verbally acknowledged these results. Electronically Signed   By: Genia Del M.D.   On: 09/07/2015 11:35   Mr Jeri Cos Contrast  09/07/2015  CLINICAL DATA:  40 year old female with new onset seizures. Progressive signal abnormality, now with associated mass effect, at the superior frontal gyri since October. Polysubstance abuse. Initial encounter. EXAM: MRI HEAD WITH CONTRAST TECHNIQUE: Multiplanar, multiecho pulse sequences of the brain and surrounding structures were obtained with intravenous contrast. COMPARISON:  Noncontrast head CT 0912 hours today, and earlier. Including CTA head and neck 05/23/2015. CONTRAST:  37m MULTIHANCE GADOBENATE DIMEGLUMINE 529 MG/ML IV SOLN FINDINGS: There is mild indistinct postcontrast enhancement in the areas of progressive superior frontal gyrus (mostly white matter) signal abnormality. The enhancement on the right side appears to parallel a small enhancing vessel. On both sides the patchy enhancement is mostly cortically based. The superior sagittal sinus and other major venous structures appear normally enhancing. No associated dural thickening or abnormal dural enhancement. IMPRESSION: Mild indistinct mostly cortically based enhancement in both abnormal superior frontal gyri. The major intracranial venous structures appear normally patent across this series of exams. Differential diagnosis remains broad, but my top differential considerations at this point are Posterior Reversible Encephalopathy Syndrome and Vasculitis. Recommend MRI follow-up in 6-8 weeks to re-evaluate unless clinically indicated sooner. Electronically Signed   By: HGenevie AnnM.D.   On: 09/07/2015 14:37   Dg Chest Port 1 View  09/07/2015  CLINICAL DATA:  Pneumonia.  Seizure. EXAM: PORTABLE CHEST 1 VIEW COMPARISON:  09/05/2015 FINDINGS: The heart size and mediastinal contours are  within normal limits. Both lungs are clear. The visualized skeletal structures are unremarkable. IMPRESSION: No active disease. Electronically Signed   By: WVan ClinesM.D.   On: 09/07/2015 08:05    Medical Consultants:  GI Neurology  Other Consultants:    IAnti-Infectives:     MGilles Chiquito MD  TS[facility] Geropsych UnitPager [phone] If 7PM-7AM, please contact night-coverage [email or web address] Password [email or web address] 09/07/2015, 3:28 PM   LOS: 1 day   HPI/Subjective: No events overnight.  She reports no further vomiting.  She notes that she "does not take a lot of narcotics."  She does report drinking daily.    Objective: Filed Vitals:   09/07/15 0400 09/07/15 0800 09/07/15 1000 09/07/15 1200  BP: 90/51 95/60  113/78  Pulse: 79 73  55  Temp: 98 F (36.7 C)  98.9 F (37.2 C) 98.3 F (36.8 C)  TempSrc: Oral  Oral Oral  Resp: '19 13  16  ' Height:      Weight:      SpO2: 98% 99%  98%    Intake/Output Summary (Last 24 hours) at 09/07/15 1528 Last data filed at 09/07/15 1300  Gross per 24 hour  Intake 1801.25 ml  Output    425 ml  Net 1376.25 ml    Exam:   General:  Pt is alert, follows commands appropriately, not in acute distress  Cardiovascular: Regular rate and rhythm, S1/S2, no murmurs  Respiratory: Clear to auscultation bilaterally, no wheezing, no crackles, no rhonchi  Abdomen: Soft, non tender, non distended, bowel sounds present  Extremities: No edema, no pain to limbs  Neuro: Grossly nonfocal  Data Reviewed: Basic Metabolic  Panel:  Recent Labs Lab 09/05/15 2200 09/06/15 0100 09/06/15 0408  NA 140  --  145  K 2.7*  --  3.3*  CL 98*  --  102  CO2 13*  --  28  GLUCOSE 234*  --  148*  BUN 12  --  11  CREATININE 1.22* 1.13* 0.67  CALCIUM 9.9  --  9.0  MG  --  2.2  --   PHOS  --  5.6*  --    Liver Function Tests:  Recent Labs Lab 09/05/15 2200 09/06/15 0408  AST <5* 14*  ALT <5* 9*  ALKPHOS 80 66  BILITOT 0.7 0.7  PROT 7.9 7.1  ALBUMIN 4.4 3.7       Recent Labs Lab 09/06/15 1037  AMMONIA 35   CBC:  Recent Labs Lab 09/05/15 2200 09/06/15 0100 09/06/15 0408 09/06/15 1037 09/07/15 0303 09/07/15 0827  WBC 12.1* 12.3* 13.0* 17.0* 8.7 8.2  NEUTROABS 9.2*  --  11.1* 14.5* 5.7  --   HGB 14.8 14.9 13.3 13.1 11.3* 11.3*  HCT 45.2 44.8 39.0 38.2 34.0* 33.3*  MCV 93.8 92.4 90.3 89.9 90.7 91.5  PLT 261 274 233 230 146* 177     Recent Results (from the past 240 hour(s))  MRSA PCR Screening     Status: None   Collection Time: 09/06/15  7:42 PM  Result Value Ref Range Status   MRSA by PCR NEGATIVE NEGATIVE Final    Comment:        The GeneXpert MRSA Assay (FDA approved for NASAL specimens only), is one component of a comprehensive MRSA colonization surveillance program. It is not intended to diagnose MRSA infection nor to guide or monitor treatment for MRSA infections.      Scheduled Meds: . ampicillin-sulbactam (UNASYN) IV  3 g Intravenous Q6H  . cloNIDine  0.2 mg Oral TID  . levETIRAcetam  500 mg Intravenous Q12H  . pantoprazole (PROTONIX) IV  40 mg Intravenous Q12H  . sodium chloride  3 mL Intravenous Q12H   Continuous Infusions: . 0.9 % NaCl with KCl 20 mEq / L 75 mL/hr at 09/06/15 2045

## 2015-09-07 NOTE — Progress Notes (Signed)
NEURO HOSPITALIST PROGRESS NOTE   SUBJECTIVE:                                                                                                                        Resting in bed comfortably. Offers no neurological complains. MRI brain with and without contrast reveals no acute stroke but mild indistinct mostly cortically based enhancement in both abnormal superior frontal gyri. The major intracranial venous structures appear normally patent. EEG without frank electrographic seizures, unusual bursts of bifrontally predominant delta activity associated with increased poorly localized fast activity that appears most consistent with encephalopathy. On IV keppra 500 mg BID. OBJECTIVE:                                                                                                                           Vital signs in last 24 hours: Temp:  [98 F (36.7 C)-98.9 F (37.2 C)] 98.3 F (36.8 C) (01/16 1200) Pulse Rate:  [55-101] 55 (01/16 1200) Resp:  [13-25] 16 (01/16 1200) BP: (90-175)/(51-117) 113/78 mmHg (01/16 1200) SpO2:  [96 %-100 %] 98 % (01/16 1200) Weight:  [69.3 kg (152 lb 12.5 oz)] 69.3 kg (152 lb 12.5 oz) (01/15 1932)  Intake/Output from previous day: 01/15 0701 - 01/16 0700 In: 1351.3 [I.V.:851.3; IV Piggyback:500] Out: 425 [Urine:425] Intake/Output this shift: Total I/O In: 450 [I.V.:450] Out: -  Nutritional status: Diet full liquid Room service appropriate?: Yes; Fluid consistency:: Thin  Past Medical History  Diagnosis Date  . Hypertension   . Asthma   . Stroke [facility])    Physical exam:  Constitutional: well developed, pleasant female in no apparent distress. Eyes: no jaundice or exophthalmos.  Head: normocephalic. Neck: supple, no bruits, no JVD. Cardiac: no murmurs. Lungs: clear. Abdomen: soft, no tender, no mass. Extremities: no edema, clubbing, or cyanosis.  Skin: no rash  Neurologic Exam:  General: NAD Mental  Status: Alert, oriented, thought content appropriate.  Speech fluent without evidence of aphasia.  Able to follow 3 step commands without difficulty. Cranial Nerves: II: Discs flat bilaterally; Visual fields grossly normal, pupils equal, round, reactive to light and accommodation III,IV, VI: ptosis not present, extra-ocular motions intact bilaterally V,VII: smile symmetric, facial light touch sensation normal bilaterally VIII: hearing normal  bilaterally IX,X: uvula rises symmetrically XI: bilateral shoulder shrug XII: midline tongue extension without atrophy or fasciculations  Motor: Right : Upper extremity   5/5    Left:     Upper extremity   5/5  Lower extremity   5/5     Lower extremity   5/5 Tone and bulk:normal tone throughout; no atrophy noted Sensory: Pinprick and light touch intact throughout, bilaterally Deep Tendon Reflexes:  Right: Upper Extremity   Left: Upper extremity   biceps (C-5 to C-6) 2/4   biceps (C-5 to C-6) 2/4 tricep (C7) 2/4    triceps (C7) 2/4 Brachioradialis (C6) 2/4  Brachioradialis (C6) 2/4  Lower Extremity Lower Extremity  quadriceps (L-2 to L-4) 2/4   quadriceps (L-2 to L-4) 2/4 Achilles (S1) 2/4   Achilles (S1) 2/4  Plantars: Right: downgoing   Left: downgoing Cerebellar: normal finger-to-nose,  normal heel-to-shin test Gait:  No tested due to multiple leads    Lab Results: Lab Results  Component Value Date/Time   CHOL 151 05/24/2015 05:50 AM   Lipid Panel No results for input(s): CHOL, TRIG, HDL, CHOLHDL, VLDL, LDLCALC in the last 72 hours.  Studies/Results: Dg Chest 1 View  09/05/2015  CLINICAL DATA:  40 year old female with seizures and stroke-like symptoms. EXAM: CHEST 1 VIEW COMPARISON:  None. FINDINGS: The heart size and mediastinal contours are within normal limits. Both lungs are clear. The visualized skeletal structures are unremarkable. IMPRESSION: No active disease. Electronically Signed   By: Elgie CollardArash  Radparvar M.D.   On:  09/05/2015 22:56   Ct Head Wo Contrast  09/05/2015  CLINICAL DATA:  Acute onset of recurrent seizures. Initial encounter. EXAM: CT HEAD WITHOUT CONTRAST TECHNIQUE: Contiguous axial images were obtained from the base of the skull through the vertex without intravenous contrast. COMPARISON:  CT of the head and MRI of the brain performed 05/23/2015 FINDINGS: There is no evidence of acute infarction, mass lesion, or intra- or extra-axial hemorrhage on CT. There has been interval evolution of the previously noted acute infarcts at the high parietal lobes bilaterally, more posterior on the right. The brainstem and fourth ventricle are within normal limits. The basal ganglia are unremarkable in appearance. No mass effect or midline shift is seen. There is no evidence of fracture; visualized osseous structures are unremarkable in appearance. The orbits are within normal limits. The paranasal sinuses and mastoid air cells are well-aerated. No significant soft tissue abnormalities are seen. IMPRESSION: 1. No acute intracranial pathology seen on CT. 2. Interval evolution of previously noted infarcts at the high parietal lobes bilaterally, more posterior on the right. No new infarct seen on CT. Electronically Signed   By: Roanna RaiderJeffery  Chang M.D.   On: 09/05/2015 23:42   Mr Brain Wo Contrast  09/07/2015  CLINICAL DATA:  40 year old hypertensive female with hyperlipidemia and smoking history. History of past substance abuse (cocaine). Presenting with nausea and vomiting and new onset seizures. Subsequent encounter. EXAM: MRI HEAD WITHOUT CONTRAST TECHNIQUE: Multiplanar, multiecho pulse sequences of the brain and surrounding structures were obtained without intravenous contrast. COMPARISON:  09/05/2015 head CT. 05/23/2015 brain MR and CT angiogram head and neck. FINDINGS: Significant change since the prior MR. Previously, the patient had an appearance that was most suggestive of acute/ subacute nonhemorrhagic infarction of the  posterior superior right frontal lobe and the mid superior left frontal lobe. Now noted in these regions is significant T2 altered signal intensity of the subcortical white matter sparing overlying cortex with local mass effect. This may reflect result  of underlying vasculitis given patient's history. Along the superior margin of the right posterior frontal abnormality, there is either small amount of blood or a thrombosed vein (series 10 image is 79 through 84). The adjacent major dural sinuses are patent. The patient would benefit from contrast-enhanced imaging to exclude underlying enhancing lesion. No contiguous T2 altered signal intensity extending across the corpus callosum however this does not exclude multi focal glioma or lymphoma. Possibly of metastatic disease can be addressed with contrast-enhanced imaging. Infection (possibly atypical infection) could not be excluded in the proper clinical setting (progressive multifocal leukoencephalopathy would be unusual given the bilaterality). Demyelinating process such as multiple sclerosis also a less likely consideration. No evidence of mesial temporal sclerosis. Partially empty sella without secondary findings of pseudotumor. Slightly heterogeneous bone marrow unchanged and may be related to patient's habitus or result of underlying anemia. Slight prominence soft tissue posterior superior nasopharynx stable without secondary findings of eustachian tube obstruction. Major intracranial arteries are patent. IMPRESSION: Significant change since the prior MR. Previously, the patient had an appearance that was most suggestive of acute/ subacute nonhemorrhagic infarction of the posterior superior right frontal lobe and the mid superior left frontal lobe. Now noted in these regions is significant T2 altered signal intensity of the subcortical white matter sparing overlying cortex with local mass effect. This may reflect result of underlying vasculitis given patient's  history. Along the superior margin of the right posterior frontal abnormality, there is either small amount of blood or a thrombosed vein. The adjacent major dural sinuses are patent. The patient would benefit from contrast-enhanced imaging to exclude underlying enhancing lesion. No contiguous T2 altered signal intensity extending across the corpus callosum however this does not exclude multi focal glioma or lymphoma. Possibly of metastatic disease can be addressed with contrast-enhanced imaging. Infection (possibly atypical infection) could not be excluded in the proper clinical setting Demyelinating process such as multiple sclerosis also a less likely consideration. Slightly heterogeneous bone marrow unchanged and may be related to patient's habitus or result of underlying anemia. These results were called by telephone at the time of interpretation on 09/07/2015 at 11:27 am to Dr. Amada Jupiter, who verbally acknowledged these results. Electronically Signed   By: Lacy Duverney M.D.   On: 09/07/2015 11:35   Mr Laqueta Jean Contrast  09/07/2015  CLINICAL DATA:  40 year old female with new onset seizures. Progressive signal abnormality, now with associated mass effect, at the superior frontal gyri since October. Polysubstance abuse. Initial encounter. EXAM: MRI HEAD WITH CONTRAST TECHNIQUE: Multiplanar, multiecho pulse sequences of the brain and surrounding structures were obtained with intravenous contrast. COMPARISON:  Noncontrast head CT 0912 hours today, and earlier. Including CTA head and neck 05/23/2015. CONTRAST:  15mL MULTIHANCE GADOBENATE DIMEGLUMINE 529 MG/ML IV SOLN FINDINGS: There is mild indistinct postcontrast enhancement in the areas of progressive superior frontal gyrus (mostly white matter) signal abnormality. The enhancement on the right side appears to parallel a small enhancing vessel. On both sides the patchy enhancement is mostly cortically based. The superior sagittal sinus and other major venous  structures appear normally enhancing. No associated dural thickening or abnormal dural enhancement. IMPRESSION: Mild indistinct mostly cortically based enhancement in both abnormal superior frontal gyri. The major intracranial venous structures appear normally patent across this series of exams. Differential diagnosis remains broad, but my top differential considerations at this point are Posterior Reversible Encephalopathy Syndrome and Vasculitis. Recommend MRI follow-up in 6-8 weeks to re-evaluate unless clinically indicated sooner. Electronically Signed   By: Rexene Edison  Margo Aye M.D.   On: 09/07/2015 14:37   Dg Chest Port 1 View  09/07/2015  CLINICAL DATA:  Pneumonia.  Seizure. EXAM: PORTABLE CHEST 1 VIEW COMPARISON:  09/05/2015 FINDINGS: The heart size and mediastinal contours are within normal limits. Both lungs are clear. The visualized skeletal structures are unremarkable. IMPRESSION: No active disease. Electronically Signed   By: Gaylyn Rong M.D.   On: 09/07/2015 08:05    MEDICATIONS                                                                                                                        Scheduled: . ampicillin-sulbactam (UNASYN) IV  3 g Intravenous Q6H  . cloNIDine  0.2 mg Oral TID  . levETIRAcetam  500 mg Intravenous Q12H  . pantoprazole (PROTONIX) IV  40 mg Intravenous Q12H  . sodium chloride  3 mL Intravenous Q12H    ASSESSMENT/PLAN:                                                                                                            40 yo F with h/o stroke and new onset seizures. She has been started on keppra without further seizures or noticeable side effects.  MRI brain with and without contrast reveals no acute stroke but mild indistinct mostly cortically based enhancement in both abnormal superior frontal gyri, PRES vs vasclitis. EEG without frank electrographic seizures, unusual bursts of bifrontally predominant delta activity associated with increased poorly  localized fast activity that appears most consistent with encephalopathy or may reflect underlying bifrontal abnormality seen on MRI. Doing very well at this moment, thus will maintain on keppra 500 mg BID. Will continue to follow.  Wyatt Portela, MD [facility] [phone]  09/07/2015, 4:08 PM

## 2015-09-07 NOTE — Progress Notes (Signed)
Utilization review completed. Riku Buttery, RN, BSN. 

## 2015-09-07 NOTE — Progress Notes (Signed)
UNASSSIGNED PATIENT Subjective: Patient complains of epigastric pain with reflux. Wants higher dose PPI's. Had an episode of emesis this morning-bilious with ne hematemesis or CGE. Objective: Vital signs in last 24 hours: Temp:  [98 F (36.7 C)-98.9 F (37.2 C)] 98.3 F (36.8 C) (01/16 1200) Pulse Rate:  [55-101] 55 (01/16 1200) Resp:  [13-22] 16 (01/16 1200) BP: (90-175)/(51-117) 113/78 mmHg (01/16 1200) SpO2:  [96 %-100 %] 98 % (01/16 1200) Weight:  [69.3 kg (152 lb 12.5 oz)] 69.3 kg (152 lb 12.5 oz) (01/15 1932)    Intake/Output from previous day: 01/15 0701 - 01/16 0700 In: 1351.3 [I.V.:851.3; IV Piggyback:500] Out: 425 [Urine:425] Intake/Output this shift: Total I/O In: 450 [I.V.:450] Out: -   General appearance: alert, cooperative, appears stated age and distracted Resp: clear to auscultation bilaterally Cardio: regular rate and rhythm, S1, S2 normal, no murmur, click, rub or gallop GI: soft, non-tender; bowel sounds normal; no masses,  no organomegaly  Lab Results:  Recent Labs  09/06/15 1037 09/07/15 0303 09/07/15 0827  WBC 17.0* 8.7 8.2  HGB 13.1 11.3* 11.3*  HCT 38.2 34.0* 33.3*  PLT 230 146* 177   BMET  Recent Labs  09/05/15 2200 09/06/15 0100 09/06/15 0408  NA 140  --  145  K 2.7*  --  3.3*  CL 98*  --  102  CO2 13*  --  28  GLUCOSE 234*  --  148*  BUN 12  --  11  CREATININE 1.22* 1.13* 0.67  CALCIUM 9.9  --  9.0   LFT  Recent Labs  09/06/15 0408  PROT 7.1  ALBUMIN 3.7  AST 14*  ALT 9*  ALKPHOS 66  BILITOT 0.7   PStudies/Results: Dg Chest 1 View  09/05/2015  CLINICAL DATA:  40 year old female with seizures and stroke-like symptoms. EXAM: CHEST 1 VIEW COMPARISON:  None. FINDINGS: The heart size and mediastinal contours are within normal limits. Both lungs are clear. The visualized skeletal structures are unremarkable. IMPRESSION: No active disease. Electronically Signed   By: Elgie CollardArash  Radparvar M.D.   On: 09/05/2015 22:56   Ct Head Wo  Contrast  09/05/2015  CLINICAL DATA:  Acute onset of recurrent seizures. Initial encounter. EXAM: CT HEAD WITHOUT CONTRAST TECHNIQUE: Contiguous axial images were obtained from the base of the skull through the vertex without intravenous contrast. COMPARISON:  CT of the head and MRI of the brain performed 05/23/2015 FINDINGS: There is no evidence of acute infarction, mass lesion, or intra- or extra-axial hemorrhage on CT. There has been interval evolution of the previously noted acute infarcts at the high parietal lobes bilaterally, more posterior on the right. The brainstem and fourth ventricle are within normal limits. The basal ganglia are unremarkable in appearance. No mass effect or midline shift is seen. There is no evidence of fracture; visualized osseous structures are unremarkable in appearance. The orbits are within normal limits. The paranasal sinuses and mastoid air cells are well-aerated. No significant soft tissue abnormalities are seen. IMPRESSION: 1. No acute intracranial pathology seen on CT. 2. Interval evolution of previously noted infarcts at the high parietal lobes bilaterally, more posterior on the right. No new infarct seen on CT. Electronically Signed   By: Roanna RaiderJeffery  Chang M.D.   On: 09/05/2015 23:42   Mr Brain Wo Contrast  09/07/2015  CLINICAL DATA:  40 year old hypertensive female with hyperlipidemia and smoking history. History of past substance abuse (cocaine). Presenting with nausea and vomiting and new onset seizures. Subsequent encounter. EXAM: MRI HEAD WITHOUT CONTRAST TECHNIQUE:  Multiplanar, multiecho pulse sequences of the brain and surrounding structures were obtained without intravenous contrast. COMPARISON:  09/05/2015 head CT. 05/23/2015 brain MR and CT angiogram head and neck. FINDINGS: Significant change since the prior MR. Previously, the patient had an appearance that was most suggestive of acute/ subacute nonhemorrhagic infarction of the posterior superior right frontal  lobe and the mid superior left frontal lobe. Now noted in these regions is significant T2 altered signal intensity of the subcortical white matter sparing overlying cortex with local mass effect. This may reflect result of underlying vasculitis given patient's history. Along the superior margin of the right posterior frontal abnormality, there is either small amount of blood or a thrombosed vein (series 10 image is 79 through 84). The adjacent major dural sinuses are patent. The patient would benefit from contrast-enhanced imaging to exclude underlying enhancing lesion. No contiguous T2 altered signal intensity extending across the corpus callosum however this does not exclude multi focal glioma or lymphoma. Possibly of metastatic disease can be addressed with contrast-enhanced imaging. Infection (possibly atypical infection) could not be excluded in the proper clinical setting (progressive multifocal leukoencephalopathy would be unusual given the bilaterality). Demyelinating process such as multiple sclerosis also a less likely consideration. No evidence of mesial temporal sclerosis. Partially empty sella without secondary findings of pseudotumor. Slightly heterogeneous bone marrow unchanged and may be related to patient's habitus or result of underlying anemia. Slight prominence soft tissue posterior superior nasopharynx stable without secondary findings of eustachian tube obstruction. Major intracranial arteries are patent. IMPRESSION: Significant change since the prior MR. Previously, the patient had an appearance that was most suggestive of acute/ subacute nonhemorrhagic infarction of the posterior superior right frontal lobe and the mid superior left frontal lobe. Now noted in these regions is significant T2 altered signal intensity of the subcortical white matter sparing overlying cortex with local mass effect. This may reflect result of underlying vasculitis given patient's history. Along the superior margin  of the right posterior frontal abnormality, there is either small amount of blood or a thrombosed vein. The adjacent major dural sinuses are patent. The patient would benefit from contrast-enhanced imaging to exclude underlying enhancing lesion. No contiguous T2 altered signal intensity extending across the corpus callosum however this does not exclude multi focal glioma or lymphoma. Possibly of metastatic disease can be addressed with contrast-enhanced imaging. Infection (possibly atypical infection) could not be excluded in the proper clinical setting Demyelinating process such as multiple sclerosis also a less likely consideration. Slightly heterogeneous bone marrow unchanged and may be related to patient's habitus or result of underlying anemia. These results were called by telephone at the time of interpretation on 09/07/2015 at 11:27 am to Dr. Amada Jupiter, who verbally acknowledged these results. Electronically Signed   By: Lacy Duverney M.D.   On: 09/07/2015 11:35   Mr Laqueta Jean Contrast  09/07/2015  CLINICAL DATA:  40 year old female with new onset seizures. Progressive signal abnormality, now with associated mass effect, at the superior frontal gyri since October. Polysubstance abuse. Initial encounter. EXAM: MRI HEAD WITH CONTRAST TECHNIQUE: Multiplanar, multiecho pulse sequences of the brain and surrounding structures were obtained with intravenous contrast. COMPARISON:  Noncontrast head CT 0912 hours today, and earlier. Including CTA head and neck 05/23/2015. CONTRAST:  15mL MULTIHANCE GADOBENATE DIMEGLUMINE 529 MG/ML IV SOLN FINDINGS: There is mild indistinct postcontrast enhancement in the areas of progressive superior frontal gyrus (mostly white matter) signal abnormality. The enhancement on the right side appears to parallel a small enhancing vessel. On both  sides the patchy enhancement is mostly cortically based. The superior sagittal sinus and other major venous structures appear normally  enhancing. No associated dural thickening or abnormal dural enhancement. IMPRESSION: Mild indistinct mostly cortically based enhancement in both abnormal superior frontal gyri. The major intracranial venous structures appear normally patent across this series of exams. Differential diagnosis remains broad, but my top differential considerations at this point are Posterior Reversible Encephalopathy Syndrome and Vasculitis. Recommend MRI follow-up in 6-8 weeks to re-evaluate unless clinically indicated sooner. Electronically Signed   By: Odessa Fleming M.D.   On: 09/07/2015 14:37   Dg Chest Port 1 View  09/07/2015  CLINICAL DATA:  Pneumonia.  Seizure. EXAM: PORTABLE CHEST 1 VIEW COMPARISON:  09/05/2015 FINDINGS: The heart size and mediastinal contours are within normal limits. Both lungs are clear. The visualized skeletal structures are unremarkable. IMPRESSION: No active disease. Electronically Signed   By: Gaylyn Rong M.D.   On: 09/07/2015 08:05   Medications: I have reviewed the patient's current medications.  Assessment/Plan: Epigastric pain with reflux, nausea with one episode of vomiting-no CGE: on BID Protonix. Needs to avoid NSAIDS.   LOS: 1 day   Amor Hyle 09/07/2015, 4:50 PM

## 2015-09-07 NOTE — Procedures (Signed)
History: 40 year old female admitted with seizure  Sedation: None  Technique: This is a 21 channel routine scalp EEG performed at the bedside with bipolar and monopolar montages arranged in accordance to the international 10/20 system of electrode placement. One channel was dedicated to EKG recording.    Background: The background consists of intermixed alpha and beta activities. In addition, there are frequent bursts of bifrontally predominant intermittent rhythmic delta(2-3Hz ) activity. This is  associated with  a prominent increase in beta activity without clear localization. These start and stop without clear evolution lasting from 2 - 20 seconds.   Photic stimulation: Physiologic driving is not performed.   EEG Abnormalities: 1) Unusual bursts of bifrontally predominant delta activity associated with increased poorly localized fast activity. There is  abrupt onset and within normal EEG and no clear evolution.  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 This is a non-epileptic finding which can be associated with encephalopathy or drowsiness.   Given the association with fast activity as well, however, I do not think that the possibility that this represents frequent recurrent partial seizures can be excluded entirely.  Danielle SlotMcNeill Mayrani Khamis, MD [facility] [phone]  If 7pm- 7am, please page neurology on call as listed in AMION.

## 2015-09-07 NOTE — Progress Notes (Signed)
Bedside EEG completed, results pending. 

## 2015-09-08 LAB — CBC
HEMATOCRIT: 37.9 % (ref 36.0–46.0)
HEMOGLOBIN: 12.8 g/dL (ref 12.0–15.0)
MCH: 30.5 pg (ref 26.0–34.0)
MCHC: 33.8 g/dL (ref 30.0–36.0)
MCV: 90.2 fL (ref 78.0–100.0)
Platelets: 219 10*3/uL (ref 150–400)
RBC: 4.2 MIL/uL (ref 3.87–5.11)
RDW: 15.2 % (ref 11.5–15.5)
WBC: 11 10*3/uL — ABNORMAL HIGH (ref 4.0–10.5)

## 2015-09-08 LAB — URINALYSIS W MICROSCOPIC (NOT AT ARMC)
BILIRUBIN URINE: NEGATIVE
Glucose, UA: NEGATIVE mg/dL
Ketones, ur: 40 mg/dL — AB
LEUKOCYTES UA: NEGATIVE
Nitrite: NEGATIVE
PH: 7 (ref 5.0–8.0)
Protein, ur: NEGATIVE mg/dL
SPECIFIC GRAVITY, URINE: 1.016 (ref 1.005–1.030)

## 2015-09-08 LAB — BASIC METABOLIC PANEL
Anion gap: 12 (ref 5–15)
BUN: 7 mg/dL (ref 6–20)
CHLORIDE: 102 mmol/L (ref 101–111)
CO2: 21 mmol/L — ABNORMAL LOW (ref 22–32)
Calcium: 9.3 mg/dL (ref 8.9–10.3)
Creatinine, Ser: 0.52 mg/dL (ref 0.44–1.00)
Glucose, Bld: 96 mg/dL (ref 65–99)
POTASSIUM: 3.2 mmol/L — AB (ref 3.5–5.1)
SODIUM: 135 mmol/L (ref 135–145)

## 2015-09-08 LAB — SEDIMENTATION RATE: Sed Rate: 18 mm/hr (ref 0–22)

## 2015-09-08 LAB — C-REACTIVE PROTEIN

## 2015-09-08 MED ORDER — ENOXAPARIN SODIUM 40 MG/0.4ML ~~LOC~~ SOLN
40.0000 mg | SUBCUTANEOUS | Status: DC
  Administered 2015-09-08 – 2015-09-09 (×2): 40 mg via SUBCUTANEOUS
  Filled 2015-09-08 (×2): qty 0.4

## 2015-09-08 MED ORDER — CLONIDINE HCL 0.1 MG PO TABS
0.1000 mg | ORAL_TABLET | Freq: Three times a day (TID) | ORAL | Status: DC
  Administered 2015-09-08 (×2): 0.1 mg via ORAL
  Filled 2015-09-08 (×3): qty 1

## 2015-09-08 MED ORDER — POTASSIUM CHLORIDE CRYS ER 20 MEQ PO TBCR
40.0000 meq | EXTENDED_RELEASE_TABLET | Freq: Once | ORAL | Status: AC
  Administered 2015-09-08: 40 meq via ORAL
  Filled 2015-09-08: qty 2

## 2015-09-08 MED ORDER — LEVETIRACETAM 500 MG PO TABS
500.0000 mg | ORAL_TABLET | Freq: Two times a day (BID) | ORAL | Status: DC
  Administered 2015-09-08 – 2015-09-09 (×2): 500 mg via ORAL
  Filled 2015-09-08 (×2): qty 1

## 2015-09-08 MED ORDER — FAMOTIDINE 20 MG PO TABS
20.0000 mg | ORAL_TABLET | Freq: Two times a day (BID) | ORAL | Status: DC | PRN
  Administered 2015-09-08: 20 mg via ORAL
  Filled 2015-09-08: qty 1

## 2015-09-08 MED ORDER — HYDRALAZINE HCL 20 MG/ML IJ SOLN
10.0000 mg | INTRAMUSCULAR | Status: DC | PRN
  Administered 2015-09-08 – 2015-09-09 (×2): 10 mg via INTRAVENOUS
  Filled 2015-09-08 (×2): qty 1

## 2015-09-08 MED ORDER — PANTOPRAZOLE SODIUM 40 MG PO TBEC
40.0000 mg | DELAYED_RELEASE_TABLET | Freq: Two times a day (BID) | ORAL | Status: DC
  Administered 2015-09-08 – 2015-09-09 (×2): 40 mg via ORAL
  Filled 2015-09-08 (×2): qty 1

## 2015-09-08 NOTE — Progress Notes (Signed)
[facility] PROGRESS NOTE  Kori Goins GHW:[id number] DOB: [birth year] DOA: 09/05/2015 PCP: No primary care provider on file.- follow at the health dept in [place] narrative:   40yo woman with PMH of HTN, asthma and CVA in 2016 presented for stroke-like symptoms and found to have seizures in the ED and was treated with medications.  She further reported nausea and vomiting earlier in the day.   And also was found to have coffee ground emesis in the hospital (this has resolved).  CBC was trended and she did have a significant drop but this stabilized.   1/16: She has had no further seizures.  She has had no further emesis.  She did not require a blood transfusion and her H/H have stabilized.  There is some concern for opiate withdrawal given her UDS with amphetamines, opiates and THC.  She was treated with for opiate withdrawal and her mental status is much improved.    EEG done today showed possible intermittent partial seizures.    MRI done today shows possible PRES or vasculitis  1/17: much improved, eating and can transfer from SDU  Assessment/Plan:    Seizures, generalized convulsive - EEG: possible intermittent seizures MRI: possible PRES and/or vasculitis - For seizures - no further, she is on IV keppra and doing well.  - For PRES - BP is better controlled on clonidine, continue this for now + for amphetamines and THC in the urine: in case reports amphetamines have been shown to cause PRES.  Counsel on cessation - For vascultis -  ESR, CRP ok, viral hepatitis panel PENDING  Cr normal, CBC diff WNL, AST/ALT low, urine with red blood cells, repeat with sediment - Continue keppra - Neurology following - Unclear if infection played a role, on unasyn - will d/c as there is no evidence of infiltrate on most recent CXR.   Hematemesis - Resolved at this time, CBC stable - Possibly related to retching vs. Ulcers - Continue IV PPI for now -advancing diet - GI  consult done- PPI and avoid NSAIDs - Hold ibuprofen  Substance abuse - Counsel cessation, particularly given above    Tobacco abuse - Counsel cessation    GERD (gastroesophageal reflux disease) with h/o ulcer - On IV PPI and hematemesis has improved - Monitor - Hold Ibuprofen   Hypertension - On clonidine now - Restart home hctz    Asthma - Albuterol PRN  hypokalemia -replace  Code Status: Full.  Family Communication:  plan of care discussed with the patient Disposition Plan: tx to med surge-- home in AM?  IV access:  Peripheral IV  Procedures and diagnostic studies:    Dg Chest 1 View  09/05/2015  CLINICAL DATA:  40 year old female with seizures and stroke-like symptoms. EXAM: CHEST 1 VIEW COMPARISON:  None. FINDINGS: The heart size and mediastinal contours are within normal limits. Both lungs are clear. The visualized skeletal structures are unremarkable. IMPRESSION: No active disease. Electronically Signed   By: Anner Crete M.D.   On: 09/05/2015 22:56   Ct Head Wo Contrast  09/05/2015  CLINICAL DATA:  Acute onset of recurrent seizures. Initial encounter. EXAM: CT HEAD WITHOUT CONTRAST TECHNIQUE: Contiguous axial images were obtained from the base of the skull through the vertex without intravenous contrast. COMPARISON:  CT of the head and MRI of the brain performed 05/23/2015 FINDINGS: There is no evidence of acute infarction, mass lesion, or intra- or extra-axial hemorrhage on CT. There has been interval evolution of the previously  noted acute infarcts at the high parietal lobes bilaterally, more posterior on the right. The brainstem and fourth ventricle are within normal limits. The basal ganglia are unremarkable in appearance. No mass effect or midline shift is seen. There is no evidence of fracture; visualized osseous structures are unremarkable in appearance. The orbits are within normal limits. The paranasal sinuses and mastoid air cells are well-aerated. No  significant soft tissue abnormalities are seen. IMPRESSION: 1. No acute intracranial pathology seen on CT. 2. Interval evolution of previously noted infarcts at the high parietal lobes bilaterally, more posterior on the right. No new infarct seen on CT. Electronically Signed   By: Garald Balding M.D.   On: 09/05/2015 23:42   Mr Brain Wo Contrast  09/08/2015  ADDENDUM REPORT: 09/08/2015 07:52 ADDENDUM: Abnormality may represent changes of posterior reversible encephalopathy syndrome. Electronically Signed   By: Genia Del M.D.   On: 09/08/2015 07:52  09/08/2015  CLINICAL DATA:  40 year old hypertensive female with hyperlipidemia and smoking history. History of past substance abuse (cocaine). Presenting with nausea and vomiting and new onset seizures. Subsequent encounter. EXAM: MRI HEAD WITHOUT CONTRAST TECHNIQUE: Multiplanar, multiecho pulse sequences of the brain and surrounding structures were obtained without intravenous contrast. COMPARISON:  09/05/2015 head CT. 05/23/2015 brain MR and CT angiogram head and neck. FINDINGS: Significant change since the prior MR. Previously, the patient had an appearance that was most suggestive of acute/ subacute nonhemorrhagic infarction of the posterior superior right frontal lobe and the mid superior left frontal lobe. Now noted in these regions is significant T2 altered signal intensity of the subcortical white matter sparing overlying cortex with local mass effect. This may reflect result of underlying vasculitis given patient's history. Along the superior margin of the right posterior frontal abnormality, there is either small amount of blood or a thrombosed vein (series 10 image is 79 through 84). The adjacent major dural sinuses are patent. The patient would benefit from contrast-enhanced imaging to exclude underlying enhancing lesion. No contiguous T2 altered signal intensity extending across the corpus callosum however this does not exclude multi focal glioma or  lymphoma. Possibly of metastatic disease can be addressed with contrast-enhanced imaging. Infection (possibly atypical infection) could not be excluded in the proper clinical setting (progressive multifocal leukoencephalopathy would be unusual given the bilaterality). Demyelinating process such as multiple sclerosis also a less likely consideration. No evidence of mesial temporal sclerosis. Partially empty sella without secondary findings of pseudotumor. Slightly heterogeneous bone marrow unchanged and may be related to patient's habitus or result of underlying anemia. Slight prominence soft tissue posterior superior nasopharynx stable without secondary findings of eustachian tube obstruction. Major intracranial arteries are patent. IMPRESSION: Significant change since the prior MR. Previously, the patient had an appearance that was most suggestive of acute/ subacute nonhemorrhagic infarction of the posterior superior right frontal lobe and the mid superior left frontal lobe. Now noted in these regions is significant T2 altered signal intensity of the subcortical white matter sparing overlying cortex with local mass effect. This may reflect result of underlying vasculitis given patient's history. Along the superior margin of the right posterior frontal abnormality, there is either small amount of blood or a thrombosed vein. The adjacent major dural sinuses are patent. The patient would benefit from contrast-enhanced imaging to exclude underlying enhancing lesion. No contiguous T2 altered signal intensity extending across the corpus callosum however this does not exclude multi focal glioma or lymphoma. Possibly of metastatic disease can be addressed with contrast-enhanced imaging. Infection (possibly atypical infection)  could not be excluded in the proper clinical setting Demyelinating process such as multiple sclerosis also a less likely consideration. Slightly heterogeneous bone marrow unchanged and may be related  to patient's habitus or result of underlying anemia. These results were called by telephone at the time of interpretation on 09/07/2015 at 11:27 am to Dr. Leonel Ramsay, who verbally acknowledged these results. Electronically Signed: By: Genia Del M.D. On: 09/07/2015 11:35   Mr Jeri Cos Contrast  09/07/2015  CLINICAL DATA:  40 year old female with new onset seizures. Progressive signal abnormality, now with associated mass effect, at the superior frontal gyri since October. Polysubstance abuse. Initial encounter. EXAM: MRI HEAD WITH CONTRAST TECHNIQUE: Multiplanar, multiecho pulse sequences of the brain and surrounding structures were obtained with intravenous contrast. COMPARISON:  Noncontrast head CT 0912 hours today, and earlier. Including CTA head and neck 05/23/2015. CONTRAST:  42m MULTIHANCE GADOBENATE DIMEGLUMINE 529 MG/ML IV SOLN FINDINGS: There is mild indistinct postcontrast enhancement in the areas of progressive superior frontal gyrus (mostly white matter) signal abnormality. The enhancement on the right side appears to parallel a small enhancing vessel. On both sides the patchy enhancement is mostly cortically based. The superior sagittal sinus and other major venous structures appear normally enhancing. No associated dural thickening or abnormal dural enhancement. IMPRESSION: Mild indistinct mostly cortically based enhancement in both abnormal superior frontal gyri. The major intracranial venous structures appear normally patent across this series of exams. Differential diagnosis remains broad, but my top differential considerations at this point are Posterior Reversible Encephalopathy Syndrome and Vasculitis. Recommend MRI follow-up in 6-8 weeks to re-evaluate unless clinically indicated sooner. Electronically Signed   By: HGenevie AnnM.D.   On: 09/07/2015 14:37   Dg Chest Port 1 View  09/07/2015  CLINICAL DATA:  Pneumonia.  Seizure. EXAM: PORTABLE CHEST 1 VIEW COMPARISON:  09/05/2015 FINDINGS: The  heart size and mediastinal contours are within normal limits. Both lungs are clear. The visualized skeletal structures are unremarkable. IMPRESSION: No active disease. Electronically Signed   By: WVan ClinesM.D.   On: 09/07/2015 08:05    Medical Consultants:  GI Neurology  Other Consultants:    IAnti-Infectives:    Time: 25 min   HPI/Subjective: Eating better No further bleeding  Objective: Filed Vitals:   09/08/15 0300 09/08/15 0538 09/08/15 0630 09/08/15 0802  BP: 165/95 180/101 100/72 140/69  Pulse: 64 67 80 75  Temp: 98 F (36.7 C)   98.1 F (36.7 C)  TempSrc: Oral   Oral  Resp: _0 Height:      Weight:      SpO2: 100% 100% 99% 100%    Intake/Output Summary (Last 24 hours) at 09/08/15 0921 Last data filed at 09/08/15 0600  Gross per 24 hour  Intake   2395 ml  Output   1300 ml  Net   1095 ml    Exam:   General:  Pt is alert, follows commands appropriately, not in acute distress  Cardiovascular: Regular rate and rhythm, S1/S2, no murmurs  Respiratory: Clear to auscultation bilaterally, no wheezing, no crackles, no rhonchi  Abdomen: Soft, non tender, non distended, bowel sounds present  Extremities: No edema, no pain to limbs  Neuro: Grossly nonfocal  Data Reviewed: Basic Metabolic Panel:  Recent Labs Lab 09/05/15 2200 09/06/15 0100 09/06/15 0408 09/08/15 0347  NA 140  --  145 135  K 2.7*  --  3.3* 3.2*  CL 98*  --  102 102  CO2 13*  --  28 21*  GLUCOSE 234*  --  148* 96  BUN 12  --  11 7  CREATININE 1.22* 1.13* 0.67 0.52  CALCIUM 9.9  --  9.0 9.3  MG  --  2.2  --   --   PHOS  --  5.6*  --   --    Liver Function Tests:  Recent Labs Lab 09/05/15 2200 09/06/15 0408  AST <5* 14*  ALT <5* 9*  ALKPHOS 80 66  BILITOT 0.7 0.7  PROT 7.9 7.1  ALBUMIN 4.4 3.7      Recent Labs Lab 09/06/15 1037  AMMONIA 35   CBC:  Recent Labs Lab 09/05/15 2200  09/06/15 0408 09/06/15 1037 09/07/15 0303 09/07/15 0827  09/07/15 1820 09/08/15 0347  WBC 12.1*  < > 13.0* 17.0* 8.7 8.2 10.8* 11.0*  NEUTROABS 9.2*  --  11.1* 14.5* 5.7  --   --   --   HGB 14.8  < > 13.3 13.1 11.3* 11.3* 11.8* 12.8  HCT 45.2  < > 39.0 38.2 34.0* 33.3* 35.9* 37.9  MCV 93.8  < > 90.3 89.9 90.7 91.5 92.5 90.2  PLT 261  < > 233 230 146* 177 195 219  < > = values in this interval not displayed.   Recent Results (from the past 240 hour(s))  MRSA PCR Screening     Status: None   Collection Time: 09/06/15  7:42 PM  Result Value Ref Range Status   MRSA by PCR NEGATIVE NEGATIVE Final    Comment:        The GeneXpert MRSA Assay (FDA approved for NASAL specimens only), is one component of a comprehensive MRSA colonization surveillance program. It is not intended to diagnose MRSA infection nor to guide or monitor treatment for MRSA infections.      Scheduled Meds: . ampicillin-sulbactam (UNASYN) IV  3 g Intravenous Q6H  . cloNIDine  0.2 mg Oral TID  . hydrochlorothiazide  12.5 mg Oral Daily  . levETIRAcetam  500 mg Intravenous Q12H  . pantoprazole (PROTONIX) IV  40 mg Intravenous Q12H  . potassium chloride  40 mEq Oral Once  . sodium chloride  3 mL Intravenous Q12H   Continuous Infusions:     Firas Guardado U Aerionna Moravek, DO [facility] Pager [phone]  If 7PM-7AM, please contact night-coverage [email or web address] Password [email or web address] 09/08/2015, 9:21 AM   LOS: 2 days

## 2015-09-08 NOTE — Clinical Documentation Improvement (Signed)
Hospitalist  Can the diagnosis of pneumonia be further specified?   Type of Pneumonia - CAP, HCAP, Bacterial Pneumonia, Aspiration Pneumonia, Gram Negative Pneumonia, Other Condition, Unable to Clinically Determine  Document causative organism if known  Document mechanism - Aspiration, Ventilator - Associated, Radiation Induced, Other (Specify)  Document any associated diagnoses/conditions such as Respiratory Failure, Sepsis, Underlying Lung Disease (Specify), Other (Specify)  Other condition  Clinically Undetermined   Supporting Information: :  Aspiration into respiratory tract, Check chest radiograph. Bronchodilators as needed. Unasyn 3 g IVPB every 6 hours per 01/15 progress notes.  Please exercise your independent, professional judgment when responding. A specific answer is not anticipated or expected.   Thank Sabino Donovan [facility] [place] [phone]

## 2015-09-08 NOTE — Progress Notes (Signed)
Patient arrived from 2S. Safety precautions and orders reviewed with patient. Pt denied pain at this time. Will cotninue to monitor.   Sim Boast, RN

## 2015-09-08 NOTE — Progress Notes (Signed)
NEURO HOSPITALIST PROGRESS NOTE   SUBJECTIVE:                                                                                                                        No new neurological developments. York Spaniel that she feels well and wants to go home. On IV keppra 500 mg BID.  OBJECTIVE:                                                                                                                           Vital signs in last 24 hours: Temp:  [98 F (36.7 C)-98.5 F (36.9 C)] 98.2 F (36.8 C) (01/17 1224) Pulse Rate:  [64-102] 102 (01/17 1224) Resp:  [15-19] 17 (01/17 1224) BP: (100-205)/(69-105) 138/84 mmHg (01/17 1224) SpO2:  [99 %-100 %] 99 % (01/17 1224)  Intake/Output from previous day: 01/16 0701 - 01/17 0700 In: 2545 [I.V.:1725; IV Piggyback:820] Out: 1300 [Urine:1100; Emesis/NG output:200] Intake/Output this shift: Total I/O In: 690 [P.O.:360; I.V.:225; IV Piggyback:105] Out: 900 [Urine:900] Nutritional status: Diet regular Room service appropriate?: Yes; Fluid consistency:: Thin  Past Medical History  Diagnosis Date  . Hypertension   . Asthma   . Stroke [facility])    Physical exam:  Constitutional: well developed, pleasant female in no apparent distress. Eyes: no jaundice or exophthalmos.  Head: normocephalic. Neck: supple, no bruits, no JVD. Cardiac: no murmurs. Lungs: clear. Abdomen: soft, no tender, no mass. Extremities: no edema, clubbing, or cyanosis.  Skin: no rash   Neurologic Exam:  General: NAD Mental Status: Alert, oriented, thought content appropriate. Speech fluent without evidence of aphasia. Able to follow 3 step commands without difficulty. Cranial Nerves: II: Discs flat bilaterally; Visual fields grossly normal, pupils equal, round, reactive to light and accommodation III,IV, VI: ptosis not present, extra-ocular motions intact bilaterally V,VII: smile symmetric, facial light touch sensation normal  bilaterally VIII: hearing normal bilaterally IX,X: uvula rises symmetrically XI: bilateral shoulder shrug XII: midline tongue extension without atrophy or fasciculations  Motor: Right :Upper extremity 5/5Left: Upper extremity 5/5 Lower extremity 5/5Lower extremity 5/5 Tone and bulk:normal tone throughout; no atrophy noted Sensory: Pinprick and light touch intact throughout, bilaterally Deep Tendon Reflexes:  Right: Upper Extremity Left: Upper extremity   biceps (C-5 to  C-6) 2/4 biceps (C-5 to C-6) 2/4 tricep (C7) 2/4triceps (C7) 2/4 Brachioradialis (C6) 2/4Brachioradialis (C6) 2/4  Lower Extremity Lower Extremity  quadriceps (L-2 to L-4) 2/4 quadriceps (L-2 to L-4) 2/4 Achilles (S1) 2/4Achilles (S1) 2/4  Plantars: Right: downgoingLeft: downgoing Cerebellar: normal finger-to-nose, normal heel-to-shin test Gait:  No tested due to multiple leads  Lab Results: Lab Results  Component Value Date/Time   CHOL 151 05/24/2015 05:50 AM   Lipid Panel No results for input(s): CHOL, TRIG, HDL, CHOLHDL, VLDL, LDLCALC in the last 72 hours.  Studies/Results: Mr Sherrin Daisy Contrast  09/08/2015  ADDENDUM REPORT: 09/08/2015 07:52 ADDENDUM: Abnormality may represent changes of posterior reversible encephalopathy syndrome. Electronically Signed   By: Lacy Duverney M.D.   On: 09/08/2015 07:52  09/08/2015  CLINICAL DATA:  40 year old hypertensive female with hyperlipidemia and smoking history. History of past substance abuse (cocaine). Presenting with nausea and vomiting and new onset seizures. Subsequent encounter. EXAM: MRI HEAD WITHOUT CONTRAST TECHNIQUE: Multiplanar, multiecho pulse sequences of the  brain and surrounding structures were obtained without intravenous contrast. COMPARISON:  09/05/2015 head CT. 05/23/2015 brain MR and CT angiogram head and neck. FINDINGS: Significant change since the prior MR. Previously, the patient had an appearance that was most suggestive of acute/ subacute nonhemorrhagic infarction of the posterior superior right frontal lobe and the mid superior left frontal lobe. Now noted in these regions is significant T2 altered signal intensity of the subcortical white matter sparing overlying cortex with local mass effect. This may reflect result of underlying vasculitis given patient's history. Along the superior margin of the right posterior frontal abnormality, there is either small amount of blood or a thrombosed vein (series 10 image is 79 through 84). The adjacent major dural sinuses are patent. The patient would benefit from contrast-enhanced imaging to exclude underlying enhancing lesion. No contiguous T2 altered signal intensity extending across the corpus callosum however this does not exclude multi focal glioma or lymphoma. Possibly of metastatic disease can be addressed with contrast-enhanced imaging. Infection (possibly atypical infection) could not be excluded in the proper clinical setting (progressive multifocal leukoencephalopathy would be unusual given the bilaterality). Demyelinating process such as multiple sclerosis also a less likely consideration. No evidence of mesial temporal sclerosis. Partially empty sella without secondary findings of pseudotumor. Slightly heterogeneous bone marrow unchanged and may be related to patient's habitus or result of underlying anemia. Slight prominence soft tissue posterior superior nasopharynx stable without secondary findings of eustachian tube obstruction. Major intracranial arteries are patent. IMPRESSION: Significant change since the prior MR. Previously, the patient had an appearance that was most suggestive of acute/  subacute nonhemorrhagic infarction of the posterior superior right frontal lobe and the mid superior left frontal lobe. Now noted in these regions is significant T2 altered signal intensity of the subcortical white matter sparing overlying cortex with local mass effect. This may reflect result of underlying vasculitis given patient's history. Along the superior margin of the right posterior frontal abnormality, there is either small amount of blood or a thrombosed vein. The adjacent major dural sinuses are patent. The patient would benefit from contrast-enhanced imaging to exclude underlying enhancing lesion. No contiguous T2 altered signal intensity extending across the corpus callosum however this does not exclude multi focal glioma or lymphoma. Possibly of metastatic disease can be addressed with contrast-enhanced imaging. Infection (possibly atypical infection) could not be excluded in the proper clinical setting Demyelinating process such as multiple sclerosis also a less likely consideration. Slightly heterogeneous bone marrow unchanged and may be  related to patient's habitus or result of underlying anemia. These results were called by telephone at the time of interpretation on 09/07/2015 at 11:27 am to Dr. Amada Jupiter, who verbally acknowledged these results. Electronically Signed: By: Lacy Duverney M.D. On: 09/07/2015 11:35   Mr Laqueta Jean Contrast  09/07/2015  CLINICAL DATA:  40 year old female with new onset seizures. Progressive signal abnormality, now with associated mass effect, at the superior frontal gyri since October. Polysubstance abuse. Initial encounter. EXAM: MRI HEAD WITH CONTRAST TECHNIQUE: Multiplanar, multiecho pulse sequences of the brain and surrounding structures were obtained with intravenous contrast. COMPARISON:  Noncontrast head CT 0912 hours today, and earlier. Including CTA head and neck 05/23/2015. CONTRAST:  15mL MULTIHANCE GADOBENATE DIMEGLUMINE 529 MG/ML IV SOLN FINDINGS: There  is mild indistinct postcontrast enhancement in the areas of progressive superior frontal gyrus (mostly white matter) signal abnormality. The enhancement on the right side appears to parallel a small enhancing vessel. On both sides the patchy enhancement is mostly cortically based. The superior sagittal sinus and other major venous structures appear normally enhancing. No associated dural thickening or abnormal dural enhancement. IMPRESSION: Mild indistinct mostly cortically based enhancement in both abnormal superior frontal gyri. The major intracranial venous structures appear normally patent across this series of exams. Differential diagnosis remains broad, but my top differential considerations at this point are Posterior Reversible Encephalopathy Syndrome and Vasculitis. Recommend MRI follow-up in 6-8 weeks to re-evaluate unless clinically indicated sooner. Electronically Signed   By: Odessa Fleming M.D.   On: 09/07/2015 14:37   Dg Chest Port 1 View  09/07/2015  CLINICAL DATA:  Pneumonia.  Seizure. EXAM: PORTABLE CHEST 1 VIEW COMPARISON:  09/05/2015 FINDINGS: The heart size and mediastinal contours are within normal limits. Both lungs are clear. The visualized skeletal structures are unremarkable. IMPRESSION: No active disease. Electronically Signed   By: Gaylyn Rong M.D.   On: 09/07/2015 08:05    MEDICATIONS                                                                                                                        Scheduled: . cloNIDine  0.1 mg Oral TID  . enoxaparin (LOVENOX) injection  40 mg Subcutaneous Q24H  . hydrochlorothiazide  12.5 mg Oral Daily  . levETIRAcetam  500 mg Intravenous Q12H  . pantoprazole  40 mg Oral BID AC  . sodium chloride  3 mL Intravenous Q12H    ASSESSMENT/PLAN:  40 yo F with h/o stroke and new onset seizures. She has been started on keppra without  further seizures or noticeable side effects.  MRI brain with and without contrast reveals no acute stroke but mild indistinct mostly cortically based enhancement in both abnormal superior frontal gyri, PRES vs vasclitis. EEG without frank electrographic seizures, unusual bursts of bifrontally predominant delta activity associated with increased poorly localized fast activity that appears most consistent with encephalopathy or may reflect underlying bifrontal abnormality seen on MRI. Doing very well at this moment, thus will maintain on keppra 500 mg BID. Plan for repeat MRI brain and outpatient neuro follow up in 6 weeks. No need for further neuro intervention at this moment. Will sign off.   Wyatt Portela, MD [facility] [phone]  09/08/2015, 3:36 PM

## 2015-09-09 DIAGNOSIS — K2901 Acute gastritis with bleeding: Secondary | ICD-10-CM

## 2015-09-09 DIAGNOSIS — F1910 Other psychoactive substance abuse, uncomplicated: Secondary | ICD-10-CM

## 2015-09-09 DIAGNOSIS — J4520 Mild intermittent asthma, uncomplicated: Secondary | ICD-10-CM

## 2015-09-09 DIAGNOSIS — Z720 Tobacco use: Secondary | ICD-10-CM

## 2015-09-09 LAB — HEPATITIS C ANTIBODY

## 2015-09-09 LAB — HEPATITIS A ANTIBODY, TOTAL: HEP A TOTAL AB: NEGATIVE

## 2015-09-09 LAB — HEPATITIS B SURFACE ANTIBODY,QUALITATIVE: HEP B S AB: NONREACTIVE

## 2015-09-09 LAB — HEPATITIS B CORE ANTIBODY, TOTAL: HEP B C TOTAL AB: NEGATIVE

## 2015-09-09 MED ORDER — ESOMEPRAZOLE MAGNESIUM 40 MG PO CPDR: 40.0000 mg | DELAYED_RELEASE_CAPSULE | Freq: Two times a day (BID) | ORAL | Status: DC

## 2015-09-09 MED ORDER — LEVETIRACETAM 500 MG PO TABS: 500.0000 mg | ORAL_TABLET | Freq: Two times a day (BID) | ORAL | Status: DC

## 2015-09-09 MED ORDER — FAMOTIDINE 20 MG PO TABS: 20.0000 mg | ORAL_TABLET | Freq: Two times a day (BID) | ORAL | Status: DC | PRN

## 2015-09-09 MED ORDER — CLONIDINE HCL 0.1 MG PO TABS: 0.1000 mg | ORAL_TABLET | Freq: Two times a day (BID) | ORAL | Status: DC

## 2015-09-09 MED ORDER — CLONIDINE HCL 0.1 MG PO TABS
0.1000 mg | ORAL_TABLET | Freq: Two times a day (BID) | ORAL | Status: DC
  Administered 2015-09-09: 0.1 mg via ORAL

## 2015-09-09 MED ORDER — PROMETHAZINE HCL 12.5 MG PO TABS: 12.5000 mg | ORAL_TABLET | Freq: Four times a day (QID) | ORAL | Status: DC | PRN

## 2015-09-09 MED ORDER — HYDROCHLOROTHIAZIDE 25 MG PO TABS: 12.5000 mg | ORAL_TABLET | Freq: Every day | ORAL | Status: DC

## 2015-09-09 NOTE — Discharge Summary (Signed)
Physician Discharge Summary   Patient ID: Danielle Washington MRN: [id number] DOB/AGE: September 09, 1975 40 y.o.  Admit date: 09/05/2015 Discharge date: 09/09/2015  Primary Care Physician:  No primary care provider on file.  Discharge Diagnoses:    Generalized convulsive seizures  Hematemesis  . Tobacco abuse . GERD (gastroesophageal reflux disease) . Hypertension . Asthma . Substance abuse   Consults:  Neurology Gastroenterology  Recommendations for Outpatient Follow-up:  1. Patient was counseled strongly to be compliant with her medications. 2. Outpatient neurology follow-up was scheduled for the patient prior to her discharge. 3. Patient was also scheduled with PCP appointment 4. Patient was commended to stop NSAIDs, she was placed on twice a day PPI   DIET: Regular diet    Allergies:  No Known Allergies   DISCHARGE MEDICATIONS: Discharge Medication List as of 09/09/2015 11:11 AM    START taking these medications   Details  cloNIDine (CATAPRES) 0.1 MG tablet Take 1 tablet (0.1 mg total) by mouth 2 (two) times daily., Starting 09/09/2015, Until Discontinued, Print    famotidine (PEPCID) 20 MG tablet Take 1 tablet (20 mg total) by mouth 2 (two) times daily as needed for heartburn., Starting 09/09/2015, Until Discontinued, Print    levETIRAcetam (KEPPRA) 500 MG tablet Take 1 tablet (500 mg total) by mouth 2 (two) times daily., Starting 09/09/2015, Until Discontinued, Print    promethazine (PHENERGAN) 12.5 MG tablet Take 1 tablet (12.5 mg total) by mouth every 6 (six) hours as needed for nausea or vomiting., Starting 09/09/2015, Until Discontinued, Print      CONTINUE these medications which have CHANGED   Details  esomeprazole (NEXIUM) 40 MG capsule Take 1 capsule (40 mg total) by mouth 2 (two) times daily before a meal., Starting 09/09/2015, Until Discontinued, Print    hydrochlorothiazide (HYDRODIURIL) 25 MG tablet Take 0.5 tablets (12.5 mg total) by mouth daily., Starting  09/09/2015, Until Discontinued, No Print      CONTINUE these medications which have NOT CHANGED   Details  atorvastatin (LIPITOR) 20 MG tablet Take 1 tablet (20 mg total) by mouth daily at 6 PM., Starting 07/27/2015, Until Discontinued, Print    medroxyPROGESTERone (DEPO-PROVERA) 150 MG/ML injection Inject 150 mg into the muscle every 3 (three) months., Until Discontinued, Historical Med      STOP taking these medications     aspirin EC 81 MG EC tablet      ibuprofen (ADVIL,MOTRIN) 400 MG tablet          Brief H and P: For complete details please refer to admission H and P, but in [phone] woman with PMH of HTN, asthma and CVA in 2016 presented for stroke-like symptoms and found to have seizures in the ED and was treated with medications. She further reported nausea and vomiting earlier in the day. And also was found to have coffee ground emesis in the hospital (this has resolved). CBC was trended and she did have a significant drop but this stabilized.   Hospital Course:   Seizures, generalized convulsive  Patient was admitted to stepdown unit. EEG showed possible intermittent seizures. MRI brain showed possible PRES and/or vasculitis. Neurology was consulted. She was placed on IV Keppra which she tolerated well. - For PRES - BP is better controlled on clonidine, continue this for now, taper slowly outpatient, also HCTZ restarted. - urine drug screen showed + for amphetamines and THC in the urine: in case reports amphetamines have been shown to cause PRES. patient was counseled on cessation. - For vascultis - ESR,  CRP ok, hepatitis panel negative.  Cr normal, CBC diff WNL, AST/ALT low - Continue keppra, outpatient neurology follow-up was scheduled  Hematemesis - Resolved at this time, CBC stable, Possibly related to retching vs. PUD, esophagitis, Mallory-Weiss tear - Patient was placed on IV PPI. GI consult was obtained, patient was seen by Dr. Collene Mares, recommended PPI and avoid  NSAIDs. No EGD was performed.  Substance abuse - Patient was strongly counseled to quit NSAIDs, marijuana and amphetamines, opiates   Tobacco abuse - Counsel cessation   GERD (gastroesophageal reflux disease) with h/o ulcer - Continue PPI twice a day  Hypertension - On clonidine now, HCTZ restarted. Clonidine can be tapered outpatient slowly.   Asthma - Albuterol PRN  Day of Discharge BP 170/89 mmHg  Pulse 85  Temp(Src) 98 F (36.7 C) (Oral)  Resp 18  Ht '5\' 1"'  (1.549 m)  Wt 69.3 kg (152 lb 12.5 oz)  BMI 28.88 kg/m2  SpO2 100%  Physical Exam: General: Alert and awake oriented x3 not in any acute distress. HEENT: anicteric sclera, pupils reactive to light and accommodation CVS: S1-S2 clear no murmur rubs or gallops Chest: clear to auscultation bilaterally, no wheezing rales or rhonchi Abdomen: soft nontender, nondistended, normal bowel sounds Extremities: no cyanosis, clubbing or edema noted bilaterally Neuro: Cranial nerves II-XII intact, no focal neurological deficits   The results of significant diagnostics from this hospitalization (including imaging, microbiology, ancillary and laboratory) are listed below for reference.    LAB RESULTS: Basic Metabolic Panel:  Recent Labs Lab 09/06/15 0100 09/06/15 0408 09/08/15 0347  NA  --  145 135  K  --  3.3* 3.2*  CL  --  102 102  CO2  --  28 21*  GLUCOSE  --  148* 96  BUN  --  11 7  CREATININE 1.13* 0.67 0.52  CALCIUM  --  9.0 9.3  MG 2.2  --   --   PHOS 5.6*  --   --    Liver Function Tests:  Recent Labs Lab 09/05/15 2200 09/06/15 0408  AST <5* 14*  ALT <5* 9*  ALKPHOS 80 66  BILITOT 0.7 0.7  PROT 7.9 7.1  ALBUMIN 4.4 3.7   No results for input(s): LIPASE, AMYLASE in the last 168 hours.  Recent Labs Lab 09/06/15 1037  AMMONIA 35   CBC:  Recent Labs Lab 09/07/15 0303  09/07/15 1820 09/08/15 0347  WBC 8.7  < > 10.8* 11.0*  NEUTROABS 5.7  --   --   --   HGB 11.3*  < > 11.8* 12.8   HCT 34.0*  < > 35.9* 37.9  MCV 90.7  < > 92.5 90.2  PLT 146*  < > 195 219  < > = values in this interval not displayed. Cardiac Enzymes: No results for input(s): CKTOTAL, CKMB, CKMBINDEX, TROPONINI in the last 168 hours. BNP: Invalid input(s): POCBNP CBG: No results for input(s): GLUCAP in the last 168 hours.  Significant Diagnostic Studies:  Dg Chest 1 View  09/05/2015  CLINICAL DATA:  40 year old female with seizures and stroke-like symptoms. EXAM: CHEST 1 VIEW COMPARISON:  None. FINDINGS: The heart size and mediastinal contours are within normal limits. Both lungs are clear. The visualized skeletal structures are unremarkable. IMPRESSION: No active disease. Electronically Signed   By: Anner Crete M.D.   On: 09/05/2015 22:56   Ct Head Wo Contrast  09/05/2015  CLINICAL DATA:  Acute onset of recurrent seizures. Initial encounter. EXAM: CT HEAD WITHOUT CONTRAST TECHNIQUE: Contiguous  axial images were obtained from the base of the skull through the vertex without intravenous contrast. COMPARISON:  CT of the head and MRI of the brain performed 05/23/2015 FINDINGS: There is no evidence of acute infarction, mass lesion, or intra- or extra-axial hemorrhage on CT. There has been interval evolution of the previously noted acute infarcts at the high parietal lobes bilaterally, more posterior on the right. The brainstem and fourth ventricle are within normal limits. The basal ganglia are unremarkable in appearance. No mass effect or midline shift is seen. There is no evidence of fracture; visualized osseous structures are unremarkable in appearance. The orbits are within normal limits. The paranasal sinuses and mastoid air cells are well-aerated. No significant soft tissue abnormalities are seen. IMPRESSION: 1. No acute intracranial pathology seen on CT. 2. Interval evolution of previously noted infarcts at the high parietal lobes bilaterally, more posterior on the right. No new infarct seen on CT.  Electronically Signed   By: Garald Balding M.D.   On: 09/05/2015 23:42    2D ECHO:   Disposition and Follow-up: Discharge Instructions    Diet general    Complete by:  As directed      Discharge instructions    Complete by:  As directed      Increase activity slowly    Complete by:  As directed             DISPOSITION: Home DISCHARGE FOLLOW-UP Follow-up Information    Follow up with [facility]. Go on 09/14/2015.   Why:  AT 1:30 pm FOR SEIZURES follow-up (neurology). Please arrive 15 mins earlier for paperwork.    Contact information:   [address] [place] [phone]      Follow up with [facility]. Go on 09/10/2015.   Why:  Receptionist says you need to go to office and fill out request to be accepted as patient.    Contact information:   [address] [place] [address] [phone]      Follow up with [facility].   Why:  Call and Change PCP       Time spent on Discharge: 35 minutes  Signed:   RAI,RIPUDEEP M.D. [facility] 09/09/2015, 1:43 PM Pager: [phone]

## 2015-09-09 NOTE — Evaluation (Signed)
Physical Therapy Evaluation Patient Details Name: Danielle Washington MRN: [id number] DOB: [birth year] Today's Date: 09/09/2015   History of Present Illness  Pt adm for seizure. PMH - HTN, CVA  Clinical Impression  Pt doing well with mobility and no further PT needed.  Ready for dc from PT standpoint.      Follow Up Recommendations No PT follow up    Equipment Recommendations  None recommended by PT    Recommendations for Other Services       Precautions / Restrictions Precautions Precautions: None Restrictions Weight Bearing Restrictions: No      Mobility  Bed Mobility Overal bed mobility: Independent                Transfers Overall transfer level: Independent                  Ambulation/Gait Ambulation/Gait assistance: Independent Ambulation Distance (Feet): 250 Feet Assistive device: None Gait Pattern/deviations: WFL(Within Functional Limits)   Gait velocity interpretation: at or above normal speed for age/gender General Gait Details: Steady gait. Able to perform turns, head turns, and get object off floor without difficulty.  Stairs            Wheelchair Mobility    Modified Rankin (Stroke Patients Only)       Balance Overall balance assessment: No apparent balance deficits (not formally assessed)                                           Pertinent Vitals/Pain Pain Assessment: No/denies pain    Home Living Family/patient expects to be discharged to:: Private residence Living Arrangements: Children Available Help at Discharge: Family;Available PRN/intermittently                  Prior Function Level of Independence: Independent               Hand Dominance   Dominant Hand: Right    Extremity/Trunk Assessment   Upper Extremity Assessment: Overall WFL for tasks assessed           Lower Extremity Assessment: Overall WFL for tasks assessed         Communication   Communication: No  difficulties  Cognition Arousal/Alertness: Awake/alert Behavior During Therapy: WFL for tasks assessed/performed Overall Cognitive Status: Within Functional Limits for tasks assessed                      General Comments      Exercises        Assessment/Plan    PT Assessment Patent does not need any further PT services  PT Diagnosis Generalized weakness   PT Problem List    PT Treatment Interventions     PT Goals (Current goals can be found in the Care Plan section) Acute Rehab PT Goals PT Goal Formulation: All assessment and education complete, DC therapy    Frequency     Barriers to discharge        Co-evaluation               End of Session   Activity Tolerance: Patient tolerated treatment well Patient left: in bed;with call bell/phone within reach Nurse Communication: Mobility status         Time: 0902-0909 PT Time Calculation (min) (ACUTE ONLY): 7 min   Charges:   PT Evaluation $PT Eval Low Complexity: 1 Procedure  PT G Codes:        Coleston Dirosa 10/03/2015, 9:18 AM [facility] PT [phone]

## 2015-09-09 NOTE — Progress Notes (Signed)
Discharge orders received.  Discharge instructions and follow-up appointments reviewed with the patient.  VSS upon discharge.  IV removed and education complete.  Pt refused to wait for a wheelchair.  Pt walked to the main entrance. Sondra Come, RN

## 2015-09-14 ENCOUNTER — Ambulatory Visit: Payer: Medicaid Other | Admitting: Neurology

## 2015-09-16 ENCOUNTER — Inpatient Hospital Stay: Payer: Medicaid Other | Admitting: Family Medicine

## 2015-10-10 ENCOUNTER — Other Ambulatory Visit: Payer: Self-pay | Admitting: Neurology

## 2015-11-18 ENCOUNTER — Other Ambulatory Visit (HOSPITAL_COMMUNITY): Payer: Self-pay | Admitting: Nurse Practitioner

## 2015-11-18 DIAGNOSIS — Z1231 Encounter for screening mammogram for malignant neoplasm of breast: Secondary | ICD-10-CM

## 2015-11-23 ENCOUNTER — Encounter: Payer: Self-pay | Admitting: *Deleted

## 2015-11-23 ENCOUNTER — Ambulatory Visit (INDEPENDENT_AMBULATORY_CARE_PROVIDER_SITE_OTHER): Payer: Medicaid Other | Admitting: Obstetrics & Gynecology

## 2015-11-23 ENCOUNTER — Encounter: Payer: Self-pay | Admitting: Obstetrics & Gynecology

## 2015-11-23 VITALS — BP 120/80 | HR 76 | Ht 62.0 in | Wt 158.0 lb

## 2015-11-23 DIAGNOSIS — Z3009 Encounter for other general counseling and advice on contraception: Secondary | ICD-10-CM

## 2015-11-23 NOTE — Progress Notes (Signed)
Patient ID: Danielle Washington, female   DOB: [birth year], 40 y.o.   MRN: [id number] Preoperative History and Physical  Danielle Washington is a 40 y.o. No obstetric history on file. with No LMP recorded. Patient has had an injection. admitted for a laparoscopic bilateral salpingectomy for terilization.    PMH:    Past Medical History  Diagnosis Date  . Hypertension   . Asthma   . Stroke (HCC)   . Seizures (HCC)     PSH:     Past Surgical History  Procedure Laterality Date  . No past surgeries    . Tee without cardioversion N/A 05/26/2015    Procedure: TRANSESOPHAGEAL ECHOCARDIOGRAM (TEE);  Surgeon: Quintella Reichert, MD;  Location: [facility] ENDOSCOPY;  Service: Cardiovascular;  Laterality: N/A;    POb/GynH:      OB History    No data available      SH:   Social History  Substance Use Topics  . Smoking status: Current Every Day Smoker -- 1.00 packs/day for 20 years  . Smokeless tobacco: None  . Alcohol Use: 1.2 oz/week    1 Glasses of wine, 1 Shots of liquor per week     Comment: occasionally    FH:    Family History  Problem Relation Age of Onset  . Lung cancer Mother   . Heart disease Mother   . COPD Mother   . Heart attack Father   . Hypertension Father   . Stroke Maternal Grandmother   . Diabetes Maternal Grandmother      Allergies: No Known Allergies  Medications:       Current outpatient prescriptions:  .  atorvastatin (LIPITOR) 20 MG tablet, TAKE 1 TABLET ONCE A DAY AT 6 P.M., Disp: 30 tablet, Rfl: 11 .  cloNIDine (CATAPRES) 0.1 MG tablet, Take 1 tablet (0.1 mg total) by mouth 2 (two) times daily., Disp: 60 tablet, Rfl: 11 .  esomeprazole (NEXIUM) 40 MG capsule, Take 1 capsule (40 mg total) by mouth 2 (two) times daily before a meal., Disp: 60 capsule, Rfl: 3 .  famotidine (PEPCID) 20 MG tablet, Take 1 tablet (20 mg total) by mouth 2 (two) times daily as needed for heartburn., Disp: 60 tablet, Rfl: 3 .  levETIRAcetam (KEPPRA) 500 MG tablet, Take 1 tablet (500 mg total) by mouth 2  (two) times daily., Disp: 60 tablet, Rfl: 3 .  promethazine (PHENERGAN) 12.5 MG tablet, Take 1 tablet (12.5 mg total) by mouth every 6 (six) hours as needed for nausea or vomiting., Disp: 30 tablet, Rfl: 0 .  hydrochlorothiazide (HYDRODIURIL) 25 MG tablet, Take 0.5 tablets (12.5 mg total) by mouth daily. (Patient not taking: Reported on 11/23/2015), Disp: , Rfl:   Review of Systems:   Review of Systems  Constitutional: Negative for fever, chills, weight loss, malaise/fatigue and diaphoresis.  HENT: Negative for hearing loss, ear pain, nosebleeds, congestion, sore throat, neck pain, tinnitus and ear discharge.   Eyes: Negative for blurred vision, double vision, photophobia, pain, discharge and redness.  Respiratory: Negative for cough, hemoptysis, sputum production, shortness of breath, wheezing and stridor.   Cardiovascular: Negative for chest pain, palpitations, orthopnea, claudication, leg swelling and PND.  Gastrointestinal: Positive for abdominal pain. Negative for heartburn, nausea, vomiting, diarrhea, constipation, blood in stool and melena.  Genitourinary: Negative for dysuria, urgency, frequency, hematuria and flank pain.  Musculoskeletal: Negative for myalgias, back pain, joint pain and falls.  Skin: Negative for itching and rash.  Neurological: Negative for dizziness, tingling, tremors, sensory change, speech change,  focal weakness, seizures, loss of consciousness, weakness and headaches.  Endo/Heme/Allergies: Negative for environmental allergies and polydipsia. Does not bruise/bleed easily.  Psychiatric/Behavioral: Negative for depression, suicidal ideas, hallucinations, memory loss and substance abuse. The patient is not nervous/anxious and does not have insomnia.      PHYSICAL EXAM:  Blood pressure 120/80, pulse 76, height  (1.575 m), weight 158 lb (71.668 kg).    Vitals reviewed. Constitutional: She is oriented to person, place, and time. She appears well-developed and  well-nourished.  HENT:  Head: Normocephalic and atraumatic.  Right Ear: External ear normal.  Left Ear: External ear normal.  Nose: Nose normal.  Mouth/Throat: Oropharynx is clear and moist.  Eyes: Conjunctivae and EOM are normal. Pupils are equal, round, and reactive to light. Right eye exhibits no discharge. Left eye exhibits no discharge. No scleral icterus.  Neck: Normal range of motion. Neck supple. No tracheal deviation present. No thyromegaly present.  Cardiovascular: Normal rate, regular rhythm, normal heart sounds and intact distal pulses.  Exam reveals no gallop and no friction rub.   No murmur heard. Respiratory: Effort normal and breath sounds normal. No respiratory distress. She has no wheezes. She has no rales. She exhibits no tenderness.  GI: Soft. Bowel sounds are normal. She exhibits no distension and no mass. There is tenderness. There is no rebound and no guarding.  Genitourinary:       Vulva is normal without lesions Vagina is pink moist without discharge Cervix normal in appearance and pap is normal Uterus is normal size, contour, position, consistency, mobility, non-tender Adnexa is negative with normal sized ovaries by sonogram  Musculoskeletal: Normal range of motion. She exhibits no edema and no tenderness.  Neurological: She is alert and oriented to person, place, and time. She has normal reflexes. She displays normal reflexes. No cranial nerve deficit. She exhibits normal muscle tone. Coordination normal.  Skin: Skin is warm and dry. No rash noted. No erythema. No pallor.  Psychiatric: She has a normal mood and affect. Her behavior is normal. Judgment and thought content normal.    Labs: No results found for this or any previous visit (from the past 336 hour(s)).  EKG: Orders placed or performed during the hospital encounter of 09/05/15  . EKG 12-Lead  . EKG 12-Lead  . EKG    Imaging Studies: No results found.    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 GI bleed 09/06/2015  . 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 Hypertension 09/05/2015  . Asthma 09/05/2015  . Aspiration into respiratory tract 09/05/2015  . 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 Agitation   . Emesis   . Nausea and vomiting 05/23/2015  . Acute ischemic stroke (HCC) 05/23/2015  . Substance abuse 05/23/2015  . Tobacco abuse 05/23/2015  . GERD (gastroesophageal reflux disease) 05/23/2015  . Stroke (cerebrum) (HCC) 05/23/2015  . Stroke of unknown etiology (HCC)   . Nausea with vomiting     Plan: Multiparous female desires sterilization desires permanent sterilization, pt no longer a candidate for hormonal contraception due to stroke suffered in October 2016  Lazaro Arms 11/23/2015 4:02 PM   Current Outpatient Prescriptions on File Prior to Visit  Medication Sig Dispense Refill  . atorvastatin (LIPITOR) 20 MG tablet TAKE 1 TABLET ONCE A DAY AT 6 P.M. 30 tablet 11  . cloNIDine (CATAPRES) 0.1  MG tablet Take 1 tablet (0.1 mg total) by mouth 2 (two) times daily. 60 tablet 11  . esomeprazole (NEXIUM) 40 MG capsule Take 1 capsule (40 mg total) by mouth 2 (two) times daily before a meal. 60 capsule 3  . famotidine (PEPCID) 20 MG tablet Take 1 tablet (20 mg total) by mouth 2 (two) times daily as needed for heartburn. 60 tablet 3  . levETIRAcetam (KEPPRA) 500 MG tablet Take 1 tablet (500 mg total) by mouth 2 (two) times daily. 60 tablet 3  . promethazine (PHENERGAN) 12.5 MG tablet Take 1 tablet (12.5 mg total) by mouth every 6 (six) hours as needed for nausea or vomiting. 30 tablet 0  . hydrochlorothiazide (HYDRODIURIL) 25 MG tablet Take 0.5 tablets (12.5 mg total) by mouth daily. (Patient not taking: Reported on 11/23/2015)     No current facility-administered  medications on file prior to visit.

## 2015-11-26 ENCOUNTER — Inpatient Hospital Stay (HOSPITAL_COMMUNITY): Admission: RE | Admit: 2015-11-26 | Payer: Medicaid Other | Source: Ambulatory Visit

## 2015-12-02 ENCOUNTER — Encounter: Payer: Self-pay | Admitting: Obstetrics & Gynecology

## 2015-12-18 NOTE — Patient Instructions (Addendum)
Danielle Washington  12/18/2015     @PREFPERIOPPHARMACY @   Your procedure is scheduled on 12/30/2015.  Report to [facility] at 7:00 A.M.  Call this number if you have problems the morning of surgery:  [phone]   Remember:  Do not eat food or drink liquids after midnight.  Take these medicines the morning of surgery with A SIP OF WATER Catpres, Nexium, Pepcid and Keppra   Do not wear jewelry, make-up or nail polish.  Do not wear lotions, powders, or perfumes.  You may wear deodorant.  Do not shave 48 hours prior to surgery.  Men may shave face and neck.  Do not bring valuables to the hospital.  [facility] is not responsible for any belongings or valuables.  Contacts, dentures or bridgework may not be worn into surgery.  Leave your suitcase in the car.  After surgery it may be brought to your room.  For patients admitted to the hospital, discharge time will be determined by your treatment team.  Patients discharged the day of surgery will not be allowed to drive home.   Name and phone number of your driver:   family Special instructions:  n/a  Please read over the following fact sheets that you were given. Care and Recovery After Surgery     Salpingectomy Salpingectomy, also called tubectomy, is the surgical removal of one of the fallopian tubes. The fallopian tubes are tubes that are connected to the uterus. These tubes transport the egg from the ovary to the uterus. A salpingectomy may be done for various reasons, including:   A tubal (ectopic) pregnancy. This is especially true if the tube ruptures.  An infected fallopian tube.  The need to remove the fallopian tube when removing an ovary with a cyst or tumor.  The need to remove the fallopian tube when removing the uterus.  Cancer of the fallopian tube or nearby organs. Removing one fallopian tube does not prevent you from becoming pregnant. It also does not cause problems with your menstrual periods.  LET [facility] PROVID[facility] KNOW ABOUT:  Any allergies you have.  All medicines you are taking, including vitamins, herbs, eye drops, creams, and over-the-counter medicines.  Previous problems you or members of your family have had with the use of anesthetics.  Any blood disorders you have.  Previous surgeries you have had.  Medical conditions you have. RISKS AND COMPLICATIONS  Generally, this is a safe procedure. However, as with any procedure, complications can occur. Possible complications include:  Injury to surrounding organs.  Bleeding.  Infection.  Problems related to anesthesia. BEFORE THE PROCEDURE  Ask your health care provider about changing or stopping your regular medicines. You may need to stop taking certain medicines, such as aspirin or blood thinners, at least 1 week before the surgery.  Do not eat or drink anything for at least 8 hours before the surgery.  If you smoke, do not smoke for at least 2 weeks before the surgery.  Make plans to have someone drive you home after the procedure or after your hospital stay. Also arrange for someone to help you with activities during recovery. PROCEDURE   You will be given medicine to help you relax before the procedure (sedative). You will then be given medicine to make you sleep through the procedure (general anesthetic). These medicines will be given through an IV access tube that is put into one of your veins.  Once you are asleep, your lower abdomen will be  shaved and cleaned. A thin, flexible tube (catheter) will be placed in your bladder.  The surgeon may use a laparoscopic, robotic, or open technique for this surgery:  In the laparoscopic technique, the surgery is done through two small cuts (incisions) in the abdomen. A thin, lighted tube with a tiny camera on the end (laparoscope) is inserted into one of the incisions. The tools needed for the procedure are put through the other incision.  A robotic technique may be chosen  to perform complex surgery in a small space. In the robotic technique, small incisions will be made. A camera and surgical instruments are passed through the incisions. Surgical instruments will be controlled with the help of a robotic arm.  In the open technique, the surgery is done through one large incision in the abdomen.  Using any of these techniques, the surgeon removes the fallopian tube from where it attaches to the uterus. The blood vessels will be clamped and tied.  The surgeon then uses staples or stitches to close the incision or incisions. AFTER THE PROCEDURE   You will be taken to a recovery area where your progress will be monitored for 1-3 hours.  If the laparoscopic technique was used, you may be allowed to go home after several hours. You may have some shoulder pain after the laparoscopic procedure. This is normal and usually goes away in a day or two.  If the open technique was used, you will be admitted to the hospital for a couple of days.  You will be given pain medicine if needed.  The IV access tube and catheter will be removed before you are discharged.   This information is not intended to replace advice given to you by your health care provider. Make sure you discuss any questions you have with your health care provider.   Document Released: 12/25/2008 Document Revised: 08/29/2014 Document Reviewed: 01/30/2013 Elsevier Interactive Patient Education 2016 [employer]. Salpingectomy, Care After Refer to this sheet in the next few weeks. These instructions provide you with information on caring for yourself after your procedure. Your health care provider may also give you more specific instructions. Your treatment has been planned according to current medical practices, but problems sometimes occur. Call your health care provider if you have any problems or questions after your procedure. WHAT TO EXPECT AFTER THE PROCEDURE After your procedure, it is typical to  have the following:  Abdominal pain that can be controlled with pain medicine.  Vaginal spotting.  Tiredness. HOME CARE INSTRUCTIONS  Get plenty of rest and sleep.  Only take over-the-counter or prescription medicines as directed by your health care provider.  Keep incision areas clean and dry. Remove or change any bandages (dressings) only as directed by your health care provider.  You may resume your regular diet. Eat a well-balanced diet.  Drink enough fluids to keep your urine clear or pale yellow.  Limit exercise and activities as directed by your health care provider. Do not lift anything heavier than 5 lb (2.3 kg) until your health care provider approves.  Do not drive until your health care provider approves.  Do not have sexual intercourse until your health care provider says it is okay.  Take your temperature twice a day for the first week. Write those temperatures down.  Follow up with your health care provider as directed. SEEK MEDICAL CARE IF:  You have pain when you urinate.  You see pus coming out of the incision, or the incision is separating.  You have increasing abdominal pain.  You have swelling or redness in the incision area.  You develop a rash.  You feel lightheaded.  You have pain that is not controlled with medicine. SEEK IMMEDIATE MEDICAL CARE IF:  You develop a fever.  You have increasing abdominal pain.  You develop chest or leg pain.  You develop shortness of breath.  You pass out.   This information is not intended to replace advice given to you by your health care provider. Make sure you discuss any questions you have with your health care provider.   Document Released: 11/12/2010 Document Revised: 08/29/2014 Document Reviewed: 01/30/2013 Elsevier Interactive Patient Education [employer]

## 2015-12-21 ENCOUNTER — Encounter (HOSPITAL_COMMUNITY)
Admission: RE | Admit: 2015-12-21 | Discharge: 2015-12-21 | Disposition: A | Discharge status: Home / Self Care | Payer: Medicaid Other | Source: Ambulatory Visit | Attending: Obstetrics & Gynecology | Admitting: Obstetrics & Gynecology

## 2015-12-21 ENCOUNTER — Encounter (HOSPITAL_COMMUNITY): Payer: Self-pay

## 2015-12-21 DIAGNOSIS — Z01812 Encounter for preprocedural laboratory examination: Secondary | ICD-10-CM | POA: Diagnosis not present

## 2015-12-21 HISTORY — DX: Gastro-esophageal reflux disease without esophagitis: K21.9

## 2015-12-21 HISTORY — DX: Anxiety disorder, unspecified: F41.9

## 2015-12-21 HISTORY — DX: Restless legs syndrome: G25.81

## 2015-12-21 LAB — CBC
HEMATOCRIT: 31.1 % — AB (ref 36.0–46.0)
Hemoglobin: 10.2 g/dL — ABNORMAL LOW (ref 12.0–15.0)
MCH: 27.8 pg (ref 26.0–34.0)
MCHC: 32.8 g/dL (ref 30.0–36.0)
MCV: 84.7 fL (ref 78.0–100.0)
PLATELETS: 221 10*3/uL (ref 150–400)
RBC: 3.67 MIL/uL — ABNORMAL LOW (ref 3.87–5.11)
RDW: 16.2 % — AB (ref 11.5–15.5)
WBC: 5 10*3/uL (ref 4.0–10.5)

## 2015-12-21 LAB — COMPREHENSIVE METABOLIC PANEL
ALK PHOS: 70 U/L (ref 38–126)
ALT: 10 U/L — ABNORMAL LOW (ref 14–54)
ANION GAP: 6 (ref 5–15)
AST: 12 U/L — ABNORMAL LOW (ref 15–41)
Albumin: 3.5 g/dL (ref 3.5–5.0)
BILIRUBIN TOTAL: 0.3 mg/dL (ref 0.3–1.2)
BUN: 11 mg/dL (ref 6–20)
CALCIUM: 8.7 mg/dL — AB (ref 8.9–10.3)
CO2: 23 mmol/L (ref 22–32)
Chloride: 110 mmol/L (ref 101–111)
Creatinine, Ser: 0.63 mg/dL (ref 0.44–1.00)
Glucose, Bld: 74 mg/dL (ref 65–99)
Potassium: 4.2 mmol/L (ref 3.5–5.1)
Sodium: 139 mmol/L (ref 135–145)
TOTAL PROTEIN: 6.3 g/dL — AB (ref 6.5–8.1)

## 2015-12-21 LAB — URINALYSIS, ROUTINE W REFLEX MICROSCOPIC
BILIRUBIN URINE: NEGATIVE
Glucose, UA: NEGATIVE mg/dL
KETONES UR: NEGATIVE mg/dL
Leukocytes, UA: NEGATIVE
NITRITE: NEGATIVE
PH: 6 (ref 5.0–8.0)
Protein, ur: NEGATIVE mg/dL
Specific Gravity, Urine: 1.02 (ref 1.005–1.030)

## 2015-12-21 LAB — URINE MICROSCOPIC-ADD ON: WBC UA: NONE SEEN WBC/hpf (ref 0–5)

## 2015-12-21 LAB — HCG, QUANTITATIVE, PREGNANCY

## 2015-12-21 NOTE — Pre-Procedure Instructions (Signed)
Patient given information to sign up for my chart at home. 

## 2015-12-21 NOTE — Progress Notes (Signed)
   12/21/15 0916  OBSTRUCTIVE SLEEP APNEA  Have you ever been diagnosed with sleep apnea through a sleep study? No  Do you snore loudly (loud enough to be heard through closed doors)?  1  Do you often feel tired, fatigued, or sleepy during the daytime (such as falling asleep during driving or talking to someone)? 1  Has anyone observed you stop breathing during your sleep? 1  Do you have, or are you being treated for high blood pressure? 1  BMI more than 35 kg/m2? 0  Age > 50 (1-yes) 0  Neck circumference greater than:Female 16 inches or larger, Female 17inches or larger? 0  Female Gender (Yes=1) 0  Obstructive Sleep Apnea Score 4  Score 5 or greater  Results sent to PCP

## 2015-12-21 NOTE — Pre-Procedure Instructions (Signed)
Patient in for PAT. Has extensive history since September of 2016. States she has [facility]Z [facility] and the [facility] has called her twice to tell her that the "Doctor has been sick and canceled my appointment". She states has not has HCTZ  for about 2 months but is on clonidine. BP today 111/70. Dr Benancio DeedsNewsome aware of history including 3 stokes in September and seizures that came on suddenly in January 2017. She is on Keppra to control these and has had no seizures since January. Seizures are of unknown etiology thus far. She has appointment to return to Neurology in June. No further orders at present. Dr Benancio DeedsNewsome is ok with proceeding with surgery.

## 2015-12-31 ENCOUNTER — Encounter (HOSPITAL_COMMUNITY)
Admission: RE | Admit: 2015-12-31 | Discharge: 2015-12-31 | Disposition: A | Discharge status: Home / Self Care | Payer: Medicaid Other | Source: Ambulatory Visit | Attending: Obstetrics & Gynecology | Admitting: Obstetrics & Gynecology

## 2015-12-31 ENCOUNTER — Encounter (HOSPITAL_COMMUNITY): Payer: Self-pay

## 2016-01-06 ENCOUNTER — Ambulatory Visit (HOSPITAL_COMMUNITY)
Admission: RE | Admit: 2016-01-06 | Discharge: 2016-01-06 | Disposition: A | Discharge status: Home / Self Care | Payer: Medicaid Other | Source: Ambulatory Visit | Attending: Obstetrics & Gynecology | Admitting: Obstetrics & Gynecology

## 2016-01-06 ENCOUNTER — Encounter (HOSPITAL_COMMUNITY)
Admission: RE | Disposition: A | Discharge status: Home / Self Care | Payer: Self-pay | Source: Ambulatory Visit | Attending: Obstetrics & Gynecology

## 2016-01-06 ENCOUNTER — Encounter (HOSPITAL_COMMUNITY): Payer: Self-pay | Admitting: *Deleted

## 2016-01-06 ENCOUNTER — Ambulatory Visit (HOSPITAL_COMMUNITY): Payer: Medicaid Other | Admitting: Anesthesiology

## 2016-01-06 ENCOUNTER — Encounter: Payer: Medicaid Other | Admitting: Obstetrics & Gynecology

## 2016-01-06 DIAGNOSIS — K219 Gastro-esophageal reflux disease without esophagitis: Secondary | ICD-10-CM | POA: Diagnosis not present

## 2016-01-06 DIAGNOSIS — F17210 Nicotine dependence, cigarettes, uncomplicated: Secondary | ICD-10-CM | POA: Diagnosis not present

## 2016-01-06 DIAGNOSIS — Z8673 Personal history of transient ischemic attack (TIA), and cerebral infarction without residual deficits: Secondary | ICD-10-CM | POA: Insufficient documentation

## 2016-01-06 DIAGNOSIS — J45909 Unspecified asthma, uncomplicated: Secondary | ICD-10-CM | POA: Diagnosis not present

## 2016-01-06 DIAGNOSIS — R569 Unspecified convulsions: Secondary | ICD-10-CM | POA: Diagnosis not present

## 2016-01-06 DIAGNOSIS — Z302 Encounter for sterilization: Secondary | ICD-10-CM | POA: Insufficient documentation

## 2016-01-06 DIAGNOSIS — I10 Essential (primary) hypertension: Secondary | ICD-10-CM | POA: Diagnosis not present

## 2016-01-06 DIAGNOSIS — F419 Anxiety disorder, unspecified: Secondary | ICD-10-CM | POA: Insufficient documentation

## 2016-01-06 DIAGNOSIS — Z79899 Other long term (current) drug therapy: Secondary | ICD-10-CM | POA: Insufficient documentation

## 2016-01-06 HISTORY — PX: LAPAROSCOPIC BILATERAL SALPINGECTOMY: SHX5889

## 2016-01-06 LAB — RAPID URINE DRUG SCREEN, HOSP PERFORMED
Amphetamines: NOT DETECTED
BARBITURATES: NOT DETECTED
BENZODIAZEPINES: NOT DETECTED
Cocaine: NOT DETECTED
Opiates: NOT DETECTED
Tetrahydrocannabinol: POSITIVE — AB

## 2016-01-06 LAB — PREGNANCY, URINE: Preg Test, Ur: NEGATIVE

## 2016-01-06 SURGERY — SALPINGECTOMY, BILATERAL, LAPAROSCOPIC
Anesthesia: General | Laterality: Bilateral

## 2016-01-06 MED ORDER — KETOROLAC TROMETHAMINE 30 MG/ML IJ SOLN
INTRAMUSCULAR | Status: AC
  Filled 2016-01-06: qty 1

## 2016-01-06 MED ORDER — EPHEDRINE SULFATE 50 MG/ML IJ SOLN
INTRAMUSCULAR | Status: AC
  Filled 2016-01-06: qty 1

## 2016-01-06 MED ORDER — FENTANYL CITRATE (PF) 100 MCG/2ML IJ SOLN
25.0000 ug | INTRAMUSCULAR | Status: DC | PRN
  Administered 2016-01-06 (×2): 50 ug via INTRAVENOUS

## 2016-01-06 MED ORDER — SUCCINYLCHOLINE CHLORIDE 20 MG/ML IJ SOLN
INTRAMUSCULAR | Status: DC | PRN
  Administered 2016-01-06: 120 mg via INTRAVENOUS

## 2016-01-06 MED ORDER — PROPOFOL 10 MG/ML IV BOLUS
INTRAVENOUS | Status: DC | PRN
  Administered 2016-01-06: 130 mg via INTRAVENOUS

## 2016-01-06 MED ORDER — ONDANSETRON HCL 4 MG/2ML IJ SOLN
INTRAMUSCULAR | Status: AC
  Filled 2016-01-06: qty 2

## 2016-01-06 MED ORDER — SUCCINYLCHOLINE CHLORIDE 20 MG/ML IJ SOLN
INTRAMUSCULAR | Status: AC
  Filled 2016-01-06: qty 1

## 2016-01-06 MED ORDER — KETOROLAC TROMETHAMINE 10 MG PO TABS: 10.0000 mg | ORAL_TABLET | Freq: Three times a day (TID) | ORAL | Status: DC | PRN

## 2016-01-06 MED ORDER — BUPIVACAINE LIPOSOME 1.3 % IJ SUSP
INTRAMUSCULAR | Status: DC | PRN
  Administered 2016-01-06: 20 mL

## 2016-01-06 MED ORDER — SODIUM CHLORIDE 0.9 % IR SOLN
Status: DC | PRN
  Administered 2016-01-06: 1000 mL

## 2016-01-06 MED ORDER — LACTATED RINGERS IV SOLN
INTRAVENOUS | Status: DC
  Administered 2016-01-06: 09:00:00 via INTRAVENOUS

## 2016-01-06 MED ORDER — METOPROLOL TARTRATE 5 MG/5ML IV SOLN
INTRAVENOUS | Status: DC | PRN
  Administered 2016-01-06 (×2): 2.5 mg via INTRAVENOUS

## 2016-01-06 MED ORDER — ROCURONIUM BROMIDE 50 MG/5ML IV SOLN
INTRAVENOUS | Status: AC
  Filled 2016-01-06: qty 1

## 2016-01-06 MED ORDER — ONDANSETRON HCL 8 MG PO TABS: 8.0000 mg | ORAL_TABLET | Freq: Three times a day (TID) | ORAL | Status: DC | PRN

## 2016-01-06 MED ORDER — LIDOCAINE HCL (PF) 1 % IJ SOLN
INTRAMUSCULAR | Status: AC
  Filled 2016-01-06: qty 5

## 2016-01-06 MED ORDER — CEFAZOLIN SODIUM-DEXTROSE 2-4 GM/100ML-% IV SOLN
INTRAVENOUS | Status: AC
  Filled 2016-01-06: qty 100

## 2016-01-06 MED ORDER — METOPROLOL TARTRATE 5 MG/5ML IV SOLN
INTRAVENOUS | Status: AC
  Filled 2016-01-06: qty 5

## 2016-01-06 MED ORDER — BUPIVACAINE LIPOSOME 1.3 % IJ SUSP
20.0000 mL | Freq: Once | INTRAMUSCULAR | Status: DC
  Filled 2016-01-06: qty 20

## 2016-01-06 MED ORDER — ROCURONIUM BROMIDE 100 MG/10ML IV SOLN
INTRAVENOUS | Status: DC | PRN
  Administered 2016-01-06 (×2): 10 mg via INTRAVENOUS

## 2016-01-06 MED ORDER — NEOSTIGMINE METHYLSULFATE 10 MG/10ML IV SOLN
INTRAVENOUS | Status: AC
  Filled 2016-01-06: qty 1

## 2016-01-06 MED ORDER — CEFAZOLIN SODIUM-DEXTROSE 2-4 GM/100ML-% IV SOLN
2.0000 g | INTRAVENOUS | Status: AC
  Administered 2016-01-06: 2 g via INTRAVENOUS

## 2016-01-06 MED ORDER — FENTANYL CITRATE (PF) 100 MCG/2ML IJ SOLN
INTRAMUSCULAR | Status: DC | PRN
  Administered 2016-01-06 (×4): 50 ug via INTRAVENOUS

## 2016-01-06 MED ORDER — MIDAZOLAM HCL 2 MG/2ML IJ SOLN
INTRAMUSCULAR | Status: AC
  Filled 2016-01-06: qty 2

## 2016-01-06 MED ORDER — PROPOFOL 10 MG/ML IV BOLUS
INTRAVENOUS | Status: AC
  Filled 2016-01-06: qty 20

## 2016-01-06 MED ORDER — BUPIVACAINE LIPOSOME 1.3 % IJ SUSP
INTRAMUSCULAR | Status: AC
  Filled 2016-01-06: qty 20

## 2016-01-06 MED ORDER — HYDROCODONE-ACETAMINOPHEN 5-325 MG PO TABS: 1.0000 | ORAL_TABLET | Freq: Four times a day (QID) | ORAL | Status: DC | PRN

## 2016-01-06 MED ORDER — GLYCOPYRROLATE 0.2 MG/ML IJ SOLN
INTRAMUSCULAR | Status: DC | PRN
  Administered 2016-01-06: 0.2 mg via INTRAVENOUS
  Administered 2016-01-06: 0.6 mg via INTRAVENOUS

## 2016-01-06 MED ORDER — MIDAZOLAM HCL 2 MG/2ML IJ SOLN
1.0000 mg | INTRAMUSCULAR | Status: DC | PRN
  Administered 2016-01-06: 2 mg via INTRAVENOUS

## 2016-01-06 MED ORDER — GLYCOPYRROLATE 0.2 MG/ML IJ SOLN
INTRAMUSCULAR | Status: AC
  Filled 2016-01-06: qty 3

## 2016-01-06 MED ORDER — FENTANYL CITRATE (PF) 250 MCG/5ML IJ SOLN
INTRAMUSCULAR | Status: AC
  Filled 2016-01-06: qty 5

## 2016-01-06 MED ORDER — LIDOCAINE HCL (CARDIAC) 20 MG/ML IV SOLN
INTRAVENOUS | Status: DC | PRN
Start: 2016-01-06 — End: 2016-01-06
  Administered 2016-01-06: 50 mg via INTRAVENOUS

## 2016-01-06 MED ORDER — SODIUM CHLORIDE 0.9 % IJ SOLN
INTRAMUSCULAR | Status: AC
Start: 2016-01-06 — End: 2016-01-06
  Filled 2016-01-06: qty 10

## 2016-01-06 MED ORDER — KETOROLAC TROMETHAMINE 30 MG/ML IJ SOLN
30.0000 mg | Freq: Once | INTRAMUSCULAR | Status: AC
  Administered 2016-01-06: 30 mg via INTRAVENOUS

## 2016-01-06 MED ORDER — ONDANSETRON HCL 4 MG/2ML IJ SOLN: 4.0000 mg | Freq: Once | INTRAMUSCULAR | Status: DC | PRN

## 2016-01-06 MED ORDER — FENTANYL CITRATE (PF) 100 MCG/2ML IJ SOLN
INTRAMUSCULAR | Status: AC
  Filled 2016-01-06: qty 2

## 2016-01-06 MED ORDER — NEOSTIGMINE METHYLSULFATE 10 MG/10ML IV SOLN
INTRAVENOUS | Status: DC | PRN
  Administered 2016-01-06: 4 mg via INTRAVENOUS

## 2016-01-06 MED ORDER — LACTATED RINGERS IV SOLN
INTRAVENOUS | Status: DC | PRN
  Administered 2016-01-06 (×2): via INTRAVENOUS

## 2016-01-06 MED ORDER — ONDANSETRON HCL 4 MG/2ML IJ SOLN
4.0000 mg | Freq: Once | INTRAMUSCULAR | Status: AC
  Administered 2016-01-06: 4 mg via INTRAVENOUS

## 2016-01-06 SURGICAL SUPPLY — 40 items
BAG HAMPER (MISCELLANEOUS) ×3 IMPLANT
BLADE SURG SZ11 CARB STEEL (BLADE) ×3 IMPLANT
CLOTH BEACON ORANGE TIMEOUT ST (SAFETY) ×3 IMPLANT
COVER LIGHT HANDLE STERIS (MISCELLANEOUS) ×6 IMPLANT
DRAPE PROXIMA HALF (DRAPES) ×3 IMPLANT
ELECT REM PT RETURN 9FT ADLT (ELECTROSURGICAL) ×3
ELECTRODE REM PT RTRN 9FT ADLT (ELECTROSURGICAL) ×1 IMPLANT
FORMALIN 10 PREFIL 120ML (MISCELLANEOUS) ×4 IMPLANT
GLOVE BIOGEL PI IND STRL 7.0 (GLOVE) ×1 IMPLANT
GLOVE BIOGEL PI IND STRL 8 (GLOVE) ×1 IMPLANT
GLOVE BIOGEL PI INDICATOR 7.0 (GLOVE) ×2
GLOVE BIOGEL PI INDICATOR 8 (GLOVE) ×2
GLOVE ECLIPSE 6.5 STRL STRAW (GLOVE) ×2 IMPLANT
GLOVE ECLIPSE 8.0 STRL XLNG CF (GLOVE) ×3 IMPLANT
GLOVE SKINSENSE NS SZ6.5 (GLOVE) ×4
GLOVE SKINSENSE STRL SZ6.5 (GLOVE) IMPLANT
GOWN STRL REUS W/TWL LRG LVL3 (GOWN DISPOSABLE) ×3 IMPLANT
GOWN STRL REUS W/TWL XL LVL3 (GOWN DISPOSABLE) ×3 IMPLANT
INST SET LAPROSCOPIC GYN AP (KITS) ×3 IMPLANT
KIT ROOM TURNOVER AP CYSTO (KITS) ×3 IMPLANT
NDL HYPO 21X1.5 SAFETY (NEEDLE) ×1 IMPLANT
NEEDLE HYPO 21X1.5 SAFETY (NEEDLE) ×3 IMPLANT
NEEDLE INSUFFLATION 14GA 120MM (NEEDLE) ×3 IMPLANT
PACK PERI GYN (CUSTOM PROCEDURE TRAY) ×3 IMPLANT
PAD ARMBOARD 7.5X6 YLW CONV (MISCELLANEOUS) ×3 IMPLANT
SET BASIN LINEN APH (SET/KITS/TRAYS/PACK) ×3 IMPLANT
SHEARS HARMONIC ACE PLUS 36CM (ENDOMECHANICALS) ×3 IMPLANT
SLEEVE ENDOPATH XCEL 5M (ENDOMECHANICALS) ×3 IMPLANT
SOLUTION ANTI FOG 6CC (MISCELLANEOUS) ×3 IMPLANT
SPONGE GAUZE 2X2 8PLY STER LF (GAUZE/BANDAGES/DRESSINGS) ×3
SPONGE GAUZE 2X2 8PLY STRL LF (GAUZE/BANDAGES/DRESSINGS) ×6 IMPLANT
STAPLER VISISTAT 35W (STAPLE) ×3 IMPLANT
SUT VICRYL 0 UR6 27IN ABS (SUTURE) ×3 IMPLANT
SYR 20CC LL (SYRINGE) ×3 IMPLANT
SYRINGE 10CC LL (SYRINGE) ×3 IMPLANT
TAPE CLOTH SURG 4X10 WHT LF (GAUZE/BANDAGES/DRESSINGS) ×2 IMPLANT
TROCAR ENDO BLADELESS 11MM (ENDOMECHANICALS) ×3 IMPLANT
TROCAR XCEL NON-BLD 5MMX100MML (ENDOMECHANICALS) ×3 IMPLANT
TUBING INSUF HEATED (TUBING) ×3 IMPLANT
WARMER LAPAROSCOPE (MISCELLANEOUS) ×3 IMPLANT

## 2016-01-06 NOTE — H&P (Signed)
Preoperative History and Physical  Danielle Washington is a 40 y.o. No obstetric history on file. with No LMP recorded. Patient has had an injection. admitted for a laparoscopic bilateral salpingectomy for terilization.    PMH:  Past Medical History  Diagnosis Date  . Hypertension   . Asthma   . Stroke (HCC)   . Seizures (HCC)     PSH:  Past Surgical History  Procedure Laterality Date  . No past surgeries    . Tee without cardioversion N/A 05/26/2015    Procedure: TRANSESOPHAGEAL ECHOCARDIOGRAM (TEE); Surgeon: Quintella Reichert, MD; Location: [facility] ENDOSCOPY; Service: Cardiovascular; Laterality: N/A;    POb/GynH:  OB History    No data available      SH:  Social History  Substance Use Topics  . Smoking status: Current Every Day Smoker -- 1.00 packs/day for 20 years  . Smokeless tobacco: None  . Alcohol Use: 1.2 oz/week    1 Glasses of wine, 1 Shots of liquor per week     Comment: occasionally    FH:  Family History  Problem Relation Age of Onset  . Lung cancer Mother   . Heart disease Mother   . COPD Mother   . Heart attack Father   . Hypertension Father   . Stroke Maternal Grandmother   . Diabetes Maternal Grandmother      Allergies: No Known Allergies  Medications:  Current outpatient prescriptions:  . atorvastatin (LIPITOR) 20 MG tablet, TAKE 1 TABLET ONCE A DAY AT 6 P.M., Disp: 30 tablet, Rfl: 11 . cloNIDine (CATAPRES) 0.1 MG tablet, Take 1 tablet (0.1 mg total) by mouth 2 (two) times daily., Disp: 60 tablet, Rfl: 11 . esomeprazole (NEXIUM) 40 MG capsule, Take 1 capsule (40 mg total) by mouth 2 (two) times daily before a meal., Disp: 60 capsule, Rfl: 3 . famotidine (PEPCID) 20 MG tablet, Take 1 tablet (20 mg total) by mouth 2 (two) times daily as needed for heartburn., Disp: 60 tablet, Rfl: 3 . levETIRAcetam (KEPPRA) 500 MG tablet, Take 1 tablet (500 mg  total) by mouth 2 (two) times daily., Disp: 60 tablet, Rfl: 3 . promethazine (PHENERGAN) 12.5 MG tablet, Take 1 tablet (12.5 mg total) by mouth every 6 (six) hours as needed for nausea or vomiting., Disp: 30 tablet, Rfl: 0 . hydrochlorothiazide (HYDRODIURIL) 25 MG tablet, Take 0.5 tablets (12.5 mg total) by mouth daily. (Patient not taking: Reported on 11/23/2015), Disp: , Rfl:   Review of Systems:   Review of Systems  Constitutional: Negative for fever, chills, weight loss, malaise/fatigue and diaphoresis.  HENT: Negative for hearing loss, ear pain, nosebleeds, congestion, sore throat, neck pain, tinnitus and ear discharge.  Eyes: Negative for blurred vision, double vision, photophobia, pain, discharge and redness.  Respiratory: Negative for cough, hemoptysis, sputum production, shortness of breath, wheezing and stridor.  Cardiovascular: Negative for chest pain, palpitations, orthopnea, claudication, leg swelling and PND.  Gastrointestinal: Positive for abdominal pain. Negative for heartburn, nausea, vomiting, diarrhea, constipation, blood in stool and melena.  Genitourinary: Negative for dysuria, urgency, frequency, hematuria and flank pain.  Musculoskeletal: Negative for myalgias, back pain, joint pain and falls.  Skin: Negative for itching and rash.  Neurological: Negative for dizziness, tingling, tremors, sensory change, speech change, focal weakness, seizures, loss of consciousness, weakness and headaches.  Endo/Heme/Allergies: Negative for environmental allergies and polydipsia. Does not bruise/bleed easily.  Psychiatric/Behavioral: Negative for depression, suicidal ideas, hallucinations, memory loss and substance abuse. The patient is not nervous/anxious and does not have insomnia.  PHYSICAL EXAM:  Blood pressure 120/80, pulse 76, height 5\' 2"  (1.575 m), weight 158 lb (71.668 kg).   Vitals reviewed. Constitutional: She is oriented to person, place, and time. She appears  well-developed and well-nourished.  HENT:  Head: Normocephalic and atraumatic.  Right Ear: External ear normal.  Left Ear: External ear normal.  Nose: Nose normal.  Mouth/Throat: Oropharynx is clear and moist.  Eyes: Conjunctivae and EOM are normal. Pupils are equal, round, and reactive to light. Right eye exhibits no discharge. Left eye exhibits no discharge. No scleral icterus.  Neck: Normal range of motion. Neck supple. No tracheal deviation present. No thyromegaly present.  Cardiovascular: Normal rate, regular rhythm, normal heart sounds and intact distal pulses. Exam reveals no gallop and no friction rub.  No murmur heard. Respiratory: Effort normal and breath sounds normal. No respiratory distress. She has no wheezes. She has no rales. She exhibits no tenderness.  GI: Soft. Bowel sounds are normal. She exhibits no distension and no mass. There is tenderness. There is no rebound and no guarding.  Genitourinary:   Vulva is normal without lesions Vagina is pink moist without discharge Cervix normal in appearance and pap is normal Uterus is normal size, contour, position, consistency, mobility, non-tender Adnexa is negative with normal sized ovaries by sonogram  Musculoskeletal: Normal range of motion. She exhibits no edema and no tenderness.  Neurological: She is alert and oriented to person, place, and time. She has normal reflexes. She displays normal reflexes. No cranial nerve deficit. She exhibits normal muscle tone. Coordination normal.  Skin: Skin is warm and dry. No rash noted. No erythema. No pallor.  Psychiatric: She has a normal mood and affect. Her behavior is normal. Judgment and thought content normal.    Labs: No results found for this or any previous visit (from the past 336 hour(s)).  EKG: Orders placed or performed during the hospital encounter of 09/05/15  . EKG 12-Lead  . EKG 12-Lead  . EKG    Imaging Studies:  Imaging Results    No results  found.      Assessment: Multiparous female desires sterilization, chooses bilateral salpingectomy for ovarian cancer prophylaxis recnt CVA, no longer can take hormonal contraception   Patient Active Problem List   Diagnosis Date Noted  . GI bleed 09/06/2015  . 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 Hypertension 09/05/2015  . Asthma 09/05/2015  . Aspiration into respiratory tract 09/05/2015  . 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 Agitation   . Emesis   . Nausea and vomiting 05/23/2015  . Acute ischemic stroke (HCC) 05/23/2015  . Substance abuse 05/23/2015  . Tobacco abuse 05/23/2015  . GERD (gastroesophageal reflux disease) 05/23/2015  . Stroke (cerebrum) (HCC) 05/23/2015  . Stroke of unknown etiology (HCC)   . Nausea with vomiting     Plan: Multiparous female desires sterilization desires permanent sterilization, pt no longer a candidate for hormonal contraception due to stroke suffered in October 2016  Jillian Warth H    Current Outpatient Prescriptions on File Prior to Visit  Medication Sig Dispense Refill  . atorvastatin (LIPITOR) 20 MG tablet TAKE 1 TABLET ONCE A DAY AT 6 P.M. 30 tablet 11  . cloNIDine (CATAPRES) 0.1 MG tablet Take 1 tablet (0.1 mg total) by mouth 2 (two) times daily. 60 tablet 11  . esomeprazole (NEXIUM) 40 MG capsule Take 1 capsule (40 mg total) by mouth 2 (two) times daily before a meal. 60 capsule 3  . famotidine (PEPCID) 20 MG tablet Take  1 tablet (20 mg total) by mouth 2 (two) times daily as needed for heartburn. 60 tablet 3  . levETIRAcetam (KEPPRA) 500 MG tablet Take 1 tablet (500 mg total) by mouth 2 (two) times daily. 60 tablet 3  . promethazine (PHENERGAN) 12.5 MG tablet Take 1 tablet (12.5 mg total) by mouth every 6 (six) hours as needed for nausea or vomiting. 30 tablet 0  . hydrochlorothiazide (HYDRODIURIL) 25 MG tablet Take 0.5  tablets (12.5 mg total) by mouth daily. (Patient not taking: Reported on 11/23/2015)

## 2016-01-06 NOTE — Transfer of Care (Signed)
Immediate Anesthesia Transfer of Care Note  Patient: Danielle Washington  Procedure(s) Performed: Procedure(s): LAPAROSCOPIC BILATERAL SALPINGECTOMY FOR STERILIZATION (Bilateral)  Patient Location: PACU  Anesthesia Type:General  Level of Consciousness: awake, oriented and patient cooperative  Airway & Oxygen Therapy: Patient Spontanous Breathing and Patient connected to face mask  Post-op Assessment: Report given to RN and Post -op Vital signs reviewed and stable  Post vital signs: Reviewed and stable  Last Vitals:  Filed Vitals:   01/06/16 0752 01/06/16 1020  BP: 116/73 145/97  Pulse: 52   Resp: 18     Last Pain: There were no vitals filed for this visit.    Patients Stated Pain Goal: 8 (01/06/16 0752)  Complications: No apparent anesthesia complications

## 2016-01-06 NOTE — Op Note (Signed)
Preoperative Diagnosis:  1.  Multiparous female desires permanent sterilization                                          2.  Elects to have bilateral salpingectomy for ovarian cancer                                                     prophylaxis  Postoperative Diagnosis:  Same as above  Procedure:  Laparoscopic Bilateral Salpingectomy for sterilization procedure  Surgeon:  Rockne CoonsLuther H Eure  Jr MD  Anaesthesia: general  Findings:  Patient had normal pelvic anatomy and no intraperitoneal abnormalities.  Description of Operation:  Patient was taken to the OR and placed into supine position where she underwent general anaesthesia.  She was placed in the dorsal lithotomy position and prepped and draped in the usual sterile fashion.  An incision was made in the umbilicus and dissection taken down to the rectus fascia which was incised and opened.  The non bladed trocar was then placed and the peritoneal cavity was insufflated.  The above noted findings were observed.  Additional trocars were placed in the right and left lower quadrants under direct visualization without difficulty.  The Harmonic scalpel was employed and salpingectomy of both the right and left tubes was performed.   The tubes were removed from the peritoneal cavity and sent to pathology.  There was good hemostasis bilaterally.  The fascia, peritoneum and subcutaneous tissue were closed using 0 vicryl.  The skin was closed using staples.  Exparel 266 mg 20 cc was injected in the 3 incision trocar sites. The patient was awakened from anaesthesia and taken to the PACU with all counts being correct x 3.  The patient received  1 gram of ancef andToradol 30 mg IV preoperatively.  EURE,LUTHER H 01/06/2016 10:10 AM

## 2016-01-06 NOTE — Anesthesia Postprocedure Evaluation (Signed)
Anesthesia Post Note  Patient: Danielle Washington  Procedure(s) Performed: Procedure(s) (LRB): LAPAROSCOPIC BILATERAL SALPINGECTOMY FOR STERILIZATION (Bilateral)  Patient location during evaluation: PACU Anesthesia Type: General Level of consciousness: awake and alert and oriented Pain management: pain level controlled Respiratory status: spontaneous breathing and patient connected to face mask oxygen Cardiovascular status: stable Postop Assessment: no signs of nausea or vomiting Anesthetic complications: no    Last Vitals:  Filed Vitals:   01/06/16 0920 01/06/16 1020  BP: 127/74 145/97  Pulse:  70  Resp: 11 10    Last Pain: There were no vitals filed for this visit.               Shaylea Ucci A

## 2016-01-06 NOTE — Discharge Instructions (Signed)
Laparoscopic Tubal Ligation, Care After °Refer to this sheet in the next few weeks. These instructions provide you with information about caring for yourself after your procedure. Your health care provider may also give you more specific instructions. Your treatment has been planned according to current medical practices, but problems sometimes occur. Call your health care provider if you have any problems or questions after your procedure. °WHAT TO EXPECT AFTER THE PROCEDURE °After your procedure, it is common to have: °· Sore throat. °· Soreness at the incision site. °· Mild cramping. °· Tiredness. °· Mild nausea or vomiting. °· Shoulder pain. °HOME CARE INSTRUCTIONS °· Rest for the remainder of the day. °· Take medicines only as directed by your health care provider. These include over-the-counter medicines and prescription medicines. Do not take aspirin, which can cause bleeding. °· Over the next few days, gradually return to your normal activities and your normal diet. °· Avoid sexual intercourse for 2 weeks or as directed by your health care provider. °· Do not use tampons, and do not douche. °· Do not drive or operate heavy machinery while taking pain medicine. °· Do not lift anything that is heavier than 5 lb (2.3 kg) for 2 weeks or as directed by your health care provider. °· Do not take baths. Take showers only. Ask your health care provider when you can start taking baths. °· Take your temperature twice each day and write it down. °· Try to have help for your household needs for the first 7-10 days. °· There are many different ways to close and cover an incision, including stitches (sutures), skin glue, and adhesive strips. Follow instructions from your health care provider about: °¨ Incision care. °¨ Bandage (dressing) changes and removal. °¨ Incision closure removal. °· Check your incision area every day for signs of infection. Watch for: °¨ Redness, swelling, or pain. °¨ Fluid, blood, or pus. °· Keep  all follow-up visits as directed by your health care provider. °SEEK MEDICAL CARE IF: °· You have redness, swelling, or increasing pain in your incision area. °· You have fluid, blood, or pus coming from your incision for longer than 1 day. °· You notice a bad smell coming from your incision or your dressing. °· The edges of your incision break open after the sutures have been removed. °· Your pain does not decrease after 2-3 days. °· You have a rash. °· You repeatedly become dizzy or light-headed. °· You have a reaction to your medicine. °· Your pain medicine is not helping. °· You are constipated. °SEEK IMMEDIATE MEDICAL CARE IF: °· You have a fever. °· You faint. °· You have increasing pain in your abdomen. °· You have severe pain in one or both of your shoulders. °· You have bleeding or drainage from your suture sites or your vagina after surgery. °· You have shortness of breath or have difficulty breathing. °· You have chest pain or leg pain. °· You have ongoing nausea, vomiting, or diarrhea. °  °This information is not intended to replace advice given to you by your health care provider. Make sure you discuss any questions you have with your health care provider. °  °Document Released: 02/25/2005 Document Revised: 12/23/2014 Document Reviewed: 11/19/2011 °Elsevier Interactive Patient Education ©2016 [employer] ° °

## 2016-01-06 NOTE — Anesthesia Procedure Notes (Signed)
Procedure Name: Intubation Date/Time: 01/06/2016 9:32 AM Performed by: Pernell DupreADAMS, AMY A Pre-anesthesia Checklist: Timeout performed, Patient identified, Emergency Drugs available, Suction available and Patient being monitored Patient Re-evaluated:Patient Re-evaluated prior to inductionOxygen Delivery Method: Circle system utilized Preoxygenation: Pre-oxygenation with 100% oxygen Intubation Type: IV induction, Rapid sequence and Cricoid Pressure applied Laryngoscope Size: Miller and 3 Grade View: Grade I Tube type: Oral Tube size: 7.0 mm Number of attempts: 1 Airway Equipment and Method: Stylet Placement Confirmation: ETT inserted through vocal cords under direct vision,  positive ETCO2 and breath sounds checked- equal and bilateral Secured at: 21 cm Tube secured with: Tape Dental Injury: Teeth and Oropharynx as per pre-operative assessment

## 2016-01-06 NOTE — Anesthesia Preprocedure Evaluation (Signed)
Anesthesia Evaluation  Patient identified by MRN, date of birth, ID band Patient awake    Reviewed: Allergy & Precautions, NPO status , Patient's Chart, lab work & pertinent test results  Airway Mallampati: I  TM Distance: >3 FB     Dental  (+) Edentulous Upper, Edentulous Lower   Pulmonary asthma , Current Smoker,    breath sounds clear to auscultation       Cardiovascular hypertension, Pt. on medications  Rhythm:Regular Rate:Normal     Neuro/Psych Seizures -,  PSYCHIATRIC DISORDERS Anxiety CVA ( Numbness and tingling of hands is only deficit.), Residual Symptoms    GI/Hepatic GERD  ,  Endo/Other    Renal/GU      Musculoskeletal   Abdominal   Peds  Hematology   Anesthesia Other Findings   Reproductive/Obstetrics                             Anesthesia Physical Anesthesia Plan  ASA: III  Anesthesia Plan: General   Post-op Pain Management:    Induction: Intravenous, Rapid sequence and Cricoid pressure planned  Airway Management Planned: Oral ETT  Additional Equipment:   Intra-op Plan:   Post-operative Plan: Extubation in OR  Informed Consent: I have reviewed the patients History and Physical, chart, labs and discussed the procedure including the risks, benefits and alternatives for the proposed anesthesia with the patient or authorized representative who has indicated his/her understanding and acceptance.     Plan Discussed with:   Anesthesia Plan Comments:         Anesthesia Quick Evaluation

## 2016-01-06 NOTE — Addendum Note (Signed)
Addendum  created 01/06/16 1030 by Earleen NewportAmy A Adams, CRNA   Modules edited: Anesthesia Medication Administration

## 2016-01-07 ENCOUNTER — Encounter (HOSPITAL_COMMUNITY): Payer: Self-pay | Admitting: Obstetrics & Gynecology

## 2016-01-07 ENCOUNTER — Telehealth: Payer: Self-pay | Admitting: Obstetrics & Gynecology

## 2016-01-07 NOTE — Telephone Encounter (Signed)
Hydrocodone does not have aspirin in it She can avoid taking the ketorolac or toradol The tylenol in the lortab should not be a problem for her stomach

## 2016-01-07 NOTE — Telephone Encounter (Signed)
Pt states she had surgery (tubal ligation) with Dr. Despina HiddenEure yesterday, unable to take anything with Tylenol or Aspirin, "Hydrocodone not agreeing with stomach." Pt states has a HX of H Pylori. Please advise.

## 2016-01-13 ENCOUNTER — Encounter: Payer: Self-pay | Admitting: Obstetrics & Gynecology

## 2016-01-13 ENCOUNTER — Ambulatory Visit (INDEPENDENT_AMBULATORY_CARE_PROVIDER_SITE_OTHER): Payer: Medicaid Other | Admitting: Obstetrics & Gynecology

## 2016-01-13 VITALS — BP 110/70 | HR 72 | Wt 153.0 lb

## 2016-01-13 DIAGNOSIS — Z9889 Other specified postprocedural states: Secondary | ICD-10-CM

## 2016-01-13 MED ORDER — SUCRALFATE 1 G PO TABS: 1.0000 g | ORAL_TABLET | Freq: Three times a day (TID) | ORAL | Status: DC

## 2016-01-13 NOTE — Progress Notes (Signed)
Patient ID: Danielle MountRenee Washington, female   DOB: 07/05/1976, 40 y.o.   MRN: [id number]  HPI: Patient returns for routine postoperative follow-up having undergone laparoscopic bilateral salpingectomy for sterilization on 01/06/2016.  The patient's immediate postoperative recovery has been unremarkable. Since hospital discharge the patient reports no problems.   Current Outpatient Prescriptions: aspirin EC 81 MG tablet, Take 81 mg by mouth daily., Disp: , Rfl:  atorvastatin (LIPITOR) 20 MG tablet, TAKE 1 TABLET ONCE A DAY AT 6 P.M., Disp: 30 tablet, Rfl: 11 cloNIDine (CATAPRES) 0.1 MG tablet, Take 1 tablet (0.1 mg total) by mouth 2 (two) times daily., Disp: 60 tablet, Rfl: 11 esomeprazole (NEXIUM) 40 MG capsule, Take 1 capsule (40 mg total) by mouth 2 (two) times daily before a meal., Disp: 60 capsule, Rfl: 3 famotidine (PEPCID) 20 MG tablet, Take 1 tablet (20 mg total) by mouth 2 (two) times daily as needed for heartburn., Disp: 60 tablet, Rfl: 3 hydrochlorothiazide (HYDRODIURIL) 25 MG tablet, Take 0.5 tablets (12.5 mg total) by mouth daily., Disp: , Rfl:  HYDROcodone-acetaminophen (NORCO/VICODIN) 5-325 MG tablet, Take 1 tablet by mouth every 6 (six) hours as needed., Disp: 30 tablet, Rfl: 0 ketorolac (TORADOL) 10 MG tablet, Take 1 tablet (10 mg total) by mouth every 8 (eight) hours as needed., Disp: 15 tablet, Rfl: 0 levETIRAcetam (KEPPRA) 500 MG tablet, Take 1 tablet (500 mg total) by mouth 2 (two) times daily., Disp: 60 tablet, Rfl: 3 ondansetron (ZOFRAN) 8 MG tablet, Take 1 tablet (8 mg total) by mouth every 8 (eight) hours as needed for nausea., Disp: 12 tablet, Rfl: 0 sucralfate (CARAFATE) 1 g tablet, Take 1 tablet (1 g total) by mouth 4 (four) times daily -  with meals and at bedtime., Disp: 120 tablet, Rfl: 11  No current facility-administered medications for this visit.    Blood pressure 110/70, pulse 72, weight 153 lb (69.4 kg).  Physical Exam: Incisions x 3 clean dry intact  Diagnostic  Tests: none  Pathology: benign  Impression: S/p laparoscopic bilateral salpingectomy  Plan:   Follow up: 6 months  Lazaro ArmsEURE,LUTHER H, MD  Meds ordered this encounter  Medications  . sucralfate (CARAFATE) 1 g tablet    Sig: Take 1 tablet (1 g total) by mouth 4 (four) times daily -  with meals and at bedtime.    Dispense:  120 tablet    Refill:  11

## 2016-01-26 ENCOUNTER — Encounter: Payer: Self-pay | Admitting: Neurology

## 2016-01-26 ENCOUNTER — Ambulatory Visit (INDEPENDENT_AMBULATORY_CARE_PROVIDER_SITE_OTHER): Payer: Medicaid Other | Admitting: Neurology

## 2016-01-26 VITALS — BP 124/7 | HR 65 | Ht 62.0 in | Wt 150.8 lb

## 2016-01-26 DIAGNOSIS — G40209 Localization-related (focal) (partial) symptomatic epilepsy and epileptic syndromes with complex partial seizures, not intractable, without status epilepticus: Secondary | ICD-10-CM

## 2016-01-26 MED ORDER — LEVETIRACETAM 500 MG PO TABS: 500.0000 mg | ORAL_TABLET | Freq: Two times a day (BID) | ORAL | Status: DC

## 2016-01-26 NOTE — Progress Notes (Signed)
[facility] [address] [place]. [place] [address] [phone]       OFFICE FOLLOW-UP NOTE  [name] Date of Birth:  1976/02/10 Medical Record Number:  [id number]   HPI: 82 year Caucasian lady seen today for first office follow-up visit following hospital admission for stroke in October 2016.Danielle Washington is a 40 y.o. female with a history of substance abuse (cocaine in the past), tobacco use, hypertension, and asthma admitted to [facility] 05/23/15 with a two-week history of daily vomiting, generalized weakness, mild confusion and some agitation. She has also noted some intermittent word finding difficulties. An MRI 05/23/15 personally reviewed by me revealed acute/subacute nonhemorrhagic infarcts posterior superior right frontal lobe and mid superior left frontal lobe possibly embolic. She was not on antiplatelet therapy prior to admission. Her urine drug screen was positive for opiates, and amphetamines, and THC. The patient reported that she has been prescribed Adderall by Dr. Harrington Challenger a psychiatrist in [place] for difficulty concentrating. A urine pregnancy test  was negative. She has been on Depo-Provera - a thromboembolic risk. She denies headache, palpitations, or visual problems. She reports both her mother and grandmother had strokes. Her grandmother had multiple strokes secondary to atrial fibrillation. Date last known well: Unable to determine Time last known well: Unable to determine tPA Given: No: Late presentation CT angiogram of the head showed no large vessel stenosis and CT angiogram of the neck was also unremarkable. MRI scan which I personally reviewed confirmed acute to subacute infarcts involving superior right and left frontal lobes as well as a questionable tiny and left peri-operculum infarct. Transthoracic echo showed normal ejection fraction without cardiac source of embolism. Transesophageal echocardiogram showed no cardiac source of embolism  or PFO. LDL cholesterol was elevated at 98 mg percent and hemoglobin A1c was 5.8. Complement levels, ANA panel, ESR, RPR, HIV antibody were all normal.  Patient was counseled to quit smoking but states that she is only cutback at present to half pack per day and is trying to quit. She was advised not to take Depo-Provera shots. She is on aspirin 81 mg which is tolerating well as well as Lipitor and she needs a refill for that. She has started eating healthy and exercising. She is trying to find herself a primary care physician. She has no complaints today and states she's return back to her baseline. Marland Kitchen Update 01/26/2016 : She returns for follow-up today after last visit 6 months ago. She is accompanied by her young son. She was hospitalized at [facility], hospital on 09/07/15 with new onset seizures. She states that she was sitting when she noticed a flash of light and apparently did not remember anything after that. However does describe that she was staring and had tonic clonic movements with some tongue bite. She had further episodes in transport with EMS and total of 3 episodes. 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 EEG showed bifrontal rhythmic delta slowing which was nonepileptic. Patient was started on Keppra and she made gradual recovery over a few days. Patient states she did well and she just ran out of Keppra to variable and maintain this appointment to be seen for a refill. She is starting Keppra well she does feel tired but denies any other side effects. She remains on aspirin which  is tolerating well without bleeding or bruising. She has not had any recurrent stroke or TIA symptoms. She states her blood pressure is under good control and today it is 124/77. She states her lipid  profile is also been good she is tolerating her Lipitor without muscle aches or pains. She doesn't have family history of epilepsy and her oldest son who started at age 71 and was found to have a brain tumor. ROS:   14 system review of systems is positive for restless leg, fatigue, eye itching, seizure, agitation, nervousness, aching muscles, neck stiffness, snoring numbness, restless legs and all other systems negative  PMH:  Past Medical History  Diagnosis Date  . Hypertension   . Asthma   . Seizures ([facility])     09/04/2015. Unknown etiology and on Keppra  . Stroke [facility])     x3 in September 2016. Numbness and tingling of hands is only deficit.  Marland Kitchen Restless leg syndrome   . Anxiety   . GERD (gastroesophageal reflux disease)     Social History:  Social History   Social History  . Marital Status: Legally Separated    Spouse Name: N/A  . Number of Children: N/A  . Years of Education: N/A   Occupational History  . Not on file.   Social History Main Topics  . Smoking status: Current Every Day Smoker -- 1.00 packs/day for 20 years    Types: Cigarettes  . Smokeless tobacco: Not on file  . Alcohol Use: 1.2 oz/week    1 Glasses of wine, 1 Shots of liquor per week     Comment: occasionally  . Drug Use: No  . Sexual Activity: No   Other Topics Concern  . Not on file   Social History Narrative    Medications:   Current Outpatient Prescriptions on File Prior to Visit  Medication Sig Dispense Refill  . aspirin EC 81 MG tablet Take 81 mg by mouth daily.    Marland Kitchen atorvastatin (LIPITOR) 20 MG tablet TAKE 1 TABLET ONCE A DAY AT 6 P.M. 30 tablet 11  . cloNIDine (CATAPRES) 0.1 MG tablet Take 1 tablet (0.1 mg total) by mouth 2 (two) times daily. 60 tablet 11  . esomeprazole (NEXIUM) 40 MG capsule Take 1 capsule (40 mg total) by mouth 2 (two) times daily before a meal. 60 capsule 3  . famotidine (PEPCID) 20 MG tablet Take 1 tablet (20 mg total) by mouth 2 (two) times daily as needed for  heartburn. 60 tablet 3  . ondansetron (ZOFRAN) 8 MG tablet Take 1 tablet (8 mg total) by mouth every 8 (eight) hours as needed for nausea. 12 tablet 0  . sucralfate (CARAFATE) 1 g tablet Take 1 tablet (1 g total) by mouth 4 (four) times daily -  with meals and at bedtime. 120 tablet 11  . hydrochlorothiazide (HYDRODIURIL) 25 MG tablet Take 0.5 tablets (12.5 mg total) by mouth daily. (Patient not taking: Reported on 01/26/2016)     No current facility-administered medications on file prior to visit.    Allergies:  No Known Allergies  Physical Exam General: well developed, well nourished young Caucasian lady, seated, in no evident distress Head: head normocephalic and atraumatic.  Neck: supple with no carotid or supraclavicular bruits Cardiovascular: regular rate and rhythm, no murmurs Musculoskeletal: no deformity Skin:  no rash/petichiae Vascular:  Normal pulses all extremities Filed Vitals:   01/26/16 1640  BP: 124/7  Pulse: 65   Neurologic Exam Mental Status: Awake and fully alert. Oriented  to place and time. Recent and remote memory intact. Attention span, concentration and fund of knowledge appropriate. Mood and affect appropriate.  Cranial Nerves: Fundoscopic exam reveals sharp disc margins. Pupils equal, briskly reactive to light. Extraocular movements full without nystagmus. Visual fields full to confrontation. Hearing intact. Facial sensation intact. Face, tongue, palate moves normally and symmetrically.  Motor: Normal bulk and tone. Normal strength in all tested extremity muscles. Sensory.: intact to touch ,pinprick .position and vibratory sensation.  Coordination: Rapid alternating movements normal in all extremities. Finger-to-nose and heel-to-shin performed accurately bilaterally. Gait and Station: Arises from chair without difficulty. Stance is normal. Gait demonstrates normal stride length and balance . Able to heel, toe and tandem walk with slight difficulty.  Reflexes: 1+  and symmetric. Toes downgoing.       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 New onset symptomatic seizures in January 2017    PLAN: I had a [place] discussion with the patient regarding her new diagnosis of partial complex seizures and recommend she stay on Keppra 500 mg twice daily. I give her refills and counseled her to be compliant with it. She was also advised to avoid seizure provoking stimuli like sleep deprivation, medication noncompliance, stimulants like alcohol and street drugs. She was advised also to be careful and limit her night driving. She will continue on aspirin for stroke prevention and maintain strict control of hypertension with blood pressure goal below 130/90 and lipids with LDL cholesterol goal below 70 mg percent. Greater than 50% time during this 25 minute visit was spent on counseling and coordination of care about seizures and stroke prevention. She will return for follow-up in 6 months with nurse practitioner call earlier if necessary. Antony Contras, MD Note: This document was prepared with digital dictation and possible smart phrase technology. Any transcriptional errors that result from this process are unintentional

## 2016-01-26 NOTE — Patient Instructions (Addendum)
I had a [place] discussion with the patient regarding her new diagnosis of partial complex seizures and recommend she stay on Keppra 500 mg twice daily. I give her refills and counseled her to be compliant with it. She was also advised to avoid seizure provoking stimuli like sleep deprivation, medication noncompliance, stimulants like alcohol and street drugs. She was advised also to be careful and limit her night driving. She will continue on aspirin for stroke prevention and maintain strict control of hypertension with blood pressure goal below 130/90 and lipids with LDL cholesterol goal below 70 mg percent. She will return for follow-up in 6 months with nurse practitioner call earlier if necessary. Epilepsy Epilepsy is a disorder in which a person has repeated seizures over time. A seizure is a release of abnormal electrical activity in the brain. Seizures can cause a change in attention, behavior, or the ability to remain awake and alert (altered mental status). Seizures often involve uncontrollable shaking (convulsions).  Most people with epilepsy lead normal lives. However, people with epilepsy are at an increased risk of falls, accidents, and injuries. Therefore, it is important to begin treatment right away. CAUSES  Epilepsy has many possible causes. Anything that disturbs the normal pattern of brain cell activity can lead to seizures. This may include:   Head injury.  Birth trauma.  High fever as a child.  Stroke.  Bleeding into or around the brain.  Certain drugs.  Prolonged low oxygen, such as what occurs after CPR efforts.  Abnormal brain development.  Certain illnesses, such as meningitis, encephalitis (brain infection), malaria, and other infections.  An imbalance of nerve signaling chemicals (neurotransmitters).  SIGNS AND SYMPTOMS  The symptoms of a seizure can vary greatly from one person to another. Right before a seizure, you may have a warning (aura) that a seizure is  about to occur. An aura may include the following symptoms:  Fear or anxiety.  Nausea.  Feeling like the room is spinning (vertigo).  Vision changes, such as seeing flashing lights or spots. Common symptoms during a seizure include:  Abnormal sensations, such as an abnormal smell or a bitter taste in the mouth.   Sudden, general body stiffness.   Convulsions that involve rhythmic jerking of the face, arm, or leg on one or both sides.   Sudden change in consciousness.   Appearing to be awake but not responding.   Appearing to be asleep but cannot be awakened.   Grimacing, chewing, lip smacking, drooling, tongue biting, or loss of bowel or bladder control. After a seizure, you may feel sleepy for a while. DIAGNOSIS  Your health care provider will ask about your symptoms and take a medical history. Descriptions from any witnesses to your seizures will be very helpful in the diagnosis. A physical exam, including a detailed neurological exam, is necessary. Various tests may be done, such as:   An electroencephalogram (EEG). This is a painless test of your brain waves. In this test, a diagram is created of your brain waves. These diagrams can be interpreted by a specialist.  An MRI of the brain.   A CT scan of the brain.   A spinal tap (lumbar puncture, LP).  Blood tests to check for signs of infection or abnormal blood chemistry. TREATMENT  There is no cure for epilepsy, but it is generally treatable. Once epilepsy is diagnosed, it is important to begin treatment as soon as possible. For most people with epilepsy, seizures can be controlled with medicines. The following  may also be used:  A pacemaker for the brain (vagus nerve stimulator) can be used for people with seizures that are not well controlled by medicine.  Surgery on the brain. For some people, epilepsy eventually goes away. HOME CARE INSTRUCTIONS   Follow your health care provider's recommendations on  driving and safety in normal activities.  Get enough rest. Lack of sleep can cause seizures.  Only take over-the-counter or prescription medicines as directed by your health care provider. Take any prescribed medicine exactly as directed.  Avoid any known triggers of your seizures.  Keep a seizure diary. Record what you recall about any seizure, especially any possible trigger.   Make sure the people you live and work with know that you are prone to seizures. They should receive instructions on how to help you. In general, a witness to a seizure should:   Cushion your head and body.   Turn you on your side.   Avoid unnecessarily restraining you.   Not place anything inside your mouth.   Call for emergency medical help if there is any question about what has occurred.   Follow up with your health care provider as directed. You may need regular blood tests to monitor the levels of your medicine.  SEEK MEDICAL CARE IF:   You develop signs of infection or other illness. This might increase the risk of a seizure.   You seem to be having more frequent seizures.   Your seizure pattern is changing.  SEEK IMMEDIATE MEDICAL CARE IF:   You have a seizure that does not stop after a few moments.   You have a seizure that causes any difficulty in breathing.   You have a seizure that results in a very severe headache.   You have a seizure that leaves you with the inability to speak or use a part of your body.    This information is not intended to replace advice given to you by your health care provider. Make sure you discuss any questions you have with your health care provider.   Document Released: 08/08/2005 Document Revised: 05/29/2013 Document Reviewed: 03/20/2013 Elsevier Interactive Patient Education [employer]

## 2016-07-13 ENCOUNTER — Ambulatory Visit: Payer: Medicaid Other | Admitting: Obstetrics & Gynecology

## 2016-07-27 ENCOUNTER — Ambulatory Visit: Payer: Medicaid Other | Admitting: Nurse Practitioner

## 2016-10-23 ENCOUNTER — Emergency Department (HOSPITAL_COMMUNITY)
Admission: EM | Admit: 2016-10-23 | Discharge: 2016-10-23 | Disposition: A | Discharge status: Home / Self Care | Payer: Self-pay | Attending: Emergency Medicine | Admitting: Emergency Medicine

## 2016-10-23 ENCOUNTER — Encounter (HOSPITAL_COMMUNITY): Payer: Self-pay | Admitting: Emergency Medicine

## 2016-10-23 ENCOUNTER — Emergency Department (HOSPITAL_COMMUNITY): Payer: Self-pay

## 2016-10-23 DIAGNOSIS — I10 Essential (primary) hypertension: Secondary | ICD-10-CM | POA: Insufficient documentation

## 2016-10-23 DIAGNOSIS — E876 Hypokalemia: Secondary | ICD-10-CM | POA: Insufficient documentation

## 2016-10-23 DIAGNOSIS — M545 Low back pain: Secondary | ICD-10-CM | POA: Insufficient documentation

## 2016-10-23 DIAGNOSIS — R569 Unspecified convulsions: Secondary | ICD-10-CM

## 2016-10-23 DIAGNOSIS — F17210 Nicotine dependence, cigarettes, uncomplicated: Secondary | ICD-10-CM | POA: Insufficient documentation

## 2016-10-23 DIAGNOSIS — J45909 Unspecified asthma, uncomplicated: Secondary | ICD-10-CM | POA: Insufficient documentation

## 2016-10-23 DIAGNOSIS — Z7982 Long term (current) use of aspirin: Secondary | ICD-10-CM | POA: Insufficient documentation

## 2016-10-23 DIAGNOSIS — E860 Dehydration: Secondary | ICD-10-CM | POA: Insufficient documentation

## 2016-10-23 DIAGNOSIS — Z8673 Personal history of transient ischemic attack (TIA), and cerebral infarction without residual deficits: Secondary | ICD-10-CM | POA: Insufficient documentation

## 2016-10-23 LAB — COMPREHENSIVE METABOLIC PANEL
ALT: 14 U/L (ref 14–54)
AST: 21 U/L (ref 15–41)
Albumin: 4.5 g/dL (ref 3.5–5.0)
Alkaline Phosphatase: 86 U/L (ref 38–126)
Anion gap: 16 — ABNORMAL HIGH (ref 5–15)
BUN: 25 mg/dL — ABNORMAL HIGH (ref 6–20)
CHLORIDE: 84 mmol/L — AB (ref 101–111)
CO2: 36 mmol/L — AB (ref 22–32)
CREATININE: 1.05 mg/dL — AB (ref 0.44–1.00)
Calcium: 10.8 mg/dL — ABNORMAL HIGH (ref 8.9–10.3)
GFR calc non Af Amer: >60 mL/min (ref >60)
Glucose, Bld: 189 mg/dL — ABNORMAL HIGH (ref 65–99)
POTASSIUM: 2.7 mmol/L — AB (ref 3.5–5.1)
SODIUM: 136 mmol/L (ref 135–145)
Total Bilirubin: 0.8 mg/dL (ref 0.3–1.2)
Total Protein: 8.1 g/dL (ref 6.5–8.1)

## 2016-10-23 LAB — URINALYSIS, ROUTINE W REFLEX MICROSCOPIC
Bilirubin Urine: NEGATIVE
GLUCOSE, UA: NEGATIVE mg/dL
KETONES UR: 5 mg/dL — AB
LEUKOCYTES UA: NEGATIVE
NITRITE: NEGATIVE
PH: 9 — AB (ref 5.0–8.0)
Protein, ur: 30 mg/dL — AB
Specific Gravity, Urine: 1.015 (ref 1.005–1.030)

## 2016-10-23 LAB — CBC WITH DIFFERENTIAL/PLATELET
Basophils Absolute: 0 10*3/uL (ref 0.0–0.1)
Basophils Relative: 0 %
EOS ABS: 0 10*3/uL (ref 0.0–0.7)
EOS PCT: 0 %
HCT: 45.3 % (ref 36.0–46.0)
Hemoglobin: 15.6 g/dL — ABNORMAL HIGH (ref 12.0–15.0)
LYMPHS ABS: 1.1 10*3/uL (ref 0.7–4.0)
Lymphocytes Relative: 6 %
MCH: 30.6 pg (ref 26.0–34.0)
MCHC: 34.4 g/dL (ref 30.0–36.0)
MCV: 88.8 fL (ref 78.0–100.0)
Monocytes Absolute: 1.4 10*3/uL — ABNORMAL HIGH (ref 0.1–1.0)
Monocytes Relative: 7 %
Neutro Abs: 16.3 10*3/uL — ABNORMAL HIGH (ref 1.7–7.7)
Neutrophils Relative %: 87 %
PLATELETS: 239 10*3/uL (ref 150–400)
RBC: 5.1 MIL/uL (ref 3.87–5.11)
RDW: 14.1 % (ref 11.5–15.5)
WBC: 18.7 10*3/uL — AB (ref 4.0–10.5)

## 2016-10-23 LAB — PREGNANCY, URINE: PREG TEST UR: NEGATIVE

## 2016-10-23 LAB — POC OCCULT BLOOD, ED: Fecal Occult Bld: NEGATIVE

## 2016-10-23 LAB — LIPASE, BLOOD: Lipase: 30 U/L (ref 11–51)

## 2016-10-23 LAB — POC URINE PREG, ED: Preg Test, Ur: NEGATIVE

## 2016-10-23 MED ORDER — PANTOPRAZOLE SODIUM 40 MG IV SOLR
40.0000 mg | Freq: Once | INTRAVENOUS | Status: AC
Start: 2016-10-23 — End: 2016-10-23
  Administered 2016-10-23: 40 mg via INTRAVENOUS
  Filled 2016-10-23: qty 40

## 2016-10-23 MED ORDER — POTASSIUM CHLORIDE CRYS ER 20 MEQ PO TBCR: 40.0000 meq | EXTENDED_RELEASE_TABLET | Freq: Every day | ORAL | 0 refills | Status: DC

## 2016-10-23 MED ORDER — METOCLOPRAMIDE HCL 10 MG PO TABS: 10.0000 mg | ORAL_TABLET | Freq: Three times a day (TID) | ORAL | 0 refills | Status: DC | PRN

## 2016-10-23 MED ORDER — SODIUM CHLORIDE 0.9 % IV BOLUS (SEPSIS)
1000.0000 mL | Freq: Once | INTRAVENOUS | Status: AC
  Administered 2016-10-23: 1000 mL via INTRAVENOUS

## 2016-10-23 MED ORDER — SODIUM CHLORIDE 0.9 % IV SOLN
1000.0000 mg | Freq: Once | INTRAVENOUS | Status: AC
  Administered 2016-10-23: 1000 mg via INTRAVENOUS
  Filled 2016-10-23: qty 10

## 2016-10-23 MED ORDER — POTASSIUM CHLORIDE 2 MEQ/ML IV SOLN
Freq: Once | INTRAVENOUS | Status: AC
  Administered 2016-10-23: 11:00:00 via INTRAVENOUS
  Filled 2016-10-23 (×2): qty 1000

## 2016-10-23 MED ORDER — ONDANSETRON HCL 4 MG/2ML IJ SOLN
4.0000 mg | Freq: Once | INTRAMUSCULAR | Status: AC
  Administered 2016-10-23: 4 mg via INTRAVENOUS
  Filled 2016-10-23: qty 2

## 2016-10-23 NOTE — ED Notes (Signed)
Pt did not need anything at this time  

## 2016-10-23 NOTE — ED Triage Notes (Signed)
Pt transported [facility] home [facility] from home with c/o seizure like activity this am. EMS states family reports activity lasted @ 30 seconds to 1 minute.  EMS states pt appeared confused but alert on arrival, pt is now alert and oriented. Pt reports she does have sz history and takes Keppra for same.  No tongue trauma or incontinence reported.  #20 IV est L AC Pt report feels her potassium may be low at this time, pt report multiple episodes of emesis beginning 24 hours ago, pt did have a few episodes of diarrhea yesterday morning,  Pt c/o mid abd pain at this time. Pt states she has not been able to take medication yesterday or today due to emesis

## 2016-10-23 NOTE — ED Notes (Signed)
Patient in imaging

## 2016-10-23 NOTE — ED Notes (Signed)
According to transport, they found patient in CT, naked and she had voided on herself and they stated that she didn't remember anything. She appears lethargic and sleepy with me. Able to wake up and answer all questions but denies anything happening in CT. Pupils are brisk, equal and reactive and vitals WDL

## 2016-10-23 NOTE — ED Notes (Signed)
Papers reviewed with patient. Paper scrubs given, and IV removed. Patient denies pain and called family to come take her home

## 2016-10-23 NOTE — ED Notes (Signed)
Patient transported to Ultrasound 

## 2016-10-23 NOTE — ED Provider Notes (Signed)
[facility]-EMERGENCY DEPT Provider Note   CSN: [id number] Arrival date & time: 10/23/16  4098     History   Chief Complaint Chief Complaint  Patient presents with  . Seizures    HPI Danielle Washington is a 41 y.o. female.  HPI Patient is not taking her medications including Keppra for the last few days due to multiple episodes of vomiting. She has accompanying right-sided abdominal pain. She states this is chronic in nature. One episode of loose stool yesterday. States the stool was dark in color. No gross blood in vomit or stool. History of seizures. States she's been unable to follow up with her neurologist due to not having insurance. She had witnessed seizure this morning. Per family patient became stiff and had convulsing movements. This lasted roughly 1 minute. She may have had a second episode. Was confused afterwards and has gradually come back to her baseline mental status. She complains only of abdominal pain at this point. Denies any urinary or vaginal symptoms. She is having some cramping in her hands and thinks her potassium may be low. Denies any focal weakness or numbness. Past Medical History:  Diagnosis Date  . Anxiety   . Asthma   . GERD (gastroesophageal reflux disease)   . Hypertension   . Restless leg syndrome   . Seizures (HCC)    09/04/2015. Unknown etiology and on Keppra  . Stroke [facility])    x3 in September 2016. Numbness and tingling of hands is only deficit.    Patient Active Problem List   Diagnosis Date Noted  . 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 GI bleed 09/06/2015  . 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 Hypertension 09/05/2015  . Asthma 09/05/2015  . Aspiration into respiratory tract 09/05/2015  . 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 Agitation   . Emesis   . Nausea and vomiting 05/23/2015  . Acute ischemic stroke (HCC) 05/23/2015  . Substance abuse 05/23/2015  . Tobacco abuse 05/23/2015  .  GERD (gastroesophageal reflux disease) 05/23/2015  . Stroke (cerebrum) (HCC) 05/23/2015  . Stroke of unknown etiology (HCC)   . Nausea with vomiting     Past Surgical History:  Procedure Laterality Date  . LAPAROSCOPIC BILATERAL SALPINGECTOMY Bilateral 01/06/2016   Procedure: LAPAROSCOPIC BILATERAL SALPINGECTOMY FOR STERILIZATION;  Surgeon: Lazaro Arms, MD;  Location: [facility];  Service: Gynecology;  Laterality: Bilateral;  . NO PAST SURGERIES    . TEE WITHOUT CARDIOVERSION N/A 05/26/2015   Procedure: TRANSESOPHAGEAL ECHOCARDIOGRAM (TEE);  Surgeon: Quintella Reichert, MD;  Location: [facility] ENDOSCOPY;  Service: Cardiovascular;  Laterality: N/A;    OB History    No data available       Home Medications    Prior to Admission medications   Medication Sig Start Date End Date Taking? Authorizing Provider  aspirin EC 81 MG tablet Take 81 mg by mouth daily.   Yes Historical Provider, MD  cloNIDine (CATAPRES) 0.1 MG tablet Take 1 tablet (0.1 mg total) by mouth 2 (two) times daily. Patient taking differently: Take 0.1 mg by mouth 2 (two) times daily as needed (hypertension).  09/09/15  Yes Ripudeep Jenna Luo, MD  esomeprazole (NEXIUM) 40 MG capsule Take 1 capsule (40 mg total) by mouth 2 (two) times daily before a meal. 09/09/15  Yes Ripudeep K Rai, MD  famotidine (PEPCID) 20 MG tablet Take 1 tablet (20 mg total) by mouth 2 (two) times daily as needed for heartburn. 09/09/15  Yes Ripudeep Jenna Luo, MD  levETIRAcetam (KEPPRA) 500 MG tablet Take 1 tablet (500 mg total) by mouth 2 (two) times daily. 01/26/16  Yes Micki Riley, MD  ondansetron (ZOFRAN) 8 MG tablet Take 1 tablet (8 mg total) by mouth every 8 (eight) hours as needed for nausea. 01/06/16  Yes Lazaro Arms, MD  sucralfate (CARAFATE) 1 g tablet Take 1 tablet (1 g total) by mouth 4 (four) times daily -  with meals and at bedtime. 01/13/16  Yes Lazaro Arms, MD  metoCLOPramide (REGLAN) 10 MG tablet Take 1 tablet (10 mg total) by mouth every 8 (eight) hours  as needed for nausea or vomiting. 10/23/16   Loren Racer, MD  potassium chloride SA (K-DUR,KLOR-CON) 20 MEQ tablet Take 2 tablets (40 mEq total) by mouth daily. 10/23/16 10/26/16  Loren Racer, MD    Family History Family History  Problem Relation Age of Onset  . Lung cancer Mother   . Heart disease Mother   . COPD Mother   . Stroke Mother   . Heart attack Father   . Hypertension Father   . Stroke Maternal Grandmother   . Diabetes Maternal Grandmother     Social History Social History  Substance Use Topics  . Smoking status: Current Every Day Smoker    Packs/day: 1.00    Years: 20.00    Types: Cigarettes  . Smokeless tobacco: Never Used  . Alcohol use 1.2 oz/week    1 Glasses of wine, 1 Shots of liquor per week     Comment: occasionally     Allergies   Patient has no known allergies.   Review of Systems Review of Systems  Constitutional: Negative for chills and fever.  HENT: Negative for facial swelling.   Respiratory: Negative for shortness of breath.   Cardiovascular: Negative for chest pain.  Gastrointestinal: Positive for abdominal pain, diarrhea, nausea and vomiting. Negative for blood in stool.  Genitourinary: Negative for dysuria and flank pain.  Musculoskeletal: Positive for back pain. Negative for neck pain and neck stiffness.  Skin: Negative for rash and wound.  Neurological: Positive for seizures and syncope. Negative for dizziness, speech difficulty, weakness, light-headedness and headaches.  Psychiatric/Behavioral: Positive for confusion.  All other systems reviewed and are negative.    Physical Exam Updated Vital Signs BP 164/100   Pulse 85   Temp 99.1 F (37.3 C) (Oral)   Resp 19   Ht 5\' 2"  (1.575 m)   Wt 148 lb (67.1 kg)   SpO2 95%   BMI 27.07 kg/m   Physical Exam  Constitutional: She is oriented to person, place, and time. She appears well-developed and well-nourished. No distress.  HENT:  Head: Normocephalic and atraumatic.    Mouth/Throat: Oropharynx is clear and moist.  Edentulous  Eyes: EOM are normal. Pupils are equal, round, and reactive to light.  Neck: Normal range of motion. Neck supple.  No posterior midline cervical tenderness to palpation. No meningismus.  Cardiovascular: Normal rate, regular rhythm and intact distal pulses.   Murmur heard. Pulmonary/Chest: Effort normal and breath sounds normal. No respiratory distress. She has no wheezes. She has no rales. She exhibits no tenderness.  Abdominal: Soft. Bowel sounds are normal. There is tenderness (diffuse abdominal tenderness which appears to be most pronounced in the right upper quadrant. No rebound or guarding.). There is no rebound and no guarding.  Genitourinary: Rectal exam shows guaiac negative stool.  Genitourinary Comments: Rectal exam with brown stool.  Musculoskeletal: Normal range of motion. She exhibits tenderness. She exhibits no  edema.  Diffuse bilateral lumbar tenderness most pronounced on the right. No definite CVA tenderness. No midline thoracic or lumbar tenderness. Distal pulses are 2+.  Neurological: She is alert and oriented to person, place, and time.  Patient is alert and oriented x3 with clear, goal oriented speech. Patient has 5/5 motor in all extremities. Sensation is intact to light touch. Bilateral finger-to-nose is normal with no signs of dysmetria.  Skin: Skin is warm and dry. Capillary refill takes less than 2 seconds. No rash noted. No erythema.  Psychiatric: She has a normal mood and affect. Her behavior is normal.  Nursing note and vitals reviewed.    ED Treatments / Results  Labs (all labs ordered are listed, but only abnormal results are displayed) Labs Reviewed  CBC WITH DIFFERENTIAL/PLATELET - Abnormal; Notable for the following:       Result Value   WBC 18.7 (*)    Hemoglobin 15.6 (*)    Neutro Abs 16.3 (*)    Monocytes Absolute 1.4 (*)    All other components within normal limits  COMPREHENSIVE METABOLIC  PANEL - Abnormal; Notable for the following:    Potassium 2.7 (*)    Chloride 84 (*)    CO2 36 (*)    Glucose, Bld 189 (*)    BUN 25 (*)    Creatinine, Ser 1.05 (*)    Calcium 10.8 (*)    Anion gap 16 (*)    All other components within normal limits  URINALYSIS, ROUTINE W REFLEX MICROSCOPIC - Abnormal; Notable for the following:    APPearance HAZY (*)    pH 9.0 (*)    Hgb urine dipstick SMALL (*)    Ketones, ur 5 (*)    Protein, ur 30 (*)    Bacteria, UA RARE (*)    Squamous Epithelial / LPF 0-5 (*)    All other components within normal limits  LIPASE, BLOOD  PREGNANCY, URINE  OCCULT BLOOD X 1 CARD TO LAB, STOOL  POC URINE PREG, ED  POC OCCULT BLOOD, ED    EKG  EKG Interpretation  Date/Time:  Sunday October 23 2016 07:04:00 EST Ventricular Rate:  87 PR Interval:    QRS Duration: 70 QT Interval:  474 QTC Calculation: 571 R Axis:   48 Text Interpretation:  Sinus rhythm Probable left atrial enlargement Probable left ventricular hypertrophy Prolonged QT interval Confirmed by Ranae PalmsYELVERTON  MD, Terilynn Buresh ([id number]) on 10/23/2016 7:47:33 AM       Radiology [place] Abdomen Complete  Result Date: 10/23/2016 CLINICAL DATA:  Abdominal pain for 1 and half days. EXAM: ABDOMEN ULTRASOUND COMPLETE COMPARISON:  None. FINDINGS: Gallbladder: Gallbladder wall is 2.0 mm. No sonographic Murphy sign or stones. Common bile duct: Diameter: 2.6 mm Liver: No focal lesion identified. Within normal limits in parenchymal echogenicity. IVC: No abnormality visualized. Pancreas: Not well evaluated because of overlying bowel gas. Spleen: Size and appearance within normal limits. Right Kidney: Length: 10.0 cm. Visualized portion is unremarkable. Lower pole is not well seen because of bowel gas. Left Kidney: Length: 10.6 and in. Echogenicity within normal limits. No mass or hydronephrosis visualized. Abdominal aorta: 1.7 cm Other findings: None. IMPRESSION: No evidence for acute  abnormality. Electronically Signed   By:  Norva PavlovElizabeth  Brown M.D.   On: 10/23/2016 09:39    Procedures Procedures (including critical care time)  Medications Ordered in ED Medications  levETIRAcetam (KEPPRA) 1,000 mg in sodium chloride 0.9 % 100 mL IVPB (0 mg Intravenous Stopped 10/23/16 1017)  sodium chloride 0.9 %  bolus 1,000 mL (0 mLs Intravenous Stopped 10/23/16 1055)  ondansetron (ZOFRAN) injection 4 mg (4 mg Intravenous Given 10/23/16 1002)  pantoprazole (PROTONIX) injection 40 mg (40 mg Intravenous Given 10/23/16 1001)  sodium chloride 0.9 % 1,000 mL with potassium chloride 80 mEq infusion ( Intravenous Given 10/23/16 1055)  sodium chloride 0.9 % bolus 1,000 mL (0 mLs Intravenous Stopped 10/23/16 1450)     Initial Impression / Assessment and Plan / ED Course  I have reviewed the triage vital signs and the nursing notes.  Pertinent labs & imaging results that were available during my care of the patient were reviewed by me and considered in my medical decision making (see chart for details).   patient is resting well in no distress. Giving IV fluids and replacing potassium. Also loaded with Keppra and given patient's PPI.   No further vomiting in the emergency department. The patient maintains a normal mental status. Discharge home with antiemetic and advised to continue medications as previously prescribed. She'll also need follow-up with gastroenterology and neurology as an outpatient. Return precautions been given. Final Clinical Impressions(s) / ED Diagnoses   Final diagnoses:  Seizure (HCC)  Hypokalemia  Dehydration    New Prescriptions New Prescriptions   METOCLOPRAMIDE (REGLAN) 10 MG TABLET    Take 1 tablet (10 mg total) by mouth every 8 (eight) hours as needed for nausea or vomiting.   POTASSIUM CHLORIDE SA (K-DUR,KLOR-CON) 20 MEQ TABLET    Take 2 tablets (40 mEq total) by mouth daily.     Loren Racer, MD 10/23/16 [phone]

## 2016-11-30 ENCOUNTER — Other Ambulatory Visit: Payer: Self-pay | Admitting: Neurology

## 2017-01-11 ENCOUNTER — Other Ambulatory Visit: Payer: Self-pay

## 2017-01-11 ENCOUNTER — Telehealth: Payer: Self-pay | Admitting: Neurology

## 2017-01-11 MED ORDER — LEVETIRACETAM 500 MG PO TABS: ORAL_TABLET | ORAL | 0 refills | Status: DC

## 2017-01-11 NOTE — Telephone Encounter (Signed)
Rn call patient that she was last seen 01/2016 for seizures. Rn stated she has to schedule an appt to get refills. Pt stated she does not have insurance and is trying to get medicaid. Rn ask can her PCP prescribed her medications. Pt stated per Dr. Pearlean BrownieSethi he told her to call in for refills. Rn stated she cancel appt last year and never reschedule. [facility]. PT stated she did not want to come in to just get refills. Rn stated only a 30 day refill can be done. Pt did not schedule appt. Rn advised pt to seek [facility] to get help with office visits. Pt verbalized understanding.

## 2017-01-11 NOTE — Telephone Encounter (Signed)
Pt called answer service 01/10/17 @ 5:24 pm  requesting refill of Keppra 500 mg.  Best call back is [phone]

## 2017-01-11 NOTE — Telephone Encounter (Signed)
Note    Rn call patient that she was last seen 01/2016 for seizures. Rn stated she has to schedule an appt to get refills. Pt stated she does not have insurance and is trying to get medicaid. Rn ask can her PCP prescribed her medications. Pt stated per Dr. Pearlean BrownieSethi he told her to call in for refills. Rn stated she cancel appt last year and never reschedule. R[facility] and [facility] assistance. PT stated she did not want to come in to just get refills. Rn stated only a 30 day refill can be done. Pt did not schedule appt. Rn advised pt to seek [facility] to get help with office visits. Pt verbalized understanding.

## 2017-01-23 NOTE — Telephone Encounter (Signed)
Rn tried to call patient about her future refills on keppra. Rn call patients number, and after saying hello three times, the phone hung up. Rn spoke with Dr. Pearlean BrownieSethi today in the office concerning pt was given 30 day refills only. Rn told Dr.Sethi that pt stated she had no insurance to come in. Rn recommend pt seek [facility] help [facility] app[facility]. Per Dr. Pearlean BrownieSethi 30 day refill was approve on 5/.23/2018. Per Dr. Pearlean BrownieSethi he cannot refill anymore keppra until she is seen in the office by [facility] MD or NP. PT will be sent a letter regarding this issue.

## 2017-01-24 NOTE — Telephone Encounter (Signed)
Patient sent a letter by Dr. Pearlean BrownieSethi.Pt needs to schedule an appt and come to continue refills. Last refill was 01/11/2017.Pt cancel appt with Harriett SineNancy NP last year. Pt did not reschedule.

## 2017-12-25 ENCOUNTER — Other Ambulatory Visit: Payer: Self-pay | Admitting: Neurology

## 2017-12-26 ENCOUNTER — Ambulatory Visit: Payer: Self-pay | Admitting: Neurology

## 2017-12-27 ENCOUNTER — Encounter: Payer: Self-pay | Admitting: Neurology

## 2017-12-27 ENCOUNTER — Telehealth: Payer: Self-pay

## 2017-12-27 NOTE — Telephone Encounter (Signed)
Patient no show for appt 12/26/2017. Last seen 2017.

## 2019-02-03 ENCOUNTER — Other Ambulatory Visit: Payer: Self-pay

## 2019-02-03 ENCOUNTER — Observation Stay (HOSPITAL_COMMUNITY)
Admission: EM | Admit: 2019-02-03 | Discharge: 2019-02-04 | Disposition: A | Discharge status: Home / Self Care | Payer: Self-pay | Attending: Internal Medicine | Admitting: Internal Medicine

## 2019-02-03 ENCOUNTER — Emergency Department (HOSPITAL_COMMUNITY): Payer: Self-pay

## 2019-02-03 ENCOUNTER — Encounter (HOSPITAL_COMMUNITY): Payer: Self-pay | Admitting: Emergency Medicine

## 2019-02-03 DIAGNOSIS — I10 Essential (primary) hypertension: Secondary | ICD-10-CM | POA: Insufficient documentation

## 2019-02-03 DIAGNOSIS — E876 Hypokalemia: Principal | ICD-10-CM | POA: Diagnosis present

## 2019-02-03 DIAGNOSIS — Z79899 Other long term (current) drug therapy: Secondary | ICD-10-CM | POA: Insufficient documentation

## 2019-02-03 DIAGNOSIS — Z8673 Personal history of transient ischemic attack (TIA), and cerebral infarction without residual deficits: Secondary | ICD-10-CM | POA: Insufficient documentation

## 2019-02-03 DIAGNOSIS — F17210 Nicotine dependence, cigarettes, uncomplicated: Secondary | ICD-10-CM | POA: Insufficient documentation

## 2019-02-03 DIAGNOSIS — Z7982 Long term (current) use of aspirin: Secondary | ICD-10-CM | POA: Insufficient documentation

## 2019-02-03 DIAGNOSIS — J45909 Unspecified asthma, uncomplicated: Secondary | ICD-10-CM | POA: Insufficient documentation

## 2019-02-03 DIAGNOSIS — N3000 Acute cystitis without hematuria: Secondary | ICD-10-CM | POA: Insufficient documentation

## 2019-02-03 DIAGNOSIS — R112 Nausea with vomiting, unspecified: Secondary | ICD-10-CM | POA: Insufficient documentation

## 2019-02-03 DIAGNOSIS — Z20828 Contact with and (suspected) exposure to other viral communicable diseases: Secondary | ICD-10-CM | POA: Insufficient documentation

## 2019-02-03 HISTORY — DX: Hypokalemia: E87.6

## 2019-02-03 LAB — CBC WITH DIFFERENTIAL/PLATELET
Abs Immature Granulocytes: 0.03 10*3/uL (ref 0.00–0.07)
Basophils Absolute: 0 10*3/uL (ref 0.0–0.1)
Basophils Relative: 0 %
Eosinophils Absolute: 0.1 10*3/uL (ref 0.0–0.5)
Eosinophils Relative: 1 %
HCT: 43.5 % (ref 36.0–46.0)
Hemoglobin: 15.2 g/dL — ABNORMAL HIGH (ref 12.0–15.0)
Immature Granulocytes: 0 %
Lymphocytes Relative: 15 %
Lymphs Abs: 1.2 10*3/uL (ref 0.7–4.0)
MCH: 32.8 pg (ref 26.0–34.0)
MCHC: 34.9 g/dL (ref 30.0–36.0)
MCV: 94 fL (ref 80.0–100.0)
Monocytes Absolute: 0.7 10*3/uL (ref 0.1–1.0)
Monocytes Relative: 8 %
Neutro Abs: 6.4 10*3/uL (ref 1.7–7.7)
Neutrophils Relative %: 76 %
Platelets: 216 10*3/uL (ref 150–400)
RBC: 4.63 MIL/uL (ref 3.87–5.11)
RDW: 12.3 % (ref 11.5–15.5)
WBC: 8.4 10*3/uL (ref 4.0–10.5)
nRBC: 0 % (ref 0.0–0.2)

## 2019-02-03 LAB — COMPREHENSIVE METABOLIC PANEL
ALT: 17 U/L (ref 0–44)
AST: 17 U/L (ref 15–41)
Albumin: 4.4 g/dL (ref 3.5–5.0)
Alkaline Phosphatase: 62 U/L (ref 38–126)
Anion gap: 20 — ABNORMAL HIGH (ref 5–15)
BUN: 23 mg/dL — ABNORMAL HIGH (ref 6–20)
CO2: 32 mmol/L (ref 22–32)
Calcium: 9.7 mg/dL (ref 8.9–10.3)
Chloride: 79 mmol/L — ABNORMAL LOW (ref 98–111)
Creatinine, Ser: 0.7 mg/dL (ref 0.44–1.00)
GFR calc Af Amer: >60 mL/min (ref >60)
GFR calc non Af Amer: >60 mL/min (ref >60)
Glucose, Bld: 173 mg/dL — ABNORMAL HIGH (ref 70–99)
Potassium: 2.2 mmol/L — CL (ref 3.5–5.1)
Sodium: 131 mmol/L — ABNORMAL LOW (ref 135–145)
Total Bilirubin: 0.5 mg/dL (ref 0.3–1.2)
Total Protein: 7.8 g/dL (ref 6.5–8.1)

## 2019-02-03 LAB — HEMOGLOBIN AND HEMATOCRIT, BLOOD
HCT: 40 % (ref 36.0–46.0)
Hemoglobin: 13.3 g/dL (ref 12.0–15.0)

## 2019-02-03 LAB — URINALYSIS, ROUTINE W REFLEX MICROSCOPIC
Bilirubin Urine: NEGATIVE
Glucose, UA: NEGATIVE mg/dL
Hgb urine dipstick: NEGATIVE
Ketones, ur: 5 mg/dL — AB
Nitrite: POSITIVE — AB
Protein, ur: NEGATIVE mg/dL
Specific Gravity, Urine: 1.016 (ref 1.005–1.030)
pH: 7 (ref 5.0–8.0)

## 2019-02-03 LAB — LIPASE, BLOOD: Lipase: 27 U/L (ref 11–51)

## 2019-02-03 LAB — MAGNESIUM: Magnesium: 1.9 mg/dL (ref 1.7–2.4)

## 2019-02-03 LAB — POTASSIUM: Potassium: 3.2 mmol/L — ABNORMAL LOW (ref 3.5–5.1)

## 2019-02-03 MED ORDER — SODIUM CHLORIDE 0.9 % IV SOLN: 1.0000 g | INTRAVENOUS | Status: DC

## 2019-02-03 MED ORDER — POTASSIUM CHLORIDE IN NACL 40-0.9 MEQ/L-% IV SOLN
INTRAVENOUS | Status: DC
  Administered 2019-02-03 – 2019-02-04 (×2): 100 mL/h via INTRAVENOUS

## 2019-02-03 MED ORDER — ONDANSETRON HCL 4 MG/2ML IJ SOLN
4.0000 mg | Freq: Once | INTRAMUSCULAR | Status: AC
  Administered 2019-02-03: 12:00:00 4 mg via INTRAVENOUS
  Filled 2019-02-03: qty 2

## 2019-02-03 MED ORDER — MORPHINE SULFATE (PF) 2 MG/ML IV SOLN: 2.0000 mg | INTRAVENOUS | Status: DC | PRN

## 2019-02-03 MED ORDER — LEVETIRACETAM IN NACL 500 MG/100ML IV SOLN
500.0000 mg | Freq: Two times a day (BID) | INTRAVENOUS | Status: DC
  Administered 2019-02-03: 500 mg via INTRAVENOUS
  Filled 2019-02-03: qty 100

## 2019-02-03 MED ORDER — LEVETIRACETAM 500 MG PO TABS
500.0000 mg | ORAL_TABLET | Freq: Once | ORAL | Status: AC
  Administered 2019-02-03: 500 mg via ORAL
  Filled 2019-02-03: qty 1

## 2019-02-03 MED ORDER — POTASSIUM CHLORIDE 10 MEQ/100ML IV SOLN
10.0000 meq | Freq: Once | INTRAVENOUS | Status: AC
  Administered 2019-02-03: 10 meq via INTRAVENOUS
  Filled 2019-02-03: qty 100

## 2019-02-03 MED ORDER — ENOXAPARIN SODIUM 40 MG/0.4ML ~~LOC~~ SOLN
40.0000 mg | SUBCUTANEOUS | Status: DC
  Administered 2019-02-03: 40 mg via SUBCUTANEOUS
  Filled 2019-02-03: qty 0.4

## 2019-02-03 MED ORDER — PROMETHAZINE HCL 25 MG/ML IJ SOLN: 12.5000 mg | Freq: Four times a day (QID) | INTRAMUSCULAR | Status: DC | PRN

## 2019-02-03 MED ORDER — SODIUM CHLORIDE 0.9 % IV BOLUS
1000.0000 mL | Freq: Once | INTRAVENOUS | Status: AC
  Administered 2019-02-03: 12:00:00 1000 mL via INTRAVENOUS

## 2019-02-03 MED ORDER — ONDANSETRON HCL 4 MG/2ML IJ SOLN: 4.0000 mg | Freq: Four times a day (QID) | INTRAMUSCULAR | Status: DC | PRN

## 2019-02-03 MED ORDER — POTASSIUM CHLORIDE 10 MEQ/100ML IV SOLN
10.0000 meq | Freq: Once | INTRAVENOUS | Status: AC
  Administered 2019-02-03: 13:00:00 10 meq via INTRAVENOUS
  Filled 2019-02-03: qty 100

## 2019-02-03 MED ORDER — ACETAMINOPHEN 325 MG PO TABS: 650.0000 mg | ORAL_TABLET | Freq: Four times a day (QID) | ORAL | Status: DC | PRN

## 2019-02-03 MED ORDER — PANTOPRAZOLE SODIUM 40 MG IV SOLR
40.0000 mg | Freq: Once | INTRAVENOUS | Status: AC
  Administered 2019-02-03: 12:00:00 40 mg via INTRAVENOUS
  Filled 2019-02-03: qty 40

## 2019-02-03 MED ORDER — POTASSIUM CHLORIDE CRYS ER 20 MEQ PO TBCR
40.0000 meq | EXTENDED_RELEASE_TABLET | ORAL | Status: AC
  Administered 2019-02-03 – 2019-02-04 (×3): 40 meq via ORAL
  Filled 2019-02-03 (×3): qty 2

## 2019-02-03 MED ORDER — ACETAMINOPHEN 650 MG RE SUPP: 650.0000 mg | Freq: Four times a day (QID) | RECTAL | Status: DC | PRN

## 2019-02-03 MED ORDER — HYDROMORPHONE HCL 1 MG/ML IJ SOLN
1.0000 mg | Freq: Once | INTRAMUSCULAR | Status: AC
  Administered 2019-02-03: 1 mg via INTRAVENOUS
  Filled 2019-02-03: qty 1

## 2019-02-03 MED ORDER — MORPHINE SULFATE (PF) 4 MG/ML IV SOLN
4.0000 mg | Freq: Once | INTRAVENOUS | Status: AC
  Administered 2019-02-03: 4 mg via INTRAVENOUS
  Filled 2019-02-03: qty 1

## 2019-02-03 MED ORDER — SODIUM CHLORIDE 0.9 % IV SOLN
1.0000 g | Freq: Once | INTRAVENOUS | Status: AC
  Administered 2019-02-03: 16:00:00 1 g via INTRAVENOUS
  Filled 2019-02-03: qty 10

## 2019-02-03 MED ORDER — HYDRALAZINE HCL 20 MG/ML IJ SOLN: 10.0000 mg | INTRAMUSCULAR | Status: DC | PRN

## 2019-02-03 MED ORDER — MAGNESIUM SULFATE 2 GM/50ML IV SOLN
2.0000 g | Freq: Once | INTRAVENOUS | Status: AC
  Administered 2019-02-03: 2 g via INTRAVENOUS
  Filled 2019-02-03: qty 50

## 2019-02-03 MED ORDER — PANTOPRAZOLE SODIUM 40 MG IV SOLR: 40.0000 mg | INTRAVENOUS | Status: DC

## 2019-02-03 MED ORDER — ONDANSETRON HCL 4 MG PO TABS: 4.0000 mg | ORAL_TABLET | Freq: Four times a day (QID) | ORAL | Status: DC | PRN

## 2019-02-03 NOTE — ED Provider Notes (Signed)
The patient is a 43 year old female who presents with a complaint of nausea vomiting and mid abdominal discomfort.  She states this has been going on since Tuesday.  Labs show no significant leukocytosis, no elevation in the lipase, liver function and renal function seems to be baseline however she is severely hypokalemic and though she has been hypokalemic over time, today it is 2.2 and she still remains nauseated.  On my exam she does have a very soft abdomen with mild to moderate periumbilical and epigastric tenderness without guarding.  Will likely need admission for ongoing potassium supplementation and symptomatic control of her symptoms.  Patient agreeable   EKG Interpretation  Date/Time:  Sunday February 03 2019 12:39:51 EDT Ventricular Rate:  73 PR Interval:    QRS Duration: 72 QT Interval:  456 QTC Calculation: 503 R Axis:   49 Text Interpretation:  Sinus rhythm Right atrial enlargement Abnormal R-wave progression, early transition Probable LVH with secondary repol abnrm Borderline prolonged QT interval since last tracing no significant change Confirmed by Noemi Chapel [phone]) on 02/03/2019 12:48:08 PM      Medical screening examination/treatment/procedure(s) were conducted as a shared visit with non-physician practitioner(s) and myself.  I personally evaluated the patient during the encounter.  Clinical Impression:   Final diagnoses:  Hypokalemia  Non-intractable vomiting with nausea, unspecified vomiting type  Acute cystitis without hematuria         Noemi Chapel, MD 02/04/19 [phone]

## 2019-02-03 NOTE — ED Provider Notes (Signed)
[facility] EMERGENCY DEPARTMENT Provider Note   CSN: [id number] Arrival date & time: 02/03/19  1108    History   Chief Complaint Chief Complaint  Patient presents with  . Abdominal Pain    HPI Perry MountRenee Taunton is a 43 y.o. female with a history of asthma, HTN, GERD, seizure disorder, hypokalemia and h/o cva presenting with a 5 day history of upper abdominal pain with nausea and vomiting, describing an intense burning sensation without alleviators.  She denies fevers or chills, no hematemesis or diarrhea, last bm this am which did not relieve her sx.  She has been able to keep her home medicines down except for this morning including missing her dose of keppra.  She reports prior episodes of similar symptoms with resultant hypokalemia.  She had some dysuria last week which has resolved.      The history is provided by the patient.    Past Medical History:  Diagnosis Date  . Anxiety   . Asthma   . GERD (gastroesophageal reflux disease)   . Hypertension   . Hypokalemia   . Restless leg syndrome   . Seizures (HCC)    09/04/2015. Unknown etiology and on Keppra  . Stroke [facility](HCC)    x3 in September 2016. Numbness and tingling of hands is only deficit.    Patient Active Problem List   Diagnosis Date Noted  . 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 GI bleed 09/06/2015  . 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 Hypertension 09/05/2015  . Asthma 09/05/2015  . Aspiration into respiratory tract 09/05/2015  . 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 Agitation   . Emesis   . Nausea and vomiting 05/23/2015  . Acute ischemic stroke (HCC) 05/23/2015  . Substance abuse (HCC) 05/23/2015  . Tobacco abuse 05/23/2015  . GERD (gastroesophageal reflux disease) 05/23/2015  . Stroke (cerebrum) (HCC) 05/23/2015  . Stroke of unknown etiology (HCC)   . Nausea with vomiting     Past Surgical History:  Procedure Laterality Date  . LAPAROSCOPIC  BILATERAL SALPINGECTOMY Bilateral 01/06/2016   Procedure: LAPAROSCOPIC BILATERAL SALPINGECTOMY FOR STERILIZATION;  Surgeon: Lazaro ArmsLuther H Eure, MD;  Location: [facility];  Service: Gynecology;  Laterality: Bilateral;  . NO PAST SURGERIES    . PARTIAL HYSTERECTOMY    . TEE WITHOUT CARDIOVERSION N/A 05/26/2015   Procedure: TRANSESOPHAGEAL ECHOCARDIOGRAM (TEE);  Surgeon: Quintella Reichertraci R Turner, MD;  [facility];  Service: Cardiovascular;  Laterality: N/A;     OB History    Gravida  2   Para  2   Term  2   Preterm      AB      Living  2     SAB      TAB      Ectopic      Multiple      Live Births               Home Medications    Prior to Admission medications   Medication Sig Start Date End Date Taking? Authorizing Provider  aspirin EC 81 MG tablet Take 81 mg by mouth daily.   Yes [provider]  levETIRAcetam (KEPPRA) 500 MG tablet TAKE  (1)  TABLET TWICE A DAY. 01/11/17  Yes Micki RileySethi, Pramod S, MD  omeprazole (PRILOSEC) 20 MG capsule Take 20 mg by mouth daily.   Yes [provider]    Family History Family History  Problem Relation Age of Onset  . Lung cancer  Mother   . Heart disease Mother   . COPD Mother   . Stroke Mother   . Heart attack Father   . Hypertension Father   . Stroke Maternal Grandmother   . Diabetes Maternal Grandmother     Social History Social History   Tobacco Use  . Smoking status: Current Every Day Smoker    Packs/day: 1.00    Years: 20.00    Pack years: 20.00    Types: Cigarettes  . Smokeless tobacco: Never Used  Substance Use Topics  . Alcohol use: Yes    Alcohol/week: 2.0 standard drinks    Types: 1 Glasses of wine, 1 Shots of liquor per week    Comment: occasionally  . Drug use: No     Allergies   Patient has no known allergies.   Review of Systems Review of Systems  Constitutional: Negative for chills and fever.  HENT: Negative for congestion and sore throat.   Eyes: Negative.   Respiratory:  Negative for chest tightness and shortness of breath.   Cardiovascular: Negative for chest pain.  Gastrointestinal: Positive for abdominal pain, nausea and vomiting. Negative for diarrhea.  Genitourinary: Negative.   Musculoskeletal: Negative for arthralgias, joint swelling and neck pain.  Skin: Negative.  Negative for rash and wound.  Neurological: Negative for dizziness, weakness, light-headedness, numbness and headaches.  Psychiatric/Behavioral: Negative.      Physical Exam Updated Vital Signs BP (!) 165/97   Pulse 84   Temp 97.9 F (36.6 C) (Oral)   Resp 20   Ht 5' 1.5" (1.562 m)   Wt 64.4 kg   SpO2 99%   BMI 26.40 kg/m   Physical Exam Vitals signs and nursing note reviewed.  Constitutional:      Appearance: She is well-developed.  HENT:     Head: Normocephalic and atraumatic.  Eyes:     Conjunctiva/sclera: Conjunctivae normal.  Neck:     Musculoskeletal: Normal range of motion.  Cardiovascular:     Rate and Rhythm: Normal rate and regular rhythm.     Heart sounds: Normal heart sounds.  Pulmonary:     Effort: Pulmonary effort is normal.     Breath sounds: Normal breath sounds. No wheezing.  Abdominal:     General: Bowel sounds are normal.     Palpations: Abdomen is soft.     Tenderness: There is abdominal tenderness in the right upper quadrant and epigastric area. There is no guarding or rebound.  Musculoskeletal: Normal range of motion.  Skin:    General: Skin is warm and dry.  Neurological:     Mental Status: She is alert.      ED Treatments / Results  Labs (all labs ordered are listed, but only abnormal results are displayed) Labs Reviewed  COMPREHENSIVE METABOLIC PANEL - Abnormal; Notable for the following components:      Result Value   Sodium 131 (*)    Potassium 2.2 (*)    Chloride 79 (*)    Glucose, Bld 173 (*)    BUN 23 (*)    Anion gap 20 (*)    All other components within normal limits  CBC WITH DIFFERENTIAL/PLATELET - Abnormal; Notable  for the following components:   Hemoglobin 15.2 (*)    All other components within normal limits  URINALYSIS, ROUTINE W REFLEX MICROSCOPIC - Abnormal; Notable for the following components:   APPearance HAZY (*)    Ketones, ur 5 (*)    Nitrite POSITIVE (*)    Leukocytes,Ua TRACE (*)  Bacteria, UA FEW (*)    All other components within normal limits  NOVEL CORONAVIRUS, NAA (HOSPITAL ORDER, SEND-OUT TO REF LAB)  LIPASE, BLOOD    EKG EKG Interpretation  Date/Time:  Sunday February 03 2019 12:39:51 EDT Ventricular Rate:  73 PR Interval:    QRS Duration: 72 QT Interval:  456 QTC Calculation: 503 R Axis:   49 Text Interpretation:  Sinus rhythm Right atrial enlargement Abnormal R-wave progression, early transition Probable LVH with secondary repol abnrm Borderline prolonged QT interval since last tracing no significant change Confirmed by Eber HongMiller, Brian ([id number]) on 02/03/2019 12:48:08 PM   Radiology Dg Abd Acute 2+v W 1v Chest  Result Date: 02/03/2019 CLINICAL DATA:  Abdominal pain, nausea, and vomiting since Tuesday, assess bowel-gas pattern EXAM: DG ABDOMEN ACUTE W/ 1V CHEST COMPARISON:  Chest radiograph 09/07/2015 FINDINGS: Normal heart size, mediastinal contours, and pulmonary vascularity. Lungs clear. Azygos fissure incidentally noted. No pleural effusion or pneumothorax. Normal bowel gas pattern. No bowel dilatation, bowel wall thickening, or free air. Levoconvex lumbar scoliosis with facet degenerative changes lower lumbar spine. No acute osseous findings or definite urinary tract calcification. IMPRESSION: No acute abnormalities. Electronically Signed   By: Ulyses SouthwardMark  Boles M.D.   On: 02/03/2019 14:55    Procedures Procedures (including critical care time)  Medications Ordered in ED Medications  cefTRIAXone (ROCEPHIN) 1 g in sodium chloride 0.9 % 100 mL IVPB (has no administration in time range)  ondansetron (ZOFRAN) injection 4 mg (4 mg Intravenous Given 02/03/19 1150)  morphine 4 MG/ML  injection 4 mg (4 mg Intravenous Given 02/03/19 1151)  pantoprazole (PROTONIX) injection 40 mg (40 mg Intravenous Given 02/03/19 1151)  sodium chloride 0.9 % bolus 1,000 mL (0 mLs Intravenous Stopped 02/03/19 1342)  potassium chloride 10 mEq in 100 mL IVPB (0 mEq Intravenous Stopped 02/03/19 1508)  magnesium sulfate IVPB 2 g 50 mL (0 g Intravenous Stopped 02/03/19 1354)  potassium chloride 10 mEq in 100 mL IVPB (0 mEq Intravenous Stopped 02/03/19 1354)  HYDROmorphone (DILAUDID) injection 1 mg (1 mg Intravenous Given 02/03/19 1246)  HYDROmorphone (DILAUDID) injection 1 mg (1 mg Intravenous Given 02/03/19 1427)     Initial Impression / Assessment and Plan / ED Course  I have reviewed the triage vital signs and the nursing notes.  Pertinent labs & imaging results that were available during my care of the patient were reviewed by me and considered in my medical decision making (see chart for details).        Pt with nausea, vomiting and profound hypokalemia, uti without flank pain or fevers/chills.  She was given potassium per IV, 10 meq x 2, also zofran and morphine then dilaudid for abd pain control.  Abd soft, no guarding, no cva tenderness. Non acute abd. Pt also seen by Dr Hyacinth MeekerMiller during ed visit. She was started on rocephin IV with UA findings.  No cva tenderness, normal wbc count, doubt pyelonephritis.  She was also given po dose of keppra as she missed this am's dose.  She will require admission for potassium repletion (also given Mg here).  Ekg stable.  Discussed with Dr Mariea ClontsEmokpae who will see and admit pt.  Final Clinical Impressions(s) / ED Diagnoses   Final diagnoses:  Hypokalemia  Non-intractable vomiting with nausea, unspecified vomiting type  Acute cystitis without hematuria    ED Discharge Orders    None       Victoriano Laindol, Argel Pablo, PA-C 02/03/19 1533    Eber HongMiller, Brian, MD 02/04/19 [phone]

## 2019-02-03 NOTE — ED Notes (Signed)
Date and time results received: 02/03/19 12:22 PM  (use smartphrase ".now" to insert current time)  Test: K+ Critical Value: 2.2  Name of Provider Notified: Sabra Heck  Orders Received? Or Actions Taken?: Orders Received - See Orders for details

## 2019-02-03 NOTE — ED Triage Notes (Signed)
Patient c/o upper abd pain with nausea and vomiting that started Tuesday. Denies any fevers, diarrhea, or active urinary symptoms. Patient does report dysuria 2 weeks ago, now has lower back pain. Patient also states acid reflux with burning in abd, unable to keep acid reflux medication down. Patient has hx of hypokalemia with seizures due to vomiting.

## 2019-02-03 NOTE — H&P (Addendum)
History and Physical    Danielle Washington Weipert WUJ:[id number] DOB: 12-27-1975 DOA: 02/03/2019  [facility]   Patient coming from: Home  I have personally briefly reviewed patient[facility]  Chief Complaint: Vomiting, Abdominal pain  HPI: Danielle Washington Mcenroe is a 43 y.o. female with medical history significant for CVA, seizures, hypertension, asthma, restless leg syndrome and anxiety who presented to the ED with complaints of multiple episodes of vomiting and upper abdominal pain that started 5 days ago.  Patient reports up to 4 episodes of vomiting daily, nonbloody, not coffee-ground appearing.  No loose stools.  No fever or chills.  Feels her stool this morning was black, but no blood in stool.  She tells me she has had H. Pylori in the past and history of multiple stomach ulcers.  Reports compliance with her omeprazole. Patient also reports having pain while voiding about 2 weeks ago.  ED Course: Systolic 140s to 956O190s.  WBC 8.5.  Potassium markedly low at 2.2.  Creatinine above baseline 0.7.  UA trace leukocyte positive nitrites few bacteria and WBCs present.  Acute abdominal x-ray negative for acute abnormality.  Patient was given Dilaudid and morphine for pain, protonixs, IV KCl 10 mEq x 2, 1 L bolus normal saline.  Hospitalist to admit for hypokalemia.  Review of Systems: As per HPI all other systems reviewed and negative.  Past Medical History:  Diagnosis Date  . Anxiety   . Asthma   . GERD (gastroesophageal reflux disease)   . Hypertension   . Hypokalemia   . Restless leg syndrome   . Seizures (HCC)    09/04/2015. Unknown etiology and on Keppra  . Stroke [facility](HCC)    x3 in September 2016. Numbness and tingling of hands is only deficit.    Past Surgical History:  Procedure Laterality Date  . LAPAROSCOPIC BILATERAL SALPINGECTOMY Bilateral 01/06/2016   Procedure: LAPAROSCOPIC BILATERAL SALPINGECTOMY FOR STERILIZATION;  Surgeon: Lazaro ArmsLuther H Eure, MD;   Location: [facility];  Service: Gynecology;  Laterality: Bilateral;  . NO PAST SURGERIES    . PARTIAL HYSTERECTOMY    . TEE WITHOUT CARDIOVERSION N/A 05/26/2015   Procedure: TRANSESOPHAGEAL ECHOCARDIOGRAM (TEE);  Surgeon: Quintella Reichertraci R Turner, MD;  [facility];  Service: Cardiovascular;  Laterality: N/A;     reports that she has been smoking cigarettes. She has a 20.00 pack-year smoking history. She has never used smokeless tobacco. She reports current alcohol use of about 2.0 standard drinks of alcohol per week. She reports that she does not use drugs.  No Known Allergies  Family History  Problem Relation Age of Onset  . Lung cancer Mother   . Heart disease Mother   . COPD Mother   . Stroke Mother   . Heart attack Father   . Hypertension Father   . Stroke Maternal Grandmother   . Diabetes Maternal Grandmother     Prior to Admission medications   Medication Sig Start Date End Date Taking? Authorizing Provider  aspirin EC 81 MG tablet Take 81 mg by mouth daily.   Yes [provider]  levETIRAcetam (KEPPRA) 500 MG tablet TAKE  (1)  TABLET TWICE A DAY. 01/11/17  Yes Micki RileySethi, Pramod S, MD  omeprazole (PRILOSEC) 20 MG capsule Take 20 mg by mouth daily.   Yes [provider]    Physical Exam: Vitals:   02/03/19 1500 02/03/19 1509 02/03/19 1600 02/03/19 1645  BP: (!) 165/97 (!) 165/97 (!) 166/96 (!) 174/93  Pulse: 74  73 65  Resp: 12  (!) 9 19  Temp:    98.4 F (36.9 C)  TempSrc:    Oral  SpO2: 92%  98% 99%  Weight:    64.4 kg  Height:    5\' 2"  (1.575 m)    Constitutional: NAD, calm, comfortable Vitals:   02/03/19 1500 02/03/19 1509 02/03/19 1600 02/03/19 1645  BP: (!) 165/97 (!) 165/97 (!) 166/96 (!) 174/93  Pulse: 74  73 65  Resp: 12  (!) 9 19  Temp:    98.4 F (36.9 C)  TempSrc:    Oral  SpO2: 92%  98% 99%  Weight:    64.4 kg  Height:    5\' 2"  (1.575 m)   Eyes: PERRL, lids and conjunctivae normal ENMT: Mucous membranes are moist. Posterior pharynx  clear of any exudate or lesions.Normal dentition.  Neck: normal, supple, no masses, no thyromegaly Respiratory: clear to auscultation bilaterally, no wheezing, no crackles. Normal respiratory effort. No accessory muscle use.  Cardiovascular: Regular rate and rhythm, no murmurs / rubs / gallops. No extremity edema. 2+ pedal pulses. No carotid bruits.  Abdomen:  At the time of my evaluation, her abdomen no tenderness, no masses palpated. No hepatosplenomegaly. Bowel sounds positive.  Patient feels so much better.  Musculoskeletal: no clubbing / cyanosis. No joint deformity upper and lower extremities. Good ROM, no contractures. Normal muscle tone.  Skin: no rashes, lesions, ulcers. No induration Neurologic: CN 2-12 grossly intact. Strength 5/5 in all 4.  Psychiatric: Normal judgment and insight. Alert and oriented x 3. Normal mood.   Labs on Admission: I have personally reviewed following labs and imaging studies  CBC: Recent Labs  Lab 02/03/19 1134  WBC 8.4  NEUTROABS 6.4  HGB 15.2*  HCT 43.5  MCV 94.0  PLT 216   Basic Metabolic Panel: Recent Labs  Lab 02/03/19 1134  NA 131*  K 2.2*  CL 79*  CO2 32  GLUCOSE 173*  BUN 23*  CREATININE 0.70  CALCIUM 9.7  MG 1.9   Liver Function Tests: Recent Labs  Lab 02/03/19 1134  AST 17  ALT 17  ALKPHOS 62  BILITOT 0.5  PROT 7.8  ALBUMIN 4.4   Recent Labs  Lab 02/03/19 1134  LIPASE 27   Urine analysis:    Component Value Date/Time   COLORURINE YELLOW 02/03/2019 1357   APPEARANCEUR HAZY (A) 02/03/2019 1357   LABSPEC 1.016 02/03/2019 1357   PHURINE 7.0 02/03/2019 1357   GLUCOSEU NEGATIVE 02/03/2019 1357   HGBUR NEGATIVE 02/03/2019 1357   BILIRUBINUR NEGATIVE 02/03/2019 1357   KETONESUR 5 (A) 02/03/2019 1357   PROTEINUR NEGATIVE 02/03/2019 1357   UROBILINOGEN 0.2 05/22/2015 2237   NITRITE POSITIVE (A) 02/03/2019 1357   LEUKOCYTESUR TRACE (A) 02/03/2019 1357    Radiological Exams on Admission: Dg Abd Acute 2+v W 1v  Chest  Result Date: 02/03/2019 CLINICAL DATA:  Abdominal pain, nausea, and vomiting since Tuesday, assess bowel-gas pattern EXAM: DG ABDOMEN ACUTE W/ 1V CHEST COMPARISON:  Chest radiograph 09/07/2015 FINDINGS: Normal heart size, mediastinal contours, and pulmonary vascularity. Lungs clear. Azygos fissure incidentally noted. No pleural effusion or pneumothorax. Normal bowel gas pattern. No bowel dilatation, bowel wall thickening, or free air. Levoconvex lumbar scoliosis with facet degenerative changes lower lumbar spine. No acute osseous findings or definite urinary tract calcification. IMPRESSION: No acute abnormalities. Electronically Signed   By: Ulyses SouthwardMark  Boles M.D.   On: 02/03/2019 14:55    EKG: Independently reviewed.  Sinus rhythm.  Rate 73.  QTC prolonged at 503.  Assessment/Plan Active Problems:   Hypokalemia   Hypokalemia-potassium 2.2.  Likely from poor p.o. intake and multiple episodes of vomiting over the past 5 days.  -Magnesium 1.9 -Replete potassium p.o. and IV -BMP a.m.  Epigastric abdominal pain-  With vomiting.  Differentials gastritis versus peptic ulcer disease versus viral gastroenteritis. Reports one-time episode of black stool.  Hemoglobin stable at 15.2 (? Hemoconcentrated).  Reports history of peptic ulcer disease H. Pylori, and had EGD done at [facility] but I do not see this on file.  At the time of my evaluation abdominal pain has significantly improved, no vomiting in ED. Abdominal x-ray negative for acute abnormality. No leukocytosis, afebrile, hence held off on further imaging. -IV Protonix 40 daily -IV morphine, Phenergan PRN -Trend hemoglobin - Pending clinical course likely follow-up with GI as outpatient - N/s + 40 KCL 100cc/hr x 15hrs  UTI- dysuria 2 weeks ago.  UA suggestive with leukocytes nitrites and bacteria.  WBC 8.5.  Afebrile. -Continue IV ceftriaxone -Obtain urine cultures  Prolonged QTC- 503 likely from hypokalemia.  Mag normal at 1.9. -Repeat EKG  in a.m.  Seizures-reports last seizure ~3 years ago. -Continue Keppra 500 mg every 12 hourly as IV for now  CVA-2016. No residual Deficits.  She is on low-dose aspirin. -Aspirin held for now pending stable hemoglobin.   HTN- elevated systolic 175Z to 025E.  Not on home antihypertensives. -PRN hydralazine  Asthma-stable.  Not on home bronchodilators.  DVT prophylaxis: SCDS Code Status: Full Family Communication: None at bedside Disposition Plan: 1-2 days Consults called: None Admission status:  Obs, tele   Bethena Roys MD [facility]  02/03/2019, 5:03 PM

## 2019-02-04 DIAGNOSIS — N3000 Acute cystitis without hematuria: Secondary | ICD-10-CM

## 2019-02-04 DIAGNOSIS — R112 Nausea with vomiting, unspecified: Secondary | ICD-10-CM

## 2019-02-04 LAB — CBC
HCT: 39.5 % (ref 36.0–46.0)
Hemoglobin: 13.3 g/dL (ref 12.0–15.0)
MCH: 32.9 pg (ref 26.0–34.0)
MCHC: 33.7 g/dL (ref 30.0–36.0)
MCV: 97.8 fL (ref 80.0–100.0)
Platelets: 184 10*3/uL (ref 150–400)
RBC: 4.04 MIL/uL (ref 3.87–5.11)
RDW: 12.4 % (ref 11.5–15.5)
WBC: 6 10*3/uL (ref 4.0–10.5)
nRBC: 0 % (ref 0.0–0.2)

## 2019-02-04 LAB — BASIC METABOLIC PANEL
Anion gap: 8 (ref 5–15)
BUN: 7 mg/dL (ref 6–20)
CO2: 26 mmol/L (ref 22–32)
Calcium: 8.7 mg/dL — ABNORMAL LOW (ref 8.9–10.3)
Chloride: 103 mmol/L (ref 98–111)
Creatinine, Ser: 0.46 mg/dL (ref 0.44–1.00)
GFR calc Af Amer: >60 mL/min (ref >60)
GFR calc non Af Amer: >60 mL/min (ref >60)
Glucose, Bld: 115 mg/dL — ABNORMAL HIGH (ref 70–99)
Potassium: 4.8 mmol/L (ref 3.5–5.1)
Sodium: 137 mmol/L (ref 135–145)

## 2019-02-04 LAB — NOVEL CORONAVIRUS, NAA (HOSP ORDER, SEND-OUT TO REF LAB; TAT 18-24 HRS): SARS-CoV-2, NAA: NOT DETECTED

## 2019-02-04 LAB — HIV ANTIBODY (ROUTINE TESTING W REFLEX): HIV Screen 4th Generation wRfx: NONREACTIVE

## 2019-02-04 MED ORDER — AMLODIPINE BESYLATE 5 MG PO TABS: 5.0000 mg | ORAL_TABLET | Freq: Every day | ORAL | Status: DC

## 2019-02-04 NOTE — Progress Notes (Signed)
Patient leaving AMA. Signed form in chart. MD notified. Patient educated on risk of leaving. She states that she can manage at home as she takes daily potassium.

## 2019-02-04 NOTE — Discharge Summary (Signed)
Physician Discharge Summary  Danielle Washington OIZ:[id number] DOB: 06/04/1976 DOA: 02/03/2019  PCP: Sandria Manly Luther date: 3/38/2505 Discharge date: 02/04/2019  Time spent: 20 minutes  Recommendations for Outpatient Follow-up:  1. Patient left AMA 2. Follow final culture data from UA and treat UTI if needed.    Discharge Diagnoses:  Active Problems:   Hypokalemia nausea/vomtiing Abnormal UA  Discharge Condition: patient left AMA. Per nursing reports patient expressed feeling great and having no symptoms or acute complaints at discharge.   Filed Weights   02/03/19 1117 02/03/19 1645 02/04/19 0500  Weight: 64.4 kg 64.4 kg 70.3 kg    History of present illness:  43 y.o. female with medical history significant for CVA, seizures, hypertension, asthma, restless leg syndrome and anxiety who presented to the ED with complaints of multiple episodes of vomiting and upper abdominal pain that started 5 days ago.  Patient reports up to 4 episodes of vomiting daily, nonbloody, not coffee-ground appearing.  No loose stools.  No fever or chills.  Feels her stool this morning was black, but no blood in stool.  She tells me she has had H. Pylori in the past and history of multiple stomach ulcers.  Reports compliance with her omeprazole. Patient also reports having pain while voiding about 2 weeks ago.  Hospital Course:  1-nausea/vomiting: -Patient left against medical advice and no medications were prescribed.  -there was concerns for UTI -cultures pending at discharge -please follow final culture data and treat accordingly if needed   Procedures:    Consultations:    Discharge Exam: Vitals:   02/03/19 2134 02/04/19 0517  BP: (!) 174/96 (!) 190/106  Pulse: 72 75  Resp: 12 12  Temp: 98.1 F (36.7 C) 98 F (36.7 C)  SpO2: 98% 97%    General: patient with stable VS and per nursing reports feeling great. No further nausea or vomiting reported. Electrolytes WNL.  Patient decide to leave AMA, despite receiving information of me coming to see her and with anticipated of proper discharge. When I got to her Room she has already sign AMA papers and per nursing staff just walked off the floor.   Discharge Instructions   The results of significant diagnostics from this hospitalization (including imaging, microbiology, ancillary and laboratory) are listed below for reference.    Significant Diagnostic Studies: Dg Abd Acute 2+v W 1v Chest  Result Date: 02/03/2019 CLINICAL DATA:  Abdominal pain, nausea, and vomiting since Tuesday, assess bowel-gas pattern EXAM: DG ABDOMEN ACUTE W/ 1V CHEST COMPARISON:  Chest radiograph 09/07/2015 FINDINGS: Normal heart size, mediastinal contours, and pulmonary vascularity. Lungs clear. Azygos fissure incidentally noted. No pleural effusion or pneumothorax. Normal bowel gas pattern. No bowel dilatation, bowel wall thickening, or free air. Levoconvex lumbar scoliosis with facet degenerative changes lower lumbar spine. No acute osseous findings or definite urinary tract calcification. IMPRESSION: No acute abnormalities. Electronically Signed   By: Lavonia Dana M.D.   On: 02/03/2019 14:55   Labs: Basic Metabolic Panel: Recent Labs  Lab 02/03/19 1134 02/03/19 2045 02/04/19 0453  NA 131*  --  137  K 2.2* 3.2* 4.8  CL 79*  --  103  CO2 32  --  26  GLUCOSE 173*  --  115*  BUN 23*  --  7  CREATININE 0.70  --  0.46  CALCIUM 9.7  --  8.7*  MG 1.9  --   --    Liver Function Tests: Recent Labs  Lab 02/03/19 1134  AST 17  ALT 17  ALKPHOS 62  BILITOT 0.5  PROT 7.8  ALBUMIN 4.4   Recent Labs  Lab 02/03/19 1134  LIPASE 27   CBC: Recent Labs  Lab 02/03/19 1134 02/03/19 2045 02/04/19 0453  WBC 8.4  --  6.0  NEUTROABS 6.4  --   --   HGB 15.2* 13.3 13.3  HCT 43.5 40.0 39.5  MCV 94.0  --  97.8  PLT 216  --  184    Signed:  Vassie Lollarlos Storey Stangeland MD.  [facility] 02/04/2019, 10:46 AM

## 2019-02-06 LAB — URINE CULTURE: Culture: >=100000 — AB

## 2021-01-26 ENCOUNTER — Emergency Department (HOSPITAL_COMMUNITY): Payer: Self-pay

## 2021-01-26 ENCOUNTER — Encounter (HOSPITAL_COMMUNITY): Payer: Self-pay | Admitting: *Deleted

## 2021-01-26 ENCOUNTER — Other Ambulatory Visit: Payer: Self-pay

## 2021-01-26 ENCOUNTER — Inpatient Hospital Stay (HOSPITAL_COMMUNITY)
Admission: EM | Admit: 2021-01-26 | Discharge: 2021-01-27 | DRG: 641 | Disposition: A | Discharge status: Home / Self Care | Payer: Self-pay | Attending: Internal Medicine | Admitting: Internal Medicine

## 2021-01-26 DIAGNOSIS — Z8673 Personal history of transient ischemic attack (TIA), and cerebral infarction without residual deficits: Secondary | ICD-10-CM

## 2021-01-26 DIAGNOSIS — K2900 Acute gastritis without bleeding: Secondary | ICD-10-CM | POA: Diagnosis present

## 2021-01-26 DIAGNOSIS — E876 Hypokalemia: Principal | ICD-10-CM

## 2021-01-26 DIAGNOSIS — K529 Noninfective gastroenteritis and colitis, unspecified: Secondary | ICD-10-CM | POA: Diagnosis present

## 2021-01-26 DIAGNOSIS — K219 Gastro-esophageal reflux disease without esophagitis: Secondary | ICD-10-CM | POA: Diagnosis present

## 2021-01-26 DIAGNOSIS — I639 Cerebral infarction, unspecified: Secondary | ICD-10-CM | POA: Diagnosis present

## 2021-01-26 DIAGNOSIS — Z20822 Contact with and (suspected) exposure to covid-19: Secondary | ICD-10-CM | POA: Diagnosis present

## 2021-01-26 DIAGNOSIS — Z79899 Other long term (current) drug therapy: Secondary | ICD-10-CM

## 2021-01-26 DIAGNOSIS — I10 Essential (primary) hypertension: Secondary | ICD-10-CM | POA: Diagnosis present

## 2021-01-26 DIAGNOSIS — G2581 Restless legs syndrome: Secondary | ICD-10-CM | POA: Diagnosis present

## 2021-01-26 DIAGNOSIS — A084 Viral intestinal infection, unspecified: Secondary | ICD-10-CM | POA: Diagnosis present

## 2021-01-26 DIAGNOSIS — Z720 Tobacco use: Secondary | ICD-10-CM

## 2021-01-26 DIAGNOSIS — G40909 Epilepsy, unspecified, not intractable, without status epilepticus: Secondary | ICD-10-CM | POA: Diagnosis present

## 2021-01-26 DIAGNOSIS — R059 Cough, unspecified: Secondary | ICD-10-CM

## 2021-01-26 DIAGNOSIS — Z833 Family history of diabetes mellitus: Secondary | ICD-10-CM

## 2021-01-26 DIAGNOSIS — Z7982 Long term (current) use of aspirin: Secondary | ICD-10-CM

## 2021-01-26 DIAGNOSIS — Z28310 Unvaccinated for covid-19: Secondary | ICD-10-CM

## 2021-01-26 DIAGNOSIS — R112 Nausea with vomiting, unspecified: Secondary | ICD-10-CM

## 2021-01-26 DIAGNOSIS — Z823 Family history of stroke: Secondary | ICD-10-CM

## 2021-01-26 DIAGNOSIS — Z801 Family history of malignant neoplasm of trachea, bronchus and lung: Secondary | ICD-10-CM

## 2021-01-26 DIAGNOSIS — F17210 Nicotine dependence, cigarettes, uncomplicated: Secondary | ICD-10-CM | POA: Diagnosis present

## 2021-01-26 DIAGNOSIS — Z8249 Family history of ischemic heart disease and other diseases of the circulatory system: Secondary | ICD-10-CM

## 2021-01-26 DIAGNOSIS — R569 Unspecified convulsions: Secondary | ICD-10-CM

## 2021-01-26 DIAGNOSIS — Z90711 Acquired absence of uterus with remaining cervical stump: Secondary | ICD-10-CM

## 2021-01-26 DIAGNOSIS — Z825 Family history of asthma and other chronic lower respiratory diseases: Secondary | ICD-10-CM

## 2021-01-26 DIAGNOSIS — Z716 Tobacco abuse counseling: Secondary | ICD-10-CM

## 2021-01-26 DIAGNOSIS — J45909 Unspecified asthma, uncomplicated: Secondary | ICD-10-CM | POA: Diagnosis present

## 2021-01-26 LAB — CBC WITH DIFFERENTIAL/PLATELET
Abs Immature Granulocytes: 0.07 10*3/uL (ref 0.00–0.07)
Basophils Absolute: 0 10*3/uL (ref 0.0–0.1)
Basophils Relative: 0 %
Eosinophils Absolute: 0 10*3/uL (ref 0.0–0.5)
Eosinophils Relative: 0 %
HCT: 49.1 % — ABNORMAL HIGH (ref 36.0–46.0)
Hemoglobin: 16.5 g/dL — ABNORMAL HIGH (ref 12.0–15.0)
Immature Granulocytes: 1 %
Lymphocytes Relative: 13 %
Lymphs Abs: 1.8 10*3/uL (ref 0.7–4.0)
MCH: 31.9 pg (ref 26.0–34.0)
MCHC: 33.6 g/dL (ref 30.0–36.0)
MCV: 95 fL (ref 80.0–100.0)
Monocytes Absolute: 1 10*3/uL (ref 0.1–1.0)
Monocytes Relative: 8 %
Neutro Abs: 10.9 10*3/uL — ABNORMAL HIGH (ref 1.7–7.7)
Neutrophils Relative %: 78 %
Platelets: 163 10*3/uL (ref 150–400)
RBC: 5.17 MIL/uL — ABNORMAL HIGH (ref 3.87–5.11)
RDW: 13.5 % (ref 11.5–15.5)
WBC: 13.8 10*3/uL — ABNORMAL HIGH (ref 4.0–10.5)
nRBC: 0 % (ref 0.0–0.2)

## 2021-01-26 LAB — COMPREHENSIVE METABOLIC PANEL
ALT: 10 U/L (ref 0–44)
AST: 14 U/L — ABNORMAL LOW (ref 15–41)
Albumin: 4.5 g/dL (ref 3.5–5.0)
Alkaline Phosphatase: 90 U/L (ref 38–126)
Anion gap: 14 (ref 5–15)
BUN: 27 mg/dL — ABNORMAL HIGH (ref 6–20)
CO2: 33 mmol/L — ABNORMAL HIGH (ref 22–32)
Calcium: 9.7 mg/dL (ref 8.9–10.3)
Chloride: 89 mmol/L — ABNORMAL LOW (ref 98–111)
Creatinine, Ser: 0.81 mg/dL (ref 0.44–1.00)
GFR, Estimated: >60 mL/min (ref >60)
Glucose, Bld: 127 mg/dL — ABNORMAL HIGH (ref 70–99)
Potassium: 2.5 mmol/L — CL (ref 3.5–5.1)
Sodium: 136 mmol/L (ref 135–145)
Total Bilirubin: 0.9 mg/dL (ref 0.3–1.2)
Total Protein: 8.2 g/dL — ABNORMAL HIGH (ref 6.5–8.1)

## 2021-01-26 LAB — URINALYSIS, ROUTINE W REFLEX MICROSCOPIC
Bacteria, UA: NONE SEEN
Bilirubin Urine: NEGATIVE
Glucose, UA: NEGATIVE mg/dL
Ketones, ur: 20 mg/dL — AB
Leukocytes,Ua: NEGATIVE
Nitrite: NEGATIVE
Protein, ur: 30 mg/dL — AB
Specific Gravity, Urine: 1.013 (ref 1.005–1.030)
pH: 8 (ref 5.0–8.0)

## 2021-01-26 LAB — RESP PANEL BY RT-PCR (FLU A&B, COVID) ARPGX2
Influenza A by PCR: NEGATIVE
Influenza B by PCR: NEGATIVE
SARS Coronavirus 2 by RT PCR: NEGATIVE

## 2021-01-26 LAB — HIV ANTIBODY (ROUTINE TESTING W REFLEX): HIV Screen 4th Generation wRfx: NONREACTIVE

## 2021-01-26 LAB — MAGNESIUM: Magnesium: 2.4 mg/dL (ref 1.7–2.4)

## 2021-01-26 MED ORDER — NICOTINE 14 MG/24HR TD PT24
14.0000 mg | MEDICATED_PATCH | Freq: Every day | TRANSDERMAL | Status: DC
  Administered 2021-01-26 – 2021-01-27 (×2): 14 mg via TRANSDERMAL
  Filled 2021-01-26 (×2): qty 1

## 2021-01-26 MED ORDER — ENOXAPARIN SODIUM 40 MG/0.4ML IJ SOSY
40.0000 mg | PREFILLED_SYRINGE | INTRAMUSCULAR | Status: DC
  Administered 2021-01-26: 40 mg via SUBCUTANEOUS
  Filled 2021-01-26: qty 0.4

## 2021-01-26 MED ORDER — LEVETIRACETAM IN NACL 500 MG/100ML IV SOLN
500.0000 mg | Freq: Two times a day (BID) | INTRAVENOUS | Status: DC
  Administered 2021-01-26 – 2021-01-27 (×2): 500 mg via INTRAVENOUS
  Filled 2021-01-26 (×2): qty 100

## 2021-01-26 MED ORDER — PANTOPRAZOLE SODIUM 40 MG IV SOLR
40.0000 mg | Freq: Once | INTRAVENOUS | Status: AC
  Administered 2021-01-26: 40 mg via INTRAVENOUS
  Filled 2021-01-26: qty 40

## 2021-01-26 MED ORDER — POTASSIUM CHLORIDE IN NACL 40-0.9 MEQ/L-% IV SOLN: INTRAVENOUS | Status: DC

## 2021-01-26 MED ORDER — HYDRALAZINE HCL 20 MG/ML IJ SOLN
10.0000 mg | Freq: Once | INTRAMUSCULAR | Status: AC
  Administered 2021-01-26: 10 mg via INTRAVENOUS
  Filled 2021-01-26: qty 1

## 2021-01-26 MED ORDER — PANTOPRAZOLE SODIUM 40 MG IV SOLR
40.0000 mg | INTRAVENOUS | Status: DC
  Administered 2021-01-26: 40 mg via INTRAVENOUS
  Filled 2021-01-26: qty 40

## 2021-01-26 MED ORDER — ONDANSETRON HCL 4 MG PO TABS: 4.0000 mg | ORAL_TABLET | Freq: Four times a day (QID) | ORAL | Status: DC | PRN

## 2021-01-26 MED ORDER — SODIUM CHLORIDE 0.9 % IV BOLUS
1000.0000 mL | Freq: Once | INTRAVENOUS | Status: AC
  Administered 2021-01-26: 1000 mL via INTRAVENOUS

## 2021-01-26 MED ORDER — SODIUM CHLORIDE 0.9 % IV SOLN
12.5000 mg | Freq: Once | INTRAVENOUS | Status: AC
  Administered 2021-01-26: 12.5 mg via INTRAVENOUS
  Filled 2021-01-26: qty 0.5

## 2021-01-26 MED ORDER — ONDANSETRON HCL 4 MG/2ML IJ SOLN
4.0000 mg | Freq: Four times a day (QID) | INTRAMUSCULAR | Status: DC | PRN
  Administered 2021-01-27: 4 mg via INTRAVENOUS
  Filled 2021-01-26: qty 2

## 2021-01-26 MED ORDER — POTASSIUM CHLORIDE 10 MEQ/100ML IV SOLN
10.0000 meq | INTRAVENOUS | Status: AC
  Administered 2021-01-26 (×3): 10 meq via INTRAVENOUS
  Filled 2021-01-26 (×3): qty 100

## 2021-01-26 NOTE — ED Notes (Signed)
Critical lab result Potassium of 2.5. Notified Dr. Denton Lank.

## 2021-01-26 NOTE — ED Triage Notes (Signed)
Vomiting for the past 2 days

## 2021-01-26 NOTE — Progress Notes (Signed)
   01/26/21 2340  Vitals  BP (!) 178/84  BP Location Right Arm  BP Method Manual  Pulse Rate 98  Pulse Rate Source Monitor   Patient given hydralazine 10 mg IV once. BP recheck. MD notified. Instructed to notify MD if systolic BP increases over 180.

## 2021-01-26 NOTE — ED Provider Notes (Signed)
Emergency Medicine Provider Triage Evaluation Note  Danielle Washington , a 45 y.o. female  was evaluated in triage.  Pt complains of weakness.  Pt reports she has been vomiting and has a history of her potassium dropping low when she vomits   Review of Systems  Positive: falnk pain and uti symptoms  Negative: fever  Physical Exam  BP 116/83 (BP Location: Right Arm)   Pulse (!) 102   Temp 98.4 F (36.9 C) (Oral)   Resp 16   SpO2 96%  Gen:   Awake, no distress   Resp:  Normal effort  MSK:   Moves extremities without difficulty  Other:    Medical Decision Making  Medically screening exam initiated at 11:47 AM.  Appropriate orders placed.  Danielle Washington was informed that the remainder of the evaluation will be completed by another provider, this initial triage assessment does not replace that evaluation, and the importance of remaining in the ED until their evaluation is complete.     Danielle Washington, [place] 01/26/21 1148    Danielle Laine, MD 01/29/21 1529

## 2021-01-26 NOTE — ED Notes (Signed)
ED TO INPATIENT HANDOFF REPORT  ED Nurse Washington and Phone #:   Danielle Washington/Age/Gender Danielle Washington 45 y.o. female Room/Bed: APA06/APA06  Code Status   Code Status: Prior  Home/SNF/Other Home Patient oriented to: self, place, time and situation Is this baseline? Yes   Triage Complete: Triage complete  Chief Complaint Hypokalemia due to excessive gastrointestinal loss of potassium [E87.6]  Triage Note Vomiting for the past 2 days    Allergies No Known Allergies  Level of Care/Admitting Diagnosis ED Disposition    ED Disposition Condition Comment   Admit  Hospital Area: [facility] [[id number]]  Level of Care: Telemetry [5]  Covid Evaluation: Symptomatic Person Under Investigation (PUI)  Diagnosis: Hypokalemia due to excessive gastrointestinal loss of potassium [[id number]]  Admitting Physician: Lonia Mad  Attending Physician: Lonia Mad  Estimated length of stay: past midnight tomorrow  Certification:: I certify this patient will need inpatient services for at least 2 midnights       B Medical/Surgery History Past Medical History:  Diagnosis Date  . Anxiety   . Asthma   . GERD (gastroesophageal reflux disease)   . Hypertension   . Hypokalemia   . Restless leg syndrome   . Seizures (HCC)    09/04/2015. Unknown etiology and on Keppra  . Stroke [facility])    x3 in September 2016. Numbness and tingling of hands is only deficit.   Past Surgical History:  Procedure Laterality Date  . LAPAROSCOPIC BILATERAL SALPINGECTOMY Bilateral 01/06/2016   Procedure: LAPAROSCOPIC BILATERAL SALPINGECTOMY FOR STERILIZATION;  Surgeon: Lazaro Arms, MD;  Location: [facility];  Service: Gynecology;  Laterality: Bilateral;  . NO PAST SURGERIES    . PARTIAL HYSTERECTOMY    . TEE WITHOUT CARDIOVERSION N/A 05/26/2015   Procedure: TRANSESOPHAGEAL ECHOCARDIOGRAM (TEE);  Surgeon: Quintella Reichert, MD;  Location: [facility] ENDOSCOPY;  Service: Cardiovascular;  Laterality: N/A;      A IV Location/Drains/Wounds Patient Lines/Drains/Airways Status    Active Line/Drains/Airways    Washington Placement date Placement time Site Days   Peripheral IV 01/26/21 20 G Left Wrist 01/26/21  1427  Wrist  less than 1   Peripheral IV 01/26/21 20 G Left Antecubital 01/26/21  1428  Antecubital  less than 1   Incision (Closed) 01/06/16 Abdomen Other (Comment) 01/06/16  0958  -- 1847   Incision - 3 Ports Abdomen 1: Umbilicus 2: Right;Lower 3: Left;Lower 01/06/16  0952  -- 1847          Intake/Output Last 24 hours  Intake/Output Summary (Last 24 hours) at 01/26/2021 1627 Last data filed at 01/26/2021 1600 Gross per 24 hour  Intake 1045.6 ml  Output --  Net 1045.6 ml    Labs/Imaging Results for orders placed or performed during the hospital encounter of 01/26/21 (from the past 48 hour(Danielle))  CBC with Differential/Platelet     Status: Abnormal   Collection Time: 01/26/21 12:05 PM  Result Value Ref Range   WBC 13.8 (H) 4.0 - 10.5 K/uL   RBC 5.17 (H) 3.87 - 5.11 MIL/uL   Hemoglobin 16.5 (H) 12.0 - 15.0 g/dL   HCT 58.8 (H) 50.2 - 77.4 %   MCV 95.0 80.0 - 100.0 fL   MCH 31.9 26.0 - 34.0 pg   MCHC 33.6 30.0 - 36.0 g/dL   RDW 12.8 78.6 - 76.7 %   Platelets 163 150 - 400 K/uL   nRBC 0.0 0.0 - 0.2 %   Neutrophils Relative % 78 %   Neutro Abs 10.9 (H)  1.7 - 7.7 K/uL   Lymphocytes Relative 13 %   Lymphs Abs 1.8 0.7 - 4.0 K/uL   Monocytes Relative 8 %   Monocytes Absolute 1.0 0.1 - 1.0 K/uL   Eosinophils Relative 0 %   Eosinophils Absolute 0.0 0.0 - 0.5 K/uL   Basophils Relative 0 %   Basophils Absolute 0.0 0.0 - 0.1 K/uL   Immature Granulocytes 1 %   Abs Immature Granulocytes 0.07 0.00 - 0.07 K/uL    Comment: Performed at [facility], [address], [place], [place] [address]  Comprehensive metabolic panel     Status: Abnormal   Collection Time: 01/26/21 12:05 PM  Result Value Ref Range   Sodium 136 135 - 145 mmol/L   Potassium 2.5 (LL) 3.5 - 5.1 mmol/L    Comment: CRITICAL  RESULT CALLED TO, READ BACK BY AND VERIFIED WITH: K. FIPES 01/26/21 @ 1323 BY Danielle BEARD.    Chloride 89 (L) 98 - 111 mmol/L   CO2 33 (H) 22 - 32 mmol/L   Glucose, Bld 127 (H) 70 - 99 mg/dL    Comment: Glucose reference range applies only to samples taken after fasting for at least 8 hours.   BUN 27 (H) 6 - 20 mg/dL   Creatinine, Ser 8.25 0.44 - 1.00 mg/dL   Calcium 9.7 8.9 - 05.3 mg/dL   Total Protein 8.2 (H) 6.5 - 8.1 g/dL   Albumin 4.5 3.5 - 5.0 g/dL   AST 14 (L) 15 - 41 U/L   ALT 10 0 - 44 U/L   Alkaline Phosphatase 90 38 - 126 U/L   Total Bilirubin 0.9 0.3 - 1.2 mg/dL   GFR, Estimated >97 >67 mL/min    Comment: (NOTE) Calculated using the CKD-EPI Creatinine Equation (2021)    Anion gap 14 5 - 15    Comment: Performed at [facility], [address] [place], [place] [address]  Magnesium     Status: None   Collection Time: 01/26/21 12:05 PM  Result Value Ref Range   Magnesium 2.4 1.7 - 2.4 mg/dL    Comment: Performed at [facility], [address], [place], [place] [address]   CT Abdomen Pelvis Wo Contrast  Result Date: 01/26/2021 CLINICAL DATA:  Left-sided abdominal pain EXAM: CT ABDOMEN AND PELVIS WITHOUT CONTRAST TECHNIQUE: Multidetector CT imaging of the abdomen and pelvis was performed following the standard protocol without IV contrast. COMPARISON:  None. FINDINGS: Lower chest: No acute abnormality. Hepatobiliary: No focal liver abnormality is seen. No gallstones, gallbladder wall thickening, or biliary dilatation. Pancreas: Unremarkable. Spleen: Unremarkable. Adrenals/Urinary Tract: Adrenals, kidneys, and partially distended bladder are unremarkable. Stomach/Bowel: Stomach is within normal limits. Bowel is normal in caliber. Normal appendix. Vascular/Lymphatic: Aortic atherosclerosis. No enlarged lymph nodes. Reproductive: Uterus and bilateral adnexa are unremarkable. Other: No free fluid.  Abdominal wall is unremarkable. Musculoskeletal: Degenerative changes of the included spine.  No acute osseous abnormality. IMPRESSION: No acute abnormality. Electronically Signed   By: Guadlupe Spanish M.D.   On: 01/26/2021 14:24   DG Chest 1 View  Result Date: 01/26/2021 CLINICAL DATA:  Cough and weakness. EXAM: CHEST  1 VIEW COMPARISON:  Chest x-ray 09/07/2015, 09/05/2015. FINDINGS: Mediastinum and hilar structures normal. Lungs are clear. No pleural effusion or pneumothorax. Heart size normal. Degenerative changes scoliosis thoracic spine. IMPRESSION: No acute cardiopulmonary disease. Electronically Signed   By: Maisie Fus  Register   On: 01/26/2021 14:35    Pending Labs Unresulted Labs (From admission, onward)  Start     Ordered   01/26/21 1346  Resp Panel by RT-PCR (Flu A&B, Covid) Nasopharyngeal Swab  (Tier 2 - Symptomatic/asymptomatic with Precautions )  Once,   STAT       Question Answer Comment  Is this test for diagnosis or screening Diagnosis of ill patient   Symptomatic for COVID-19 as defined by CDC Yes   Date of Symptom Onset 01/24/2021   Hospitalized for COVID-19 Yes   Admitted to ICU for COVID-19 No   Previously tested for COVID-19 Yes   Resident in a congregate (group) care setting No   Employed in healthcare setting No   Pregnant No   Has patient completed COVID vaccination(Danielle) (2 doses of Pfizer/Moderna 1 dose of [employer]) No      01/26/21 1345   01/26/21 1148  Urinalysis, Routine w reflex microscopic Urine, Clean Catch  Once,   STAT        01/26/21 1147          Vitals/Pain Today'Danielle Vitals   01/26/21 1120 01/26/21 1321 01/26/21 1430 01/26/21 1530  BP: 116/83 (!) 161/97 (!) 159/98 (!) 177/85  Pulse: (!) 102 64 66 64  Resp: 16 15 11 14   Temp: 98.4 F (36.9 C)     TempSrc: Oral     SpO2: 96% 100% 96% 97%  PainSc:  8       Isolation Precautions Airborne and Contact precautions  Medications Medications  potassium chloride 10 mEq in 100 mL IVPB (10 mEq Intravenous New Bag/Given 01/26/21 1541)  sodium chloride 0.9 % bolus 1,000 mL (0 mLs  Intravenous Stopped 01/26/21 1600)  promethazine (PHENERGAN) 12.5 mg in sodium chloride 0.9 % 50 mL IVPB (0 mg Intravenous Stopped 01/26/21 1537)  pantoprazole (PROTONIX) injection 40 mg (40 mg Intravenous Given 01/26/21 1503)    Mobility walks     Focused Assessments    R Recommendations: See Admitting Provider Note  Report given to:   Additional Notes:

## 2021-01-26 NOTE — ED Notes (Signed)
Patient transported to CT 

## 2021-01-26 NOTE — H&P (Signed)
History and Physical    [name] KTG:[id number] DOB: 02-27-1976 DOA: 01/26/2021  PCP: [facility]   Patient coming from: Home  I have personally briefly reviewed patient's old medical records in [facility]  Chief Complaint: Nausea/Vomiting  HPI: Danielle Washington is a 45 y.o. female with medical history significant for GERD, hypertension, seizures who presents to the ER for evaluation of nausea, vomiting and inability to tolerate any oral intake. Patient states her symptoms started 2 days ago and have been persistent since then.  Her symptoms are associated with abdominal pain mostly in the suprapubic area that is nonradiating.  She denies having any urinary frequency, nocturia or dysuria at this time.  She had symptoms a week ago took over-the-counter Azo with improvement in her symptoms.  She had 1 episode of diarrhea which has resolved. Patient states that she was recently exposed to 2 of her coworkers that tested positive for the COVID-19 virus.  She is unvaccinated against COVID or influenza. She has had some low-grade fever and myalgias but denies having any cough, no shortness of breath, no chest pain, no dizziness, no lightheadedness, no headache, no focal deficits, no back pain, no blurred vision. Labs show sodium 136, potassium 2.5, chloride 89, bicarb 33, glucose 127, BUN 27, creatinine 0.81, calcium 9.7, magnesium 2.4, alkaline phosphatase 90, albumin 4.5, AST 14, ALT 10, total protein 8.2, white count 13.8, hemoglobin 16.5, hematocrit 49.1, MCV 95, RDW 13.5, platelet count 163 Urinalysis is sterile CT scan of abdomen and pelvis shows no acute abnormality. Chest x-ray reviewed by me shows no acute cardiopulmonary disease Her respiratory viral panel is still pending  ED Course: Patient is a 45 year old female who presents to the ER for evaluation of a 2-day history of nausea, vomiting and inability to tolerate any oral intake. She has had low-grade fever and  myalgias and admits to recent exposure to coworkers who tested positive for COVID-19 virus. Her respiratory viral panel is still pending but labs reveal hypokalemia. She will be admitted to the hospital for further evaluation.    Review of Systems: As per HPI otherwise all other systems reviewed and negative.    Past Medical History:  Diagnosis Date  . Anxiety   . Asthma   . GERD (gastroesophageal reflux disease)   . Hypertension   . Hypokalemia   . Restless leg syndrome   . Seizures (HCC)    09/04/2015. Unknown etiology and on Keppra  . Stroke [facility])    x3 in September 2016. Numbness and tingling of hands is only deficit.    Past Surgical History:  Procedure Laterality Date  . LAPAROSCOPIC BILATERAL SALPINGECTOMY Bilateral 01/06/2016   Procedure: LAPAROSCOPIC BILATERAL SALPINGECTOMY FOR STERILIZATION;  Surgeon: Lazaro Arms, MD;  Location: [facility];  Service: Gynecology;  Laterality: Bilateral;  . NO PAST SURGERIES    . PARTIAL HYSTERECTOMY    . TEE WITHOUT CARDIOVERSION N/A 05/26/2015   Procedure: TRANSESOPHAGEAL ECHOCARDIOGRAM (TEE);  Surgeon: Quintella Reichert, MD;  Location: [facility] ENDOSCOPY;  Service: Cardiovascular;  Laterality: N/A;     reports that she has been smoking cigarettes. She has a 20.00 pack-year smoking history. She has never used smokeless tobacco. She reports current alcohol use of about 2.0 standard drinks of alcohol per week. She reports that she does not use drugs.  No Known Allergies  Family History  Problem Relation Age of Onset  . Lung cancer Mother   . Heart disease Mother   . COPD Mother   .  Stroke Mother   . Heart attack Father   . Hypertension Father   . Stroke Maternal Grandmother   . Diabetes Maternal Grandmother       Prior to Admission medications   Medication Sig Start Date End Date Taking? Authorizing Provider  aspirin EC 81 MG tablet Take 81 mg by mouth daily.    [provider]  levETIRAcetam (KEPPRA) 500 MG tablet TAKE  (1)   TABLET TWICE A DAY. 01/11/17   Micki Riley, MD  omeprazole (PRILOSEC) 20 MG capsule Take 20 mg by mouth daily.    [provider]    Physical Exam: Vitals:   01/26/21 1321 01/26/21 1430 01/26/21 1530 01/26/21 1700  BP: (!) 161/97 (!) 159/98 (!) 177/85 (!) 173/88  Pulse: 64 66 64 65  Resp: 15 11 14 16   Temp:    98.7 F (37.1 C)  TempSrc:    Oral  SpO2: 100% 96% 97% 98%  Weight:    64.7 kg  Height:    5\' 1"  (1.549 m)     Vitals:   01/26/21 1321 01/26/21 1430 01/26/21 1530 01/26/21 1700  BP: (!) 161/97 (!) 159/98 (!) 177/85 (!) 173/88  Pulse: 64 66 64 65  Resp: 15 11 14 16   Temp:    98.7 F (37.1 C)  TempSrc:    Oral  SpO2: 100% 96% 97% 98%  Weight:    64.7 kg  Height:    5\' 1"  (1.549 m)      Constitutional: Alert and oriented x 3 . Not in any apparent distress HEENT:      Head: Normocephalic and atraumatic.         Eyes: PERLA, EOMI, Conjunctivae are normal. Sclera is non-icteric.       Mouth/Throat: Mucous membranes are moist.       Neck: Supple with no signs of meningismus. Cardiovascular: Regular rate and rhythm. No murmurs, gallops, or rubs. 2+ symmetrical distal pulses are present . No JVD. No LE edema Respiratory: Respiratory effort normal .Lungs sounds clear bilaterally. No wheezes, crackles, or rhonchi.  Gastrointestinal: Soft, non tender, and non distended with positive bowel sounds.  Genitourinary: No CVA tenderness. Musculoskeletal: Nontender with normal range of motion in all extremities. No cyanosis, or erythema of extremities. Neurologic:  Face is symmetric. Moving all extremities. No gross focal neurologic deficits  Skin: Skin is warm, dry.  No rash or ulcers Psychiatric: Mood and affect are normal   Labs on Admission: I have personally reviewed following labs and imaging studies  CBC: Recent Labs  Lab 01/26/21 1205  WBC 13.8*  NEUTROABS 10.9*  HGB 16.5*  HCT 49.1*  MCV 95.0  PLT 163   Basic Metabolic Panel: Recent Labs  Lab  01/26/21 1205  NA 136  K 2.5*  CL 89*  CO2 33*  GLUCOSE 127*  BUN 27*  CREATININE 0.81  CALCIUM 9.7  MG 2.4   GFR: Estimated Creatinine Clearance: 74.8 mL/min (by C-G formula based on SCr of 0.81 mg/dL). Liver Function Tests: Recent Labs  Lab 01/26/21 1205  AST 14*  ALT 10  ALKPHOS 90  BILITOT 0.9  PROT 8.2*  ALBUMIN 4.5   No results for input(s): LIPASE, AMYLASE in the last 168 hours. No results for input(s): AMMONIA in the last 168 hours. Coagulation Profile: No results for input(s): INR, PROTIME in the last 168 hours. Cardiac Enzymes: No results for input(s): CKTOTAL, CKMB, CKMBINDEX, TROPONINI in the last 168 hours. BNP (last 3 results) No results for  input(s): PROBNP in the last 8760 hours. HbA1C: No results for input(s): HGBA1C in the last 72 hours. CBG: No results for input(s): GLUCAP in the last 168 hours. Lipid Profile: No results for input(s): CHOL, HDL, LDLCALC, TRIG, CHOLHDL, LDLDIRECT in the last 72 hours. Thyroid Function Tests: No results for input(s): TSH, T4TOTAL, FREET4, T3FREE, THYROIDAB in the last 72 hours. Anemia Panel: No results for input(s): VITAMINB12, FOLATE, FERRITIN, TIBC, IRON, RETICCTPCT in the last 72 hours. Urine analysis:    Component Value Date/Time   COLORURINE YELLOW 01/26/2021 1712   APPEARANCEUR CLEAR 01/26/2021 1712   LABSPEC 1.013 01/26/2021 1712   PHURINE 8.0 01/26/2021 1712   GLUCOSEU NEGATIVE 01/26/2021 1712   HGBUR MODERATE (A) 01/26/2021 1712   BILIRUBINUR NEGATIVE 01/26/2021 1712   KETONESUR 20 (A) 01/26/2021 1712   PROTEINUR 30 (A) 01/26/2021 1712   UROBILINOGEN 0.2 05/22/2015 2237   NITRITE NEGATIVE 01/26/2021 1712   LEUKOCYTESUR NEGATIVE 01/26/2021 1712    Radiological Exams on Admission: CT Abdomen Pelvis Wo Contrast  Result Date: 01/26/2021 CLINICAL DATA:  Left-sided abdominal pain EXAM: CT ABDOMEN AND PELVIS WITHOUT CONTRAST TECHNIQUE: Multidetector CT imaging of the abdomen and pelvis was performed  following the standard protocol without IV contrast. COMPARISON:  None. FINDINGS: Lower chest: No acute abnormality. Hepatobiliary: No focal liver abnormality is seen. No gallstones, gallbladder wall thickening, or biliary dilatation. Pancreas: Unremarkable. Spleen: Unremarkable. Adrenals/Urinary Tract: Adrenals, kidneys, and partially distended bladder are unremarkable. Stomach/Bowel: Stomach is within normal limits. Bowel is normal in caliber. Normal appendix. Vascular/Lymphatic: Aortic atherosclerosis. No enlarged lymph nodes. Reproductive: Uterus and bilateral adnexa are unremarkable. Other: No free fluid.  Abdominal wall is unremarkable. Musculoskeletal: Degenerative changes of the included spine. No acute osseous abnormality. IMPRESSION: No acute abnormality. Electronically Signed   By: Guadlupe Spanish M.D.   On: 01/26/2021 14:24   DG Chest 1 View  Result Date: 01/26/2021 CLINICAL DATA:  Cough and weakness. EXAM: CHEST  1 VIEW COMPARISON:  Chest x-ray 09/07/2015, 09/05/2015. FINDINGS: Mediastinum and hilar structures normal. Lungs are clear. No pleural effusion or pneumothorax. Heart size normal. Degenerative changes scoliosis thoracic spine. IMPRESSION: No acute cardiopulmonary disease. Electronically Signed   By: Maisie Fus  Register   On: 01/26/2021 14:35     Assessment/Plan Principal Problem:   Hypokalemia due to excessive gastrointestinal loss of potassium Active Problems:   Stroke of unknown etiology (HCC)   Tobacco abuse   GERD (gastroesophageal reflux disease)   Seizures, generalized convulsive (HCC)     Hypokalemia Most likely secondary to GI losses from nausea and vomiting Supplement potassium Magnesium levels are within normal limits    Acute gastritis Unclear etiology ??  Viral Supportive care with antiemetics, IV PPI and IV fluid hydration     History of seizures We will place patient on IV Keppra since she is unable to tolerate any oral intake at this  time    Nicotine dependence Smoking cessation has been discussed with patient in detail We will place patient on nicotine transdermal patch 14 mg daily    History of CVA Hold aspirin for now and resume once patient is able to tolerate oral intake.  DVT prophylaxis: Lovenox Code Status: full code Family Communication: Greater than 50% of time was spent discussing plan of care with patient at the bedside.  All questions and concerns have been addressed.  She verbalizes understanding and agrees to the plan. Disposition Plan: Back to previous home environment Consults called: none Status: At the time of admission, it appears that  the appropriate admission status for this patient is inpatient. This is judged to be reasonable and necessary in order to provide the required intensity of service to ensure the patient's safety given the presenting symptoms, physical exam findings and initial radiographic and laboratory data in the context of the comorbid conditions. Patient requires inpatient status due to high intensity of service, high risk for further deterioration and high frequency of surveillance required.    Lucile Shuttersochukwu Tylah Mancillas MD [facility]     01/26/2021, 6:13 PM

## 2021-01-26 NOTE — ED Provider Notes (Signed)
[facility]   CSN: [id number] Arrival date & time: 01/26/21  1036     History Chief Complaint  Patient presents with  . Emesis    Danielle LandsbergRenee Washington is a 45 y.o. female.  HPI   Patient with significant medical history of GERD, hypertension, hypokalemia, seizures, presents to the emergency department with chief complaint of nausea and vomiting.  Patient states this started Sunday night, came on suddenly, states she has been unable to hold down p.o. or take any of her medications.  She denies eating different foods, denies other family member sick at this time.  she does endorse subjective fevers, chills, nasal congestion, and on productive cough.  She endorses recent sick contacts, states 2 of her coworkers had COVID and unfortunately she is not vaccinated against COVID or influenza. she denies recent alcohol use, denies NSAID use, does smoke 1 pack cigarettes daily, she has no history of pancreatitis, liver or gallbladder abnormalities.  Patient does endorse that last week she was having urinary frequency and dysuria but states this has Resolved, patient denies any alleviating factors.  Patient denies chest pain, shortness of breath, urinary symptoms or worsening pedal edema.  Past Medical History:  Diagnosis Date  . Anxiety   . Asthma   . GERD (gastroesophageal reflux disease)   . Hypertension   . Hypokalemia   . Restless leg syndrome   . Seizures (HCC)    09/04/2015. Unknown etiology and on Keppra  . Stroke [facility](HCC)    x3 in September 2016. Numbness and tingling of hands is only deficit.    Patient Active Problem List   Diagnosis Date Noted  . Hypokalemia due to excessive gastrointestinal loss of potassium 01/26/2021  . Acute gastritis 01/26/2021  . Hypokalemia 02/03/2019  . 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 GI bleed 09/06/2015  . 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 Hypertension  09/05/2015  . Asthma 09/05/2015  . Aspiration into respiratory tract 09/05/2015  . 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 Agitation   . Emesis   . Nausea and vomiting 05/23/2015  . Acute ischemic stroke (HCC) 05/23/2015  . Substance abuse (HCC) 05/23/2015  . Tobacco abuse 05/23/2015  . GERD (gastroesophageal reflux disease) 05/23/2015  . Stroke (cerebrum) (HCC) 05/23/2015  . Stroke of unknown etiology (HCC)   . Nausea with vomiting     Past Surgical History:  Procedure Laterality Date  . LAPAROSCOPIC BILATERAL SALPINGECTOMY Bilateral 01/06/2016   Procedure: LAPAROSCOPIC BILATERAL SALPINGECTOMY FOR STERILIZATION;  Surgeon: Lazaro ArmsLuther H Eure, MD;  Location: [facility];  Service: Gynecology;  Laterality: Bilateral;  . NO PAST SURGERIES    . PARTIAL HYSTERECTOMY    . TEE WITHOUT CARDIOVERSION N/A 05/26/2015   Procedure: TRANSESOPHAGEAL ECHOCARDIOGRAM (TEE);  Surgeon: Quintella Reichertraci R Turner, MD;  [facility];  Service: Cardiovascular;  Laterality: N/A;     OB History    Gravida  2   Para  2   Term  2   Preterm      AB      Living  2     SAB      IAB      Ectopic      Multiple      Live Births              Family History  Problem Relation Age of Onset  . Lung cancer Mother   . Heart disease Mother   . COPD Mother   .  Stroke Mother   . Heart attack Father   . Hypertension Father   . Stroke Maternal Grandmother   . Diabetes Maternal Grandmother     Social History   Tobacco Use  . Smoking status: Current Every Day Smoker    Packs/day: 1.00    Years: 20.00    Pack years: 20.00    Types: Cigarettes  . Smokeless tobacco: Never Used  Vaping Use  . Vaping Use: Never used  Substance Use Topics  . Alcohol use: Yes    Alcohol/week: 2.0 standard drinks    Types: 1 Glasses of wine, 1 Shots of liquor per week    Comment: occasionally  . Drug use: No    Home Medications Prior to Admission medications   Medication Sig Start Date End Date Taking? Authorizing  Provider  aspirin EC 81 MG tablet Take 81 mg by mouth daily.    [provider]  levETIRAcetam (KEPPRA) 500 MG tablet TAKE  (1)  TABLET TWICE A DAY. 01/11/17   Micki Riley, MD  omeprazole (PRILOSEC) 20 MG capsule Take 20 mg by mouth daily.    [provider]    Allergies    Patient has no known allergies.  Review of Systems   Review of Systems  Constitutional: Positive for chills and fever.  HENT: Positive for congestion. Negative for sore throat.   Respiratory: Positive for cough. Negative for shortness of breath.   Cardiovascular: Negative for chest pain.  Gastrointestinal: Positive for abdominal pain, diarrhea, nausea and vomiting.  Genitourinary: Negative for enuresis.  Musculoskeletal: Negative for back pain and myalgias.  Skin: Negative for rash.  Neurological: Negative for dizziness and headaches.  Hematological: Does not bruise/bleed easily.    Physical Exam Updated Vital Signs BP (!) 181/83 (BP Location: Left Arm) Comment: told rn bp up  Pulse 70   Temp 98.3 F (36.8 C) (Oral)   Resp 20   Ht 5\' 1"  (1.549 m)   Wt 64.7 kg   SpO2 99%   BMI 26.95 kg/m   Physical Exam Vitals and nursing note reviewed.  Constitutional:      General: She is not in acute distress.    Appearance: She is not ill-appearing.  HENT:     Head: Normocephalic and atraumatic.     Nose: No congestion.     Mouth/Throat:     Mouth: Mucous membranes are dry.     Pharynx: Oropharynx is clear. No oropharyngeal exudate or posterior oropharyngeal erythema.  Eyes:     Conjunctiva/sclera: Conjunctivae normal.  Cardiovascular:     Rate and Rhythm: Normal rate and regular rhythm.     Pulses: Normal pulses.     Heart sounds: No murmur heard. No friction rub. No gallop.   Pulmonary:     Effort: No respiratory distress.     Breath sounds: Wheezing present. No rhonchi or rales.     Comments: Patient slight expiratory wheezing on my exam bilaterally in the lower lobes. Abdominal:      Palpations: Abdomen is soft.     Tenderness: There is abdominal tenderness. There is left CVA tenderness. There is no right CVA tenderness.     Comments:  abdomen was nondistended, tender in her lower quadrants, worse on the left versus the right, negative guarding, peritoneal sign, rebound tenderness.  Positive left CVA tenderness.  Musculoskeletal:     Right lower leg: No edema.     Left lower leg: No edema.  Skin:    General: Skin is  warm and dry.  Neurological:     Mental Status: She is alert.  Psychiatric:        Mood and Affect: Mood normal.     ED Results / Procedures / Treatments   Labs (all labs ordered are listed, but only abnormal results are displayed) Labs Reviewed  CBC WITH DIFFERENTIAL/PLATELET - Abnormal; Notable for the following components:      Result Value   WBC 13.8 (*)    RBC 5.17 (*)    Hemoglobin 16.5 (*)    HCT 49.1 (*)    Neutro Abs 10.9 (*)    All other components within normal limits  COMPREHENSIVE METABOLIC PANEL - Abnormal; Notable for the following components:   Potassium 2.5 (*)    Chloride 89 (*)    CO2 33 (*)    Glucose, Bld 127 (*)    BUN 27 (*)    Total Protein 8.2 (*)    AST 14 (*)    All other components within normal limits  URINALYSIS, ROUTINE W REFLEX MICROSCOPIC - Abnormal; Notable for the following components:   Hgb urine dipstick MODERATE (*)    Ketones, ur 20 (*)    Protein, ur 30 (*)    All other components within normal limits  RESP PANEL BY RT-PCR (FLU A&B, COVID) ARPGX2  MAGNESIUM  HIV ANTIBODY (ROUTINE TESTING W REFLEX)  BASIC METABOLIC PANEL  CBC    EKG EKG Interpretation  Date/Time:  Tuesday January 26 2021 13:47:18 EDT Ventricular Rate:  66 PR Interval:  130 QRS Duration: 68 QT Interval:  472 QTC Calculation: 495 R Axis:   78 Text Interpretation: Sinus rhythm Consider left ventricular hypertrophy Nonspecific T abnormalities Borderline prolonged QT interval Confirmed by Cathren Laine ([id number]) on 01/26/2021  1:51:10 PM   Radiology CT Abdomen Pelvis Wo Contrast  Result Date: 01/26/2021 CLINICAL DATA:  Left-sided abdominal pain EXAM: CT ABDOMEN AND PELVIS WITHOUT CONTRAST TECHNIQUE: Multidetector CT imaging of the abdomen and pelvis was performed following the standard protocol without IV contrast. COMPARISON:  None. FINDINGS: Lower chest: No acute abnormality. Hepatobiliary: No focal liver abnormality is seen. No gallstones, gallbladder wall thickening, or biliary dilatation. Pancreas: Unremarkable. Spleen: Unremarkable. Adrenals/Urinary Tract: Adrenals, kidneys, and partially distended bladder are unremarkable. Stomach/Bowel: Stomach is within normal limits. Bowel is normal in caliber. Normal appendix. Vascular/Lymphatic: Aortic atherosclerosis. No enlarged lymph nodes. Reproductive: Uterus and bilateral adnexa are unremarkable. Other: No free fluid.  Abdominal wall is unremarkable. Musculoskeletal: Degenerative changes of the included spine. No acute osseous abnormality. IMPRESSION: No acute abnormality. Electronically Signed   By: Guadlupe Spanish M.D.   On: 01/26/2021 14:24   DG Chest 1 View  Result Date: 01/26/2021 CLINICAL DATA:  Cough and weakness. EXAM: CHEST  1 VIEW COMPARISON:  Chest x-ray 09/07/2015, 09/05/2015. FINDINGS: Mediastinum and hilar structures normal. Lungs are clear. No pleural effusion or pneumothorax. Heart size normal. Degenerative changes scoliosis thoracic spine. IMPRESSION: No acute cardiopulmonary disease. Electronically Signed   By: Maisie Fus  Register   On: 01/26/2021 14:35    Procedures Procedures   Medications Ordered in ED Medications  enoxaparin (LOVENOX) injection 40 mg (40 mg Subcutaneous Given 01/26/21 2114)  0.9 % NaCl with KCl 40 mEq / L  infusion ( Intravenous New Bag/Given 01/26/21 1855)  ondansetron (ZOFRAN) tablet 4 mg (has no administration in time range)    Or  ondansetron (ZOFRAN) injection 4 mg (has no administration in time range)  pantoprazole (PROTONIX)  injection 40 mg (40 mg Intravenous Given 01/26/21 2113)  levETIRAcetam (KEPPRA) IVPB 500 mg/100 mL premix (500 mg Intravenous New Bag/Given 01/26/21 2121)  nicotine (NICODERM CQ - dosed in mg/24 hours) patch 14 mg (14 mg Transdermal Patch Applied 01/26/21 2127)  hydrALAZINE (APRESOLINE) injection 10 mg (has no administration in time range)  potassium chloride 10 mEq in 100 mL IVPB (10 mEq Intravenous New Bag/Given 01/26/21 1834)  sodium chloride 0.9 % bolus 1,000 mL (0 mLs Intravenous Stopped 01/26/21 1600)  promethazine (PHENERGAN) 12.5 mg in sodium chloride 0.9 % 50 mL IVPB (0 mg Intravenous Stopped 01/26/21 1537)  pantoprazole (PROTONIX) injection 40 mg (40 mg Intravenous Given 01/26/21 1503)    ED Course  I have reviewed the triage vital signs and the nursing notes.  Pertinent labs & imaging results that were available during my care of the patient were reviewed by me and considered in my medical decision making (see chart for details).    MDM Rules/Calculators/A&P                         Initial impression-patient presents with nausea, vomiting, abdominal pain.  She is alert, does not appear in acute distress, vital signs reassuring.  Will obtain basic lab work-up and reassess.  Work-up-CBC shows leukocytosis of 13.8, CMP shows hypokalemia of 2.5, elevated CO2 of 33, glucose of 127, magnesium 2.4.  CT abdomen pelvis negative for acute findings.  EKG sinus without signs of ischemia, does show slightly prolongated QT.  Reassessment-patient was reassessed, she continues to feel unwell and weak, her nausea has resolved.  Recommend admission due to extremely low potassium patient is agreeable to this.  Will consult with hospitalist for admission  Consult Spoke with Dr. Joylene Igo of the hospitalist team she has accepted the patient for admission  Rule out-I have low suspicion for arrhythmias as patient denies chest pain, shortness of breath, orthopnea or worsening pedal edema, EKG is without signs of  ischemia, slightly elongated QT suspect this secondary due to hypokalemia.  Low suspicion for pneumoperitoneum as abdomen is nondistended, dull to percussion, no peritoneal sign present my exam.  I have low suspicion for kidney stone or pyelonephritis as there is no noted stone seen on CT scan, no stranding around the kidneys.  Low suspicion for systemic infection as patient is nontoxic-appearing, vital signs reassuring.  Patient does have noted leukocytosis this could be secondary due to acute phase reactant as well as being dehydrated or possible that she might be suffering a UTI she does endorse some urinary symptoms.  Plan-admit to medicine secondary due to hypokalemia.     Final Clinical Impression(s) / ED Diagnoses Final diagnoses:  Non-intractable vomiting with nausea, unspecified vomiting type  Hypokalemia    Rx / DC Orders ED Discharge Orders    None       Barnie Del 01/26/21 2138    Cathren Laine, MD 01/29/21 1531

## 2021-01-26 NOTE — Plan of Care (Signed)

## 2021-01-27 DIAGNOSIS — K219 Gastro-esophageal reflux disease without esophagitis: Secondary | ICD-10-CM

## 2021-01-27 LAB — BASIC METABOLIC PANEL
Anion gap: 8 (ref 5–15)
BUN: 12 mg/dL (ref 6–20)
CO2: 25 mmol/L (ref 22–32)
Calcium: 8.8 mg/dL — ABNORMAL LOW (ref 8.9–10.3)
Chloride: 101 mmol/L (ref 98–111)
Creatinine, Ser: 0.46 mg/dL (ref 0.44–1.00)
GFR, Estimated: >60 mL/min (ref >60)
Glucose, Bld: 101 mg/dL — ABNORMAL HIGH (ref 70–99)
Potassium: 3.2 mmol/L — ABNORMAL LOW (ref 3.5–5.1)
Sodium: 134 mmol/L — ABNORMAL LOW (ref 135–145)

## 2021-01-27 LAB — CBC
HCT: 44.7 % (ref 36.0–46.0)
Hemoglobin: 14.8 g/dL (ref 12.0–15.0)
MCH: 31.6 pg (ref 26.0–34.0)
MCHC: 33.1 g/dL (ref 30.0–36.0)
MCV: 95.5 fL (ref 80.0–100.0)
Platelets: 179 10*3/uL (ref 150–400)
RBC: 4.68 MIL/uL (ref 3.87–5.11)
RDW: 13.5 % (ref 11.5–15.5)
WBC: 10.5 10*3/uL (ref 4.0–10.5)
nRBC: 0 % (ref 0.0–0.2)

## 2021-01-27 MED ORDER — PANTOPRAZOLE SODIUM 40 MG PO TBEC
40.0000 mg | DELAYED_RELEASE_TABLET | Freq: Every day | ORAL | Status: DC
  Administered 2021-01-27: 40 mg via ORAL
  Filled 2021-01-27: qty 1

## 2021-01-27 MED ORDER — NICOTINE 14 MG/24HR TD PT24: 14.0000 mg | MEDICATED_PATCH | Freq: Every day | TRANSDERMAL | 0 refills | Status: DC

## 2021-01-27 MED ORDER — LEVETIRACETAM 500 MG PO TABS: 500.0000 mg | ORAL_TABLET | Freq: Two times a day (BID) | ORAL | 2 refills | Status: DC

## 2021-01-27 MED ORDER — ONDANSETRON 8 MG PO TBDP: 8.0000 mg | ORAL_TABLET | Freq: Three times a day (TID) | ORAL | 0 refills | Status: AC | PRN

## 2021-01-27 MED ORDER — OMEPRAZOLE 20 MG PO CPDR: 20.0000 mg | DELAYED_RELEASE_CAPSULE | Freq: Two times a day (BID) | ORAL | 2 refills | Status: DC

## 2021-01-27 MED ORDER — POTASSIUM CHLORIDE CRYS ER 20 MEQ PO TBCR: 20.0000 meq | EXTENDED_RELEASE_TABLET | Freq: Every day | ORAL | 2 refills | Status: DC

## 2021-01-27 MED ORDER — LEVETIRACETAM 500 MG PO TABS: 500.0000 mg | ORAL_TABLET | Freq: Two times a day (BID) | ORAL | Status: DC

## 2021-01-27 MED ORDER — POTASSIUM CHLORIDE CRYS ER 20 MEQ PO TBCR
40.0000 meq | EXTENDED_RELEASE_TABLET | ORAL | Status: AC
  Administered 2021-01-27 (×2): 40 meq via ORAL
  Filled 2021-01-27 (×2): qty 2

## 2021-01-27 NOTE — Plan of Care (Signed)

## 2021-01-27 NOTE — Discharge Summary (Signed)
Physician Discharge Summary  Danielle Washington HFW:[id number] DOB: Sep 12, 1975 DOA: 01/26/2021  PCP: Randell Patient [facility]  Admit date: 01/26/2021 Discharge date: 01/27/2021  Time spent: minutes  Recommendations for Outpatient Follow-up:  1. Repeat basic metabolic panel to follow electrolytes and renal function 2. Continue assisting patient with tobacco cessation   Discharge Diagnoses:  Principal Problem:   Hypokalemia due to excessive gastrointestinal loss of potassium Active Problems:   Stroke of unknown etiology (HCC)   Tobacco abuse   GERD (gastroesophageal reflux disease)   Seizures, generalized convulsive (HCC)   Acute gastritis   Discharge Condition: Stable and improved.  Discharged home with instruction to follow-up with PCP in 10 days.  CODE STATUS: Full code  Diet recommendation: Heart healthy diet.  Filed Weights   01/26/21 1700  Weight: 64.7 kg    History of present illness:  As per H&P written by Dr. Joylene Igo on 01/26/2021 Danielle Washington is a 45 y.o. female with medical history significant for GERD, hypertension, seizures who presents to the ER for evaluation of nausea, vomiting and inability to tolerate any oral intake. Patient states her symptoms started 2 days ago and have been persistent since then.  Her symptoms are associated with abdominal pain mostly in the suprapubic area that is nonradiating.  She denies having any urinary frequency, nocturia or dysuria at this time.  She had symptoms a week ago took over-the-counter Azo with improvement in her symptoms.  She had 1 episode of diarrhea which has resolved. Patient states that she was recently exposed to 2 of her coworkers that tested positive for the COVID-19 virus.  She is unvaccinated against COVID or influenza. She has had some low-grade fever and myalgias but denies having any cough, no shortness of breath, no chest pain, no dizziness, no lightheadedness, no headache, no focal deficits, no back pain, no  blurred vision. Labs show sodium 136, potassium 2.5, chloride 89, bicarb 33, glucose 127, BUN 27, creatinine 0.81, calcium 9.7, magnesium 2.4, alkaline phosphatase 90, albumin 4.5, AST 14, ALT 10, total protein 8.2, white count 13.8, hemoglobin 16.5, hematocrit 49.1, MCV 95, RDW 13.5, platelet count 163 Urinalysis is sterile CT scan of abdomen and pelvis shows no acute abnormality. Chest x-ray reviewed by me shows no acute cardiopulmonary disease Her respiratory viral panel is still pending  ED Course: Patient is a 45 year old female who presents to the ER for evaluation of a 2-day history of nausea, vomiting and inability to tolerate any oral intake. She has had low-grade fever and myalgias and admits to recent exposure to coworkers who tested positive for COVID-19 virus. Her respiratory viral panel is still pending but labs reveal hypokalemia. She will be admitted to the hospital for further evaluation.  Hospital Course:  Acute gastroenteritis -Probably associated with viral infection. -Symptoms significantly improved/resolved at time of discharge -continue to maintain adequate hydration -Resume the use of PPI twice a day -As needed antiemetics medication prescribed at discharge.  Hypokalemia -In the setting of GI losses and decreased oral intake -Magnesium within normal limits -Potassium has been repleted -Daily supplementation provided at discharge.  History of tobacco abuse -Cessation counseling provided -Patient advised to use nicotine patch and to stop smoking  History of seizure disorder -Continue Keppra   Procedures:  See below for x-ray reports.  Consultations:  None  Discharge Exam: Vitals:   01/27/21 0149 01/27/21 0455  BP: (!) 176/84 (!) 172/104  Pulse: 79 83  Resp: 16 18  Temp: 98.4 F (36.9 C) 98.5 F (36.9  C)  SpO2: 96% 100%    General: Afebrile, no chest pain, no nausea, no vomiting.  Tolerating diet and feeling ready to go  home. Cardiovascular: S1 and S2, no rubs, no gallops, no JVD. Respiratory: Clear to auscultation bilaterally Abdomen: Soft, nontender, nondistended, bowel sounds Extremities: No cyanosis or clubbing.  Discharge Instructions   Discharge Instructions    Diet - low sodium heart healthy   Complete by: As directed    Discharge instructions   Complete by: As directed    Take medications as prescribed Maintain adequate hydration Arrange follow up with PCP in 10 days.     Allergies as of 01/27/2021   No Known Allergies     Medication List    TAKE these medications   aspirin EC 81 MG tablet Take 81 mg by mouth daily.   levETIRAcetam 500 MG tablet Commonly known as: KEPPRA Take 1 tablet (500 mg total) by mouth 2 (two) times daily.   nicotine 14 mg/24hr patch Commonly known as: NICODERM CQ - dosed in mg/24 hours Place 1 patch (14 mg total) onto the skin daily. Start taking on: January 28, 2021   omeprazole 20 MG capsule Commonly known as: PRILOSEC Take 1 capsule (20 mg total) by mouth 2 (two) times daily before a meal. What changed: when to take this   ondansetron 8 MG disintegrating tablet Commonly known as: Zofran ODT Take 1 tablet (8 mg total) by mouth every 8 (eight) hours as needed for nausea or vomiting.   potassium chloride SA 20 MEQ tablet Commonly known as: KLOR-CON Take 1 tablet (20 mEq total) by mouth daily.      No Known Allergies  Follow-up Information    [facility], [facility]. Schedule an appointment as soon as possible for a visit in 10 day(s).   Contact information: [address] [place] [address] [phone]                The results of significant diagnostics from this hospitalization (including imaging, microbiology, ancillary and laboratory) are listed below for reference.    Significant Diagnostic Studies: CT Abdomen Pelvis Wo Contrast  Result Date: 01/26/2021 CLINICAL DATA:  Left-sided abdominal pain EXAM: CT ABDOMEN AND  PELVIS WITHOUT CONTRAST TECHNIQUE: Multidetector CT imaging of the abdomen and pelvis was performed following the standard protocol without IV contrast. COMPARISON:  None. FINDINGS: Lower chest: No acute abnormality. Hepatobiliary: No focal liver abnormality is seen. No gallstones, gallbladder wall thickening, or biliary dilatation. Pancreas: Unremarkable. Spleen: Unremarkable. Adrenals/Urinary Tract: Adrenals, kidneys, and partially distended bladder are unremarkable. Stomach/Bowel: Stomach is within normal limits. Bowel is normal in caliber. Normal appendix. Vascular/Lymphatic: Aortic atherosclerosis. No enlarged lymph nodes. Reproductive: Uterus and bilateral adnexa are unremarkable. Other: No free fluid.  Abdominal wall is unremarkable. Musculoskeletal: Degenerative changes of the included spine. No acute osseous abnormality. IMPRESSION: No acute abnormality. Electronically Signed   By: Guadlupe Spanish M.D.   On: 01/26/2021 14:24   DG Chest 1 View  Result Date: 01/26/2021 CLINICAL DATA:  Cough and weakness. EXAM: CHEST  1 VIEW COMPARISON:  Chest x-ray 09/07/2015, 09/05/2015. FINDINGS: Mediastinum and hilar structures normal. Lungs are clear. No pleural effusion or pneumothorax. Heart size normal. Degenerative changes scoliosis thoracic spine. IMPRESSION: No acute cardiopulmonary disease. Electronically Signed   By: Maisie Fus  Register   On: 01/26/2021 14:35    Microbiology: Recent Results (from the past 240 hour(s))  Resp Panel by RT-PCR (Flu A&B, Covid) Nasopharyngeal Swab     Status: None  Collection Time: 01/26/21  1:46 PM   Specimen: Nasopharyngeal Swab; Nasopharyngeal(NP) swabs in vial transport medium  Result Value Ref Range Status   SARS Coronavirus 2 by RT PCR NEGATIVE NEGATIVE Final    Comment: (NOTE) SARS-CoV-2 target nucleic acids are NOT DETECTED.  The SARS-CoV-2 RNA is generally detectable in upper respiratory specimens during the acute phase of infection. The lowest concentration of  SARS-CoV-2 viral copies this assay can detect is 138 copies/mL. A negative result does not preclude SARS-Cov-2 infection and should not be used as the sole basis for treatment or other patient management decisions. A negative result may occur with  improper specimen collection/handling, submission of specimen other than nasopharyngeal swab, presence of viral mutation(s) within the areas targeted by this assay, and inadequate number of viral copies(<138 copies/mL). A negative result must be combined with clinical observations, patient history, and epidemiological information. The expected result is Negative.  Fact Sheet for Patients:  BloggerCourse.com  Fact Sheet for Healthcare Providers:  SeriousBroker.it  This test is no t yet approved or cleared by the [place] FDA and  has been authorized for detection and/or diagnosis of SARS-CoV-2 by FDA under an Emergency Use Authorization (EUA). This EUA will remain  in effect (meaning this test can be used) for the duration of the COVID-19 declaration under Section 564(b)(1) of the Act, 21 U.S.C.section 360bbb-3(b)(1), unless the authorization is terminated  or revoked sooner.       Influenza A by PCR NEGATIVE NEGATIVE Final   Influenza B by PCR NEGATIVE NEGATIVE Final    Comment: (NOTE) The Xpert Xpress SARS-CoV-2/FLU/RSV plus assay is intended as an aid in the diagnosis of influenza from Nasopharyngeal swab specimens and should not be used as a sole basis for treatment. Nasal washings and aspirates are unacceptable for Xpert Xpress SARS-CoV-2/FLU/RSV testing.  Fact Sheet for Patients: BloggerCourse.com  Fact Sheet for Healthcare Providers: SeriousBroker.it  This test is not yet approved or cleared by the [place] FDA and has been authorized for detection and/or diagnosis of SARS-CoV-2 by FDA under an Emergency Use  Authorization (EUA). This EUA will remain in effect (meaning this test can be used) for the duration of the COVID-19 declaration under Section 564(b)(1) of the Act, 21 U.S.C. section 360bbb-3(b)(1), unless the authorization is terminated or revoked.  Performed at [facility], [address] [place], [place] [address]      Labs: Basic Metabolic Panel: Recent Labs  Lab 01/26/21 1205 01/27/21 0542  NA 136 134*  K 2.5* 3.2*  CL 89* 101  CO2 33* 25  GLUCOSE 127* 101*  BUN 27* 12  CREATININE 0.81 0.46  CALCIUM 9.7 8.8*  MG 2.4  --    Liver Function Tests: Recent Labs  Lab 01/26/21 1205  AST 14*  ALT 10  ALKPHOS 90  BILITOT 0.9  PROT 8.2*  ALBUMIN 4.5   CBC: Recent Labs  Lab 01/26/21 1205 01/27/21 0542  WBC 13.8* 10.5  NEUTROABS 10.9*  --   HGB 16.5* 14.8  HCT 49.1* 44.7  MCV 95.0 95.5  PLT 163 179    Signed:  Vassie Loll MD.  [facility] 01/27/2021, 1:10 PM

## 2021-10-22 IMAGING — CT CT ABD-PELV W/O CM
2 of 4 series · 17 of 46 positions shown, 19 images · non-contrast
Comparison: None.

CLINICAL DATA: Left-sided abdominal pain

EXAM:
CT ABDOMEN AND PELVIS WITHOUT CONTRAST
TECHNIQUE: Multidetector CT imaging of the abdomen and pelvis was performed
following the standard protocol without IV contrast.

[Series 2: axial st · axial · 0.85mm/px · z∈[+678,+1078]mm · 14 of 91 slices shown, 16 images]
[im 6/91  soft-tissue]
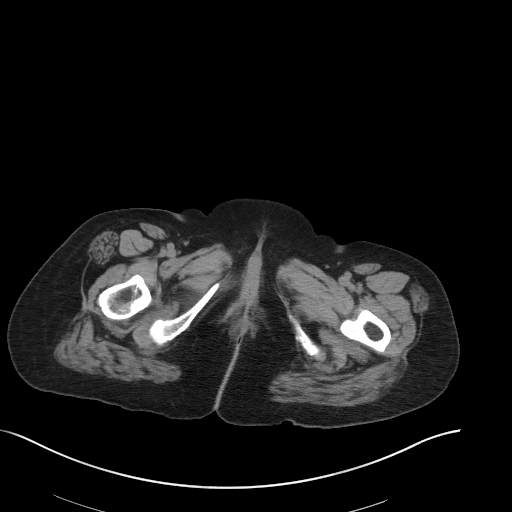
[im 6/91  bone]
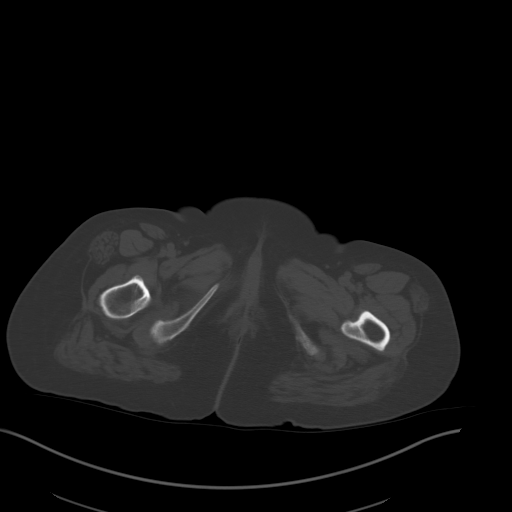
[im 11/91  soft-tissue]
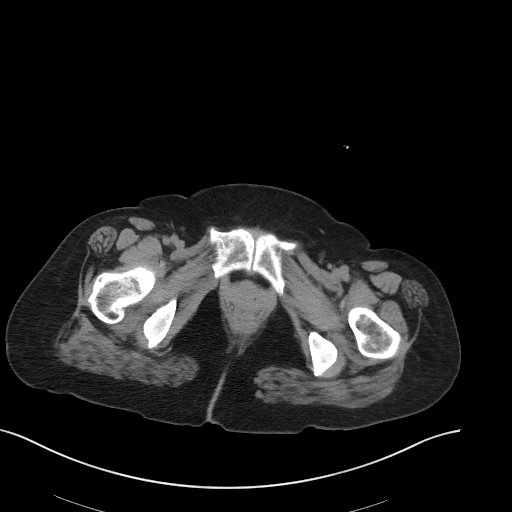
[im 21/91  soft-tissue]
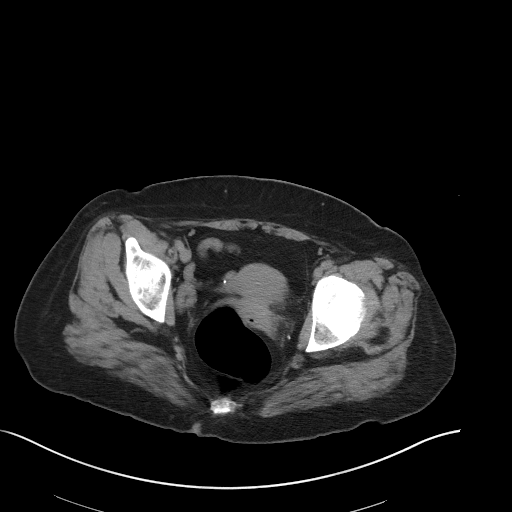
[im 26/91  soft-tissue]
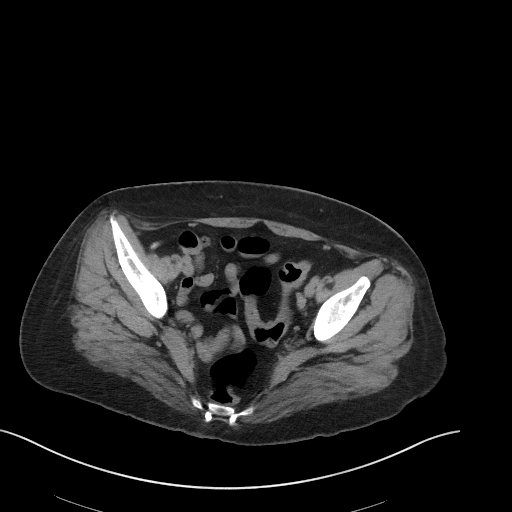
[im 31/91  soft-tissue]
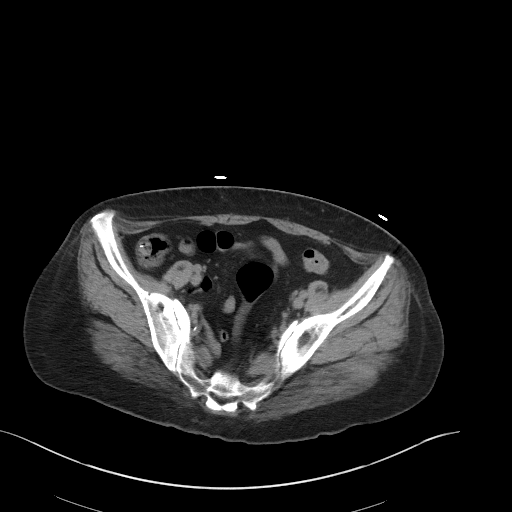
[im 36/91  soft-tissue]
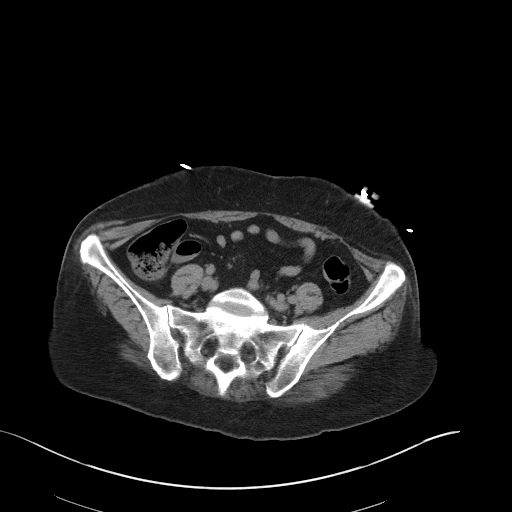
[im 41/91  soft-tissue]
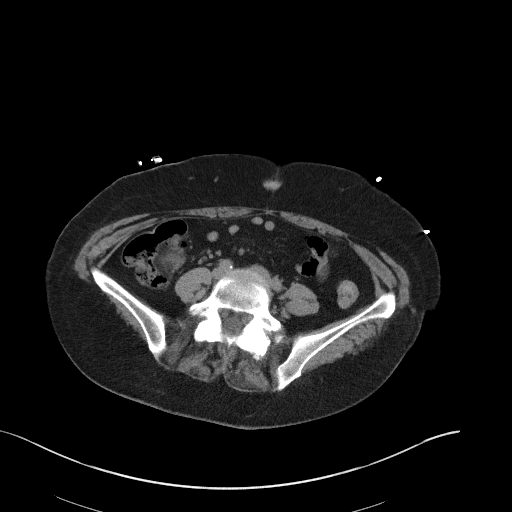
[im 51/91  soft-tissue]
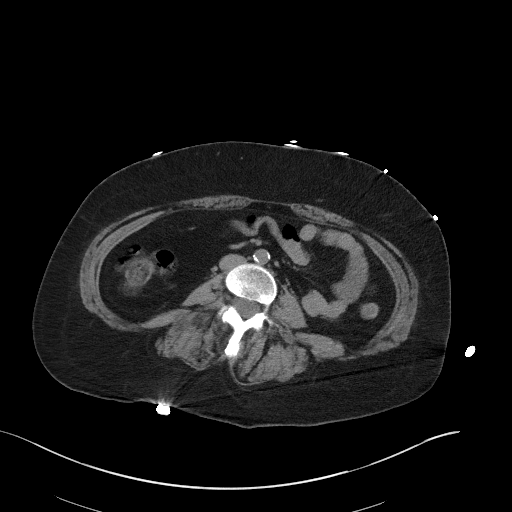
[im 56/91  soft-tissue]
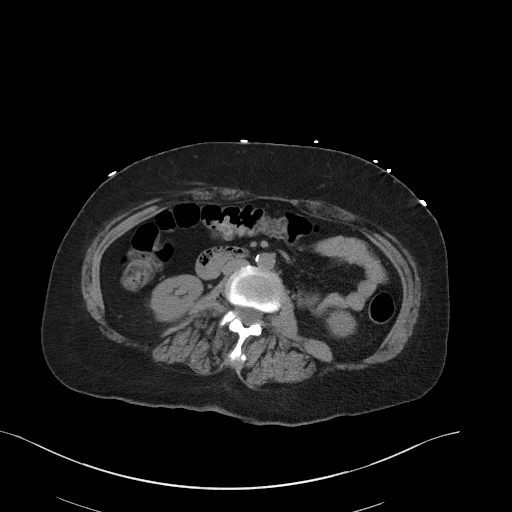
[im 56/91  bone]
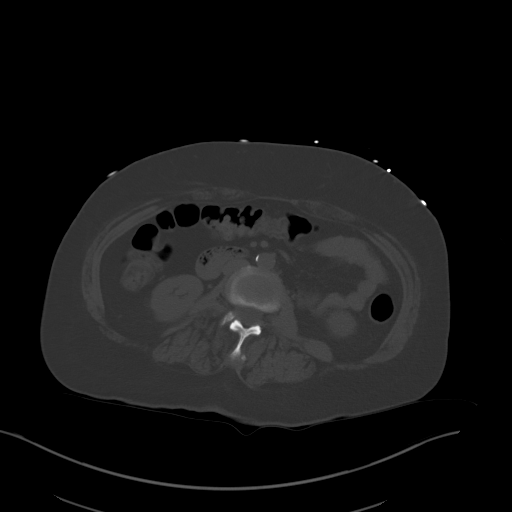
[im 61/91  soft-tissue]
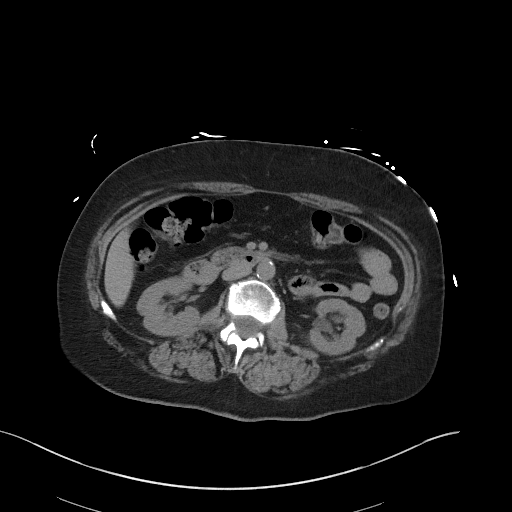
[im 66/91  soft-tissue]
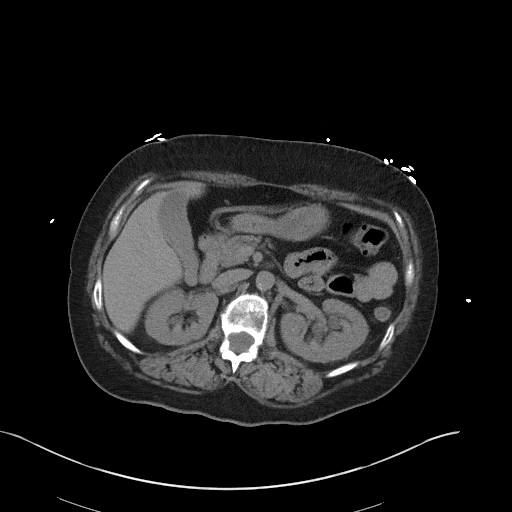
[im 71/91  soft-tissue]
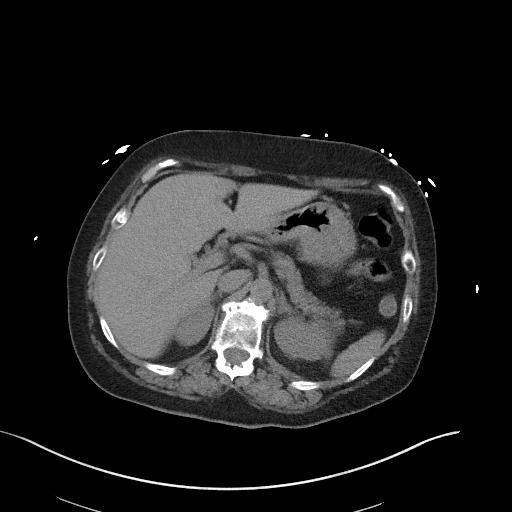
[im 81/91  soft-tissue]
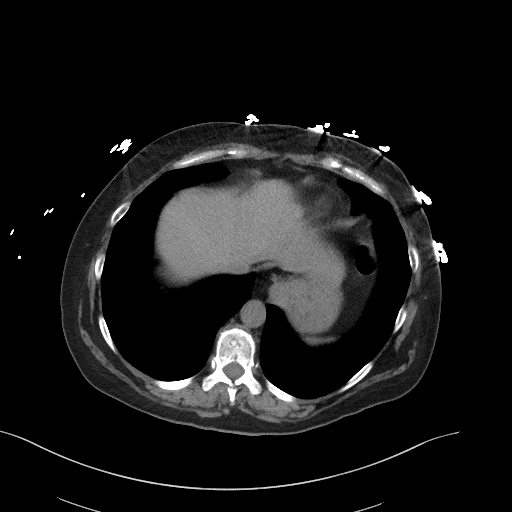
[im 86/91  soft-tissue]
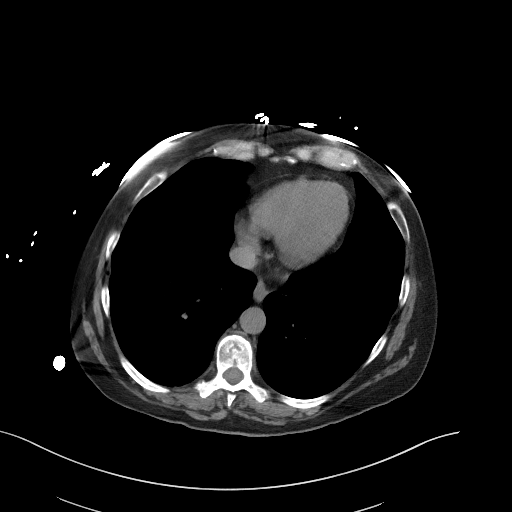

[Series 5: coronal st · coronal · 0.78mm/px · 3 of 109 slices shown]
[im 37/109  soft-tissue]
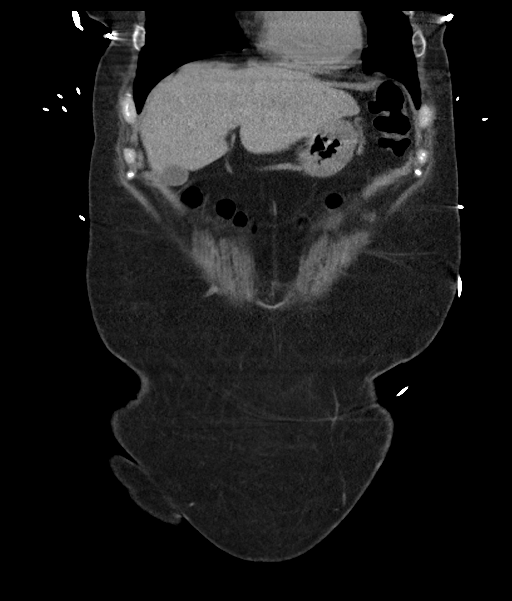
[im 49/109  soft-tissue]
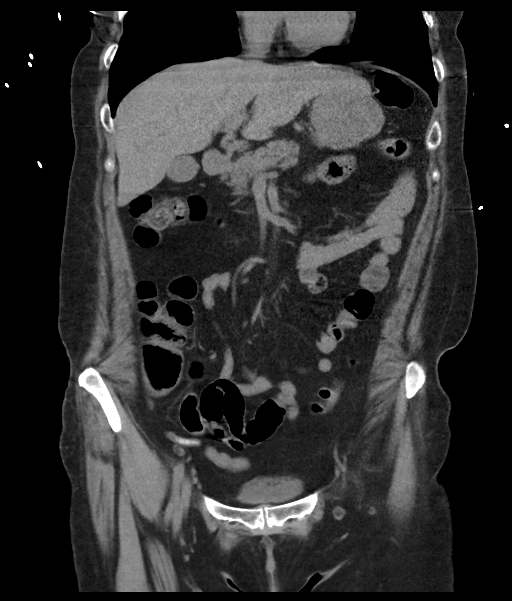
[im 61/109  soft-tissue]
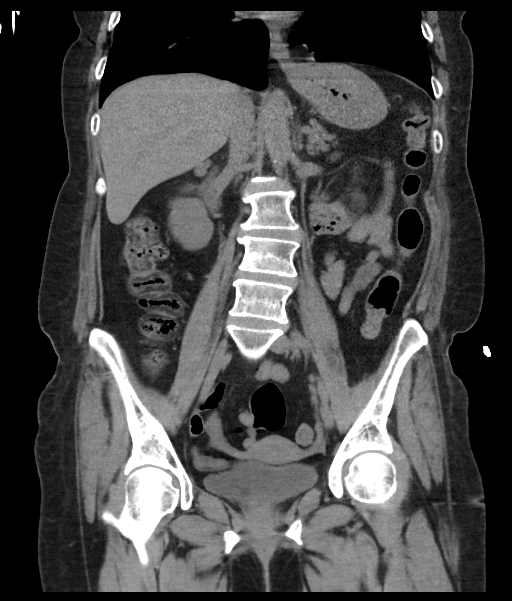

[17 of 46 positions shown; findings below may reference images not displayed]

FINDINGS: Lower chest: No acute abnormality.

Hepatobiliary: No focal liver abnormality is seen. No gallstones,
gallbladder wall thickening, or biliary dilatation.

Pancreas: Unremarkable.

Spleen: Unremarkable.

Adrenals/Urinary Tract: Adrenals, kidneys, and partially distended
bladder are unremarkable.

Stomach/Bowel: Stomach is within normal limits. Bowel is normal in
caliber. Normal appendix.

Vascular/Lymphatic: Aortic atherosclerosis. No enlarged lymph nodes.

Reproductive: Uterus and bilateral adnexa are unremarkable.

Other: No free fluid.  Abdominal wall is unremarkable.

Musculoskeletal: Degenerative changes of the included spine. No
acute osseous abnormality.
IMPRESSION: No acute abnormality.

## 2023-07-01 ENCOUNTER — Other Ambulatory Visit: Payer: Self-pay

## 2023-07-01 ENCOUNTER — Emergency Department (HOSPITAL_COMMUNITY): Payer: Medicaid Other

## 2023-07-01 ENCOUNTER — Emergency Department (HOSPITAL_COMMUNITY)
Admission: EM | Admit: 2023-07-01 | Discharge: 2023-07-01 | Disposition: A | Discharge status: Home / Self Care | Payer: Medicaid Other | Attending: Emergency Medicine | Admitting: Emergency Medicine

## 2023-07-01 DIAGNOSIS — Z7982 Long term (current) use of aspirin: Secondary | ICD-10-CM | POA: Diagnosis not present

## 2023-07-01 DIAGNOSIS — I10 Essential (primary) hypertension: Secondary | ICD-10-CM | POA: Diagnosis not present

## 2023-07-01 DIAGNOSIS — R519 Headache, unspecified: Secondary | ICD-10-CM | POA: Diagnosis present

## 2023-07-01 DIAGNOSIS — E876 Hypokalemia: Secondary | ICD-10-CM | POA: Diagnosis not present

## 2023-07-01 DIAGNOSIS — R112 Nausea with vomiting, unspecified: Secondary | ICD-10-CM

## 2023-07-01 DIAGNOSIS — R1084 Generalized abdominal pain: Secondary | ICD-10-CM | POA: Diagnosis not present

## 2023-07-01 DIAGNOSIS — R109 Unspecified abdominal pain: Secondary | ICD-10-CM | POA: Diagnosis not present

## 2023-07-01 DIAGNOSIS — Z79899 Other long term (current) drug therapy: Secondary | ICD-10-CM | POA: Diagnosis not present

## 2023-07-01 DIAGNOSIS — R0689 Other abnormalities of breathing: Secondary | ICD-10-CM | POA: Diagnosis not present

## 2023-07-01 LAB — CBC WITH DIFFERENTIAL/PLATELET
Abs Immature Granulocytes: 0.01 10*3/uL (ref 0.00–0.07)
Basophils Absolute: 0 10*3/uL (ref 0.0–0.1)
Basophils Relative: 0 %
Eosinophils Absolute: 0 10*3/uL (ref 0.0–0.5)
Eosinophils Relative: 0 %
HCT: 39.2 % (ref 36.0–46.0)
Hemoglobin: 13 g/dL (ref 12.0–15.0)
Immature Granulocytes: 0 %
Lymphocytes Relative: 11 %
Lymphs Abs: 0.6 10*3/uL — ABNORMAL LOW (ref 0.7–4.0)
MCH: 30.2 pg (ref 26.0–34.0)
MCHC: 33.2 g/dL (ref 30.0–36.0)
MCV: 91 fL (ref 80.0–100.0)
Monocytes Absolute: 0.3 10*3/uL (ref 0.1–1.0)
Monocytes Relative: 6 %
Neutro Abs: 4.1 10*3/uL (ref 1.7–7.7)
Neutrophils Relative %: 83 %
Platelets: 333 10*3/uL (ref 150–400)
RBC: 4.31 MIL/uL (ref 3.87–5.11)
RDW: 12.9 % (ref 11.5–15.5)
WBC: 5 10*3/uL (ref 4.0–10.5)
nRBC: 0 % (ref 0.0–0.2)

## 2023-07-01 LAB — URINALYSIS, ROUTINE W REFLEX MICROSCOPIC
Bilirubin Urine: NEGATIVE
Glucose, UA: 50 mg/dL — AB
Hgb urine dipstick: NEGATIVE
Ketones, ur: 20 mg/dL — AB
Leukocytes,Ua: NEGATIVE
Nitrite: NEGATIVE
Protein, ur: NEGATIVE mg/dL
Specific Gravity, Urine: 1.008 (ref 1.005–1.030)
pH: 9 — ABNORMAL HIGH (ref 5.0–8.0)

## 2023-07-01 LAB — LIPASE, BLOOD: Lipase: 27 U/L (ref 11–51)

## 2023-07-01 LAB — COMPREHENSIVE METABOLIC PANEL
ALT: 12 U/L (ref 0–44)
AST: 15 U/L (ref 15–41)
Albumin: 3.3 g/dL — ABNORMAL LOW (ref 3.5–5.0)
Alkaline Phosphatase: 55 U/L (ref 38–126)
Anion gap: 10 (ref 5–15)
BUN: 8 mg/dL (ref 6–20)
CO2: 33 mmol/L — ABNORMAL HIGH (ref 22–32)
Calcium: 8.8 mg/dL — ABNORMAL LOW (ref 8.9–10.3)
Chloride: 96 mmol/L — ABNORMAL LOW (ref 98–111)
Creatinine, Ser: 0.59 mg/dL (ref 0.44–1.00)
GFR, Estimated: >60 mL/min (ref >60)
Glucose, Bld: 142 mg/dL — ABNORMAL HIGH (ref 70–99)
Potassium: 2.3 mmol/L — CL (ref 3.5–5.1)
Sodium: 139 mmol/L (ref 135–145)
Total Bilirubin: 0.4 mg/dL (ref <1.2)
Total Protein: 6.5 g/dL (ref 6.5–8.1)

## 2023-07-01 LAB — TROPONIN I (HIGH SENSITIVITY)
Troponin I (High Sensitivity): 12 ng/L (ref <18)
Troponin I (High Sensitivity): 15 ng/L (ref <18)

## 2023-07-01 LAB — POTASSIUM: Potassium: 3 mmol/L — ABNORMAL LOW (ref 3.5–5.1)

## 2023-07-01 MED ORDER — POTASSIUM CHLORIDE 10 MEQ/100ML IV SOLN
10.0000 meq | INTRAVENOUS | Status: AC
Start: 2023-07-01 — End: 2023-07-01
  Administered 2023-07-01 (×2): 10 meq via INTRAVENOUS
  Filled 2023-07-01 (×2): qty 100

## 2023-07-01 MED ORDER — MAGNESIUM SULFATE 2 GM/50ML IV SOLN
2.0000 g | Freq: Once | INTRAVENOUS | Status: AC
  Administered 2023-07-01: 2 g via INTRAVENOUS
  Filled 2023-07-01: qty 50

## 2023-07-01 MED ORDER — POTASSIUM CHLORIDE CRYS ER 20 MEQ PO TBCR
40.0000 meq | EXTENDED_RELEASE_TABLET | Freq: Once | ORAL | Status: AC
Start: 2023-07-01 — End: 2023-07-01
  Administered 2023-07-01: 40 meq via ORAL
  Filled 2023-07-01: qty 2

## 2023-07-01 MED ORDER — HYDRALAZINE HCL 20 MG/ML IJ SOLN
10.0000 mg | INTRAMUSCULAR | Status: AC
  Administered 2023-07-01: 10 mg via INTRAVENOUS
  Filled 2023-07-01: qty 1

## 2023-07-01 MED ORDER — IOHEXOL 300 MG/ML  SOLN
100.0000 mL | Freq: Once | INTRAMUSCULAR | Status: AC | PRN
Start: 2023-07-01 — End: 2023-07-01
  Administered 2023-07-01: 100 mL via INTRAVENOUS

## 2023-07-01 MED ORDER — ONDANSETRON HCL 4 MG/2ML IJ SOLN
4.0000 mg | Freq: Once | INTRAMUSCULAR | Status: AC
  Administered 2023-07-01: 4 mg via INTRAVENOUS
  Filled 2023-07-01: qty 2

## 2023-07-01 MED ORDER — HYDROCHLOROTHIAZIDE 12.5 MG PO TABS
12.5000 mg | ORAL_TABLET | Freq: Every day | ORAL | Status: DC
  Administered 2023-07-01: 12.5 mg via ORAL
  Filled 2023-07-01: qty 1

## 2023-07-01 MED ORDER — LACTATED RINGERS IV BOLUS
1000.0000 mL | Freq: Once | INTRAVENOUS | Status: AC
  Administered 2023-07-01: 1000 mL via INTRAVENOUS

## 2023-07-01 MED ORDER — AMLODIPINE BESYLATE 5 MG PO TABS
5.0000 mg | ORAL_TABLET | Freq: Once | ORAL | Status: AC
  Administered 2023-07-01: 5 mg via ORAL
  Filled 2023-07-01: qty 1

## 2023-07-01 MED ORDER — POTASSIUM CHLORIDE CRYS ER 20 MEQ PO TBCR: 20.0000 meq | EXTENDED_RELEASE_TABLET | Freq: Two times a day (BID) | ORAL | 0 refills | Status: DC

## 2023-07-01 MED ORDER — LEVETIRACETAM IN NACL 1000 MG/100ML IV SOLN
1000.0000 mg | Freq: Once | INTRAVENOUS | Status: AC
  Administered 2023-07-01: 1000 mg via INTRAVENOUS
  Filled 2023-07-01: qty 100

## 2023-07-01 MED ORDER — AMLODIPINE BESYLATE 5 MG PO TABS: 5.0000 mg | ORAL_TABLET | Freq: Every day | ORAL | 0 refills | Status: DC

## 2023-07-01 MED ORDER — ONDANSETRON HCL 4 MG PO TABS: 4.0000 mg | ORAL_TABLET | Freq: Four times a day (QID) | ORAL | 0 refills | Status: DC

## 2023-07-01 MED ORDER — HYDROMORPHONE HCL 1 MG/ML IJ SOLN
1.0000 mg | Freq: Once | INTRAMUSCULAR | Status: AC
  Administered 2023-07-01: 1 mg via INTRAVENOUS
  Filled 2023-07-01: qty 1

## 2023-07-01 NOTE — ED Provider Notes (Signed)
[place] EMERGENCY DEPARTMENT AT [facility] Provider Note   CSN: [id number] Arrival date & time: 07/01/23  1201     History  Chief Complaint  Patient presents with   Nausea   HPI Danielle Washington is a 47 y.o. female with history of hypertension, ischemic stroke, seizures presenting for nausea and vomiting.  States has been going for 3 weeks.  Also endorsing abdominal pain states abdominal pain is all over but worse in the right lower quadrant.  Had a normal bowel movement this morning.  States she does routinely use marijuana.  Denies headache or visual disturbance.  Also stated that she has not been able to keep down her Keppra because of the vomiting.  Also states she has got a headache and her chest has been hurting for the last 2 weeks as well.  Patient states that she is not currently taking anything for her high blood pressure.  HPI     Home Medications Prior to Admission medications   Medication Sig Start Date End Date Taking? Authorizing Provider  amLODipine (NORVASC) 5 MG tablet Take 1 tablet (5 mg total) by mouth daily. 07/01/23  Yes Riki Sheer K, PA-C  ondansetron (ZOFRAN) 4 MG tablet Take 1 tablet (4 mg total) by mouth every 6 (six) hours. 07/01/23  Yes Gareth Eagle, PA-C  potassium chloride SA (KLOR-CON M) 20 MEQ tablet Take 1 tablet (20 mEq total) by mouth 2 (two) times daily for 5 days. 07/01/23 07/06/23 Yes Gareth Eagle, PA-C  aspirin EC 81 MG tablet Take 81 mg by mouth daily.    [provider]  levETIRAcetam (KEPPRA) 500 MG tablet Take 1 tablet (500 mg total) by mouth 2 (two) times daily. 01/27/21   Vassie Loll, MD  nicotine (NICODERM CQ - DOSED IN MG/24 HOURS) 14 mg/24hr patch Place 1 patch (14 mg total) onto the skin daily. Patient not taking: Reported on 07/01/2023 01/28/21   Vassie Loll, MD  omeprazole (PRILOSEC) 20 MG capsule Take 1 capsule (20 mg total) by mouth 2 (two) times daily before a meal. 01/27/21   Vassie Loll, MD   ondansetron (ZOFRAN ODT) 8 MG disintegrating tablet Take 1 tablet (8 mg total) by mouth every 8 (eight) hours as needed for nausea or vomiting. 01/27/21   Vassie Loll, MD      Allergies    Patient has no known allergies.    Review of Systems   See HPI for pertinent positives   Physical Exam   Vitals:   07/01/23 1840 07/01/23 1900  BP: (!) 168/110 (!) 162/105  Pulse:  (!) 102  Resp: (!) 21 18  Temp:  98.3 F (36.8 C)  SpO2:  96%    CONSTITUTIONAL:  well-appearing, NAD NEURO:  Alert and oriented x 3, CN 3-12 grossly intact EYES:  eyes equal and reactive ENT/NECK:  Supple, no stridor  CARDIO:  regular rate and rhythm, appears well-perfused  PULM:  No respiratory distress, CTAB GI/GU:  non-distended, soft, RLQ tenderness MSK/SPINE:  No gross deformities, no edema, moves all extremities  SKIN:  no rash, atraumatic  *Additional and/or pertinent findings included in MDM below   ED Results / Procedures / Treatments   Labs (all labs ordered are listed, but only abnormal results are displayed) Labs Reviewed  CBC WITH DIFFERENTIAL/PLATELET - Abnormal; Notable for the following components:      Result Value   Lymphs Abs 0.6 (*)    All other components within normal limits  COMPREHENSIVE METABOLIC PANEL -  Abnormal; Notable for the following components:   Potassium 2.3 (*)    Chloride 96 (*)    CO2 33 (*)    Glucose, Bld 142 (*)    Calcium 8.8 (*)    Albumin 3.3 (*)    All other components within normal limits  URINALYSIS, ROUTINE W REFLEX MICROSCOPIC - Abnormal; Notable for the following components:   Color, Urine STRAW (*)    APPearance HAZY (*)    pH 9.0 (*)    Glucose, UA 50 (*)    Ketones, ur 20 (*)    All other components within normal limits  POTASSIUM - Abnormal; Notable for the following components:   Potassium 3.0 (*)    All other components within normal limits  LIPASE, BLOOD  TROPONIN I (HIGH SENSITIVITY)  TROPONIN I (HIGH SENSITIVITY)    EKG EKG  Interpretation Date/Time:  Saturday July 01 2023 12:11:24 EST Ventricular Rate:  82 PR Interval:  144 QRS Duration:  69 QT Interval:  396 QTC Calculation: 463 R Axis:   62  Text Interpretation: Age not entered, assumed to be  47 years old for purpose of ECG interpretation Sinus rhythm No significant change since last tracing Confirmed by Vanetta Mulders [phone]) on 07/01/2023 2:05:34 PM  Radiology CT ABDOMEN PELVIS W CONTRAST  Result Date: 07/01/2023 CLINICAL DATA:  Right lower quadrant abdominal pain EXAM: CT ABDOMEN AND PELVIS WITH CONTRAST TECHNIQUE: Multidetector CT imaging of the abdomen and pelvis was performed using the standard protocol following bolus administration of intravenous contrast. RADIATION DOSE REDUCTION: This exam was performed according to the departmental dose-optimization program which includes automated exposure control, adjustment of the mA and/or kV according to patient size and/or use of iterative reconstruction technique. CONTRAST:  OMNIPAQUE IOHEXOL 300 MG/ML  SOLN COMPARISON:  01/26/2021 FINDINGS: Lower chest: No acute abnormality. Coronary artery calcifications. Small hiatal hernia. Circumferential wall thickening of the included lower esophagus (series 3, image 1) Hepatobiliary: No solid liver abnormality is seen. No gallstones, gallbladder wall thickening, or biliary dilatation. Pancreas: Unremarkable. No pancreatic ductal dilatation or surrounding inflammatory changes. Spleen: Normal in size without significant abnormality. Adrenals/Urinary Tract: Adrenal glands are unremarkable. Kidneys are normal, without renal calculi, solid lesion, or hydronephrosis. Bladder is unremarkable. Stomach/Bowel: Stomach is within normal limits. Appendix appears normal (series 3, image 71). No evidence of bowel wall thickening, distention, or inflammatory changes. Vascular/Lymphatic: Aortic atherosclerosis. No enlarged abdominal or pelvic lymph nodes. Reproductive: No mass or  other significant abnormality. Other: No abdominal wall hernia or abnormality. No ascites. Musculoskeletal: No acute or significant osseous findings. IMPRESSION: 1. No acute CT findings of the abdomen or pelvis to explain right lower quadrant pain. Normal appendix. 2. Small hiatal hernia. Circumferential wall thickening of the included lower esophagus, most consistent with reflux esophagitis. 3. Coronary artery disease. 4. Aortic atherosclerosis advanced for patient. Aortic Atherosclerosis (ICD10-I70.0). Electronically Signed   By: Jearld Lesch M.D.   On: 07/01/2023 15:46   CT Head Wo Contrast  Result Date: 07/01/2023 CLINICAL DATA:  Headache.  Hypertension. EXAM: CT HEAD WITHOUT CONTRAST TECHNIQUE: Contiguous axial images were obtained from the base of the skull through the vertex without intravenous contrast. RADIATION DOSE REDUCTION: This exam was performed according to the departmental dose-optimization program which includes automated exposure control, adjustment of the mA and/or kV according to patient size and/or use of iterative reconstruction technique. COMPARISON:  09/05/2015 FINDINGS: Brain: Age advanced cerebral atrophy. No mass lesion, hemorrhage, hydrocephalus, acute infarct, intra-axial, or extra-axial fluid collection. Vascular: No hyperdense  vessel or unexpected calcification. Skull: Normal Sinuses/Orbits: Normal imaged portions of the orbits and globes. Clear paranasal sinuses and mastoid air cells. Other: None. IMPRESSION: 1.  No acute intracranial abnormality. 2. Age advanced cerebral atrophy. Electronically Signed   By: Jeronimo Greaves M.D.   On: 07/01/2023 15:41   DG Chest 1 View  Result Date: 07/01/2023 CLINICAL DATA:  Chest pain EXAM: CHEST  1 VIEW COMPARISON:  01/26/2021 FINDINGS: Numerous leads and wires project over the chest. Midline trachea. Borderline cardiomegaly. Azygous fissure. No pleural effusion or pneumothorax. No congestive failure. Suspect a small hiatal hernia. Clear  lungs. IMPRESSION: Borderline cardiomegaly, without acute disease. Electronically Signed   By: Jeronimo Greaves M.D.   On: 07/01/2023 13:54    Procedures Procedures    Medications Ordered in ED Medications  ondansetron (ZOFRAN) injection 4 mg (4 mg Intravenous Given 07/01/23 1325)  HYDROmorphone (DILAUDID) injection 1 mg (1 mg Intravenous Given 07/01/23 1325)  levETIRAcetam (KEPPRA) IVPB 1000 mg/100 mL premix (0 mg Intravenous Stopped 07/01/23 1403)  potassium chloride SA (KLOR-CON M) CR tablet 40 mEq (40 mEq Oral Given 07/01/23 1507)  potassium chloride 10 mEq in 100 mL IVPB (0 mEq Intravenous Stopped 07/01/23 1702)  iohexol (OMNIPAQUE) 300 MG/ML solution 100 mL (100 mLs Intravenous Contrast Given 07/01/23 1440)  lactated ringers bolus 1,000 mL (0 mLs Intravenous Stopped 07/01/23 1820)  magnesium sulfate IVPB 2 g 50 mL (0 g Intravenous Stopped 07/01/23 1736)  hydrALAZINE (APRESOLINE) injection 10 mg (10 mg Intravenous Given 07/01/23 1743)  amLODipine (NORVASC) tablet 5 mg (5 mg Oral Given 07/01/23 1809)    ED Course/ Medical Decision Making/ A&P Clinical Course as of 07/01/23 1929  Sat Jul 01, 2023  1735 pH(!): 9.0 [JR]  1846 BUN: 8 [JR]    Clinical Course User Index [JR] Gareth Eagle, PA-C                                 Medical Decision Making Amount and/or Complexity of Data Reviewed Labs: ordered. Decision-making details documented in ED Course. Radiology: ordered.  Risk Prescription drug management.   Initial Impression and Ddx 47 year old well-appearing female presenting for nausea vomiting.  Exam notable for hypertension and right lower quadrant tenderness.  DDx includes hypertensive emergency, stroke, ACS, pulmonary edema, appendicitis or other intra-abdominal infection, AKI. Patient PMH that increases complexity of ED encounter:  history of hypertension, ischemic stroke, seizures  Interpretation of Diagnostics - I independent reviewed and interpreted the labs as followed:  hypokalemia  - I independently visualized the following imaging with scope of interpretation limited to determining acute life threatening conditions related to emergency care: CT head with no acute findings, CT ab/pelvis revealed small hiatal hernia, circumferential wall thickening of the distal esophagus consistent with reflux esophagitis, chest x-ray revealed borderline cardiomegaly and no acute findings  -I personally reviewed interpret EKG which revealed sinus rhythm and QTc less than 500  Patient Reassessment and Ultimate Disposition/Management Treated hypertension with IV hydralazine and amlodipine.  Blood pressure did improve throughout encounter.  Volume resuscitated.  Treated her hypokalemia with oral and IV K.  Rechecked potassium and it went from 2.3-3.0.  Started her on amlodipine for her uncontrolled hypertension.  Workup does not suggest hypertensive emergency or intra-abdominal infection.  Also considered stroke but unlikely given no focal neurodeficit and reassuring CT head.  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 Sent amlodipine, K supps, and zofran to her pharmacy.  Advised to follow-up PCP.  Discussed per return precautions.  Fluid challenge without issue.  Discharged in good condition.   Patient management required discussion with the following services or consulting groups:  None  Complexity of Problems Addressed Acute complicated illness or Injury  Additional Data Reviewed and Analyzed Further history obtained from: Further history from spouse/family member, Past medical history and medications listed in the EMR, and Prior ED visit notes  Patient Encounter Risk Assessment Prescriptions         Final Clinical Impression(s) / ED Diagnoses Final diagnoses:  Hypokalemia  Nausea and vomiting, unspecified vomiting type  Hypertension, unspecified type    Rx / DC Orders ED Discharge Orders          Ordered    amLODipine (NORVASC) 5 MG tablet  Daily         07/01/23 1916    ondansetron (ZOFRAN) 4 MG tablet  Every 6 hours        07/01/23 1917    potassium chloride SA (KLOR-CON M) 20 MEQ tablet  2 times daily        07/01/23 1927              Gareth Eagle, PA-C 07/01/23 1929    Vanetta Mulders, MD 07/02/23 1143

## 2023-07-01 NOTE — ED Triage Notes (Signed)
Pt arrived via [facility] from home with c/o per EMS per family, pt has been "sick the past 3 weeks" but abdominal pain, as well as N/V started today. Pt is also hypertensive 173/129 per EMS and cbg 155. Pt does have a past medical Hx of stroke. Pt A&O

## 2023-07-01 NOTE — ED Notes (Signed)
Consumed 1 cup of water with no further nausea or vomiting

## 2023-07-01 NOTE — Discharge Instructions (Addendum)
Evaluation today was overall reassuring.  Suspect her nausea and vomiting could be related to marijuana use.  I did send Zofran to your pharmacy to help with nausea and vomiting at home.  Also during the encounter your potassium was found to be very low.  We have treated that here effectively but did send some potassium supplements to your pharmacy.  Please take those note for the next 5 days.  Also recommend potassium rich foods.  Your blood pressure was also very high during this encounter.  I am starting on amlodipine.  I sent a 30-day supply to your pharmacy.  If you have worsening headache, worsening nausea vomiting and diarrhea, abdominal pain, develop a fever, facial droop, slurred speech or weakness or numbness in your extremities or any other concerning symptom please return emergency department further evaluation.

## 2023-12-06 DIAGNOSIS — J329 Chronic sinusitis, unspecified: Secondary | ICD-10-CM | POA: Diagnosis not present

## 2023-12-06 DIAGNOSIS — R051 Acute cough: Secondary | ICD-10-CM | POA: Diagnosis not present

## 2023-12-06 DIAGNOSIS — J40 Bronchitis, not specified as acute or chronic: Secondary | ICD-10-CM | POA: Diagnosis not present

## 2024-04-13 DIAGNOSIS — R079 Chest pain, unspecified: Secondary | ICD-10-CM | POA: Diagnosis not present

## 2024-04-13 DIAGNOSIS — K219 Gastro-esophageal reflux disease without esophagitis: Secondary | ICD-10-CM | POA: Diagnosis not present

## 2024-04-13 DIAGNOSIS — Z90722 Acquired absence of ovaries, bilateral: Secondary | ICD-10-CM | POA: Diagnosis not present

## 2024-04-13 DIAGNOSIS — E876 Hypokalemia: Secondary | ICD-10-CM | POA: Diagnosis not present

## 2024-04-13 DIAGNOSIS — J45909 Unspecified asthma, uncomplicated: Secondary | ICD-10-CM | POA: Diagnosis not present

## 2024-04-13 DIAGNOSIS — F17210 Nicotine dependence, cigarettes, uncomplicated: Secondary | ICD-10-CM | POA: Diagnosis not present

## 2024-04-13 DIAGNOSIS — G40109 Localization-related (focal) (partial) symptomatic epilepsy and epileptic syndromes with simple partial seizures, not intractable, without status epilepticus: Secondary | ICD-10-CM | POA: Diagnosis not present

## 2024-04-13 DIAGNOSIS — R000 Tachycardia, unspecified: Secondary | ICD-10-CM | POA: Diagnosis not present

## 2024-04-13 DIAGNOSIS — K920 Hematemesis: Secondary | ICD-10-CM | POA: Diagnosis not present

## 2024-04-13 DIAGNOSIS — E1165 Type 2 diabetes mellitus with hyperglycemia: Secondary | ICD-10-CM | POA: Diagnosis not present

## 2024-04-13 DIAGNOSIS — Z8673 Personal history of transient ischemic attack (TIA), and cerebral infarction without residual deficits: Secondary | ICD-10-CM | POA: Diagnosis not present

## 2024-04-13 DIAGNOSIS — I10 Essential (primary) hypertension: Secondary | ICD-10-CM | POA: Diagnosis not present

## 2024-04-16 NOTE — Nursing Note (Signed)
   04/16/24 1346  Final Assessment  Patient's Post Acute Contact Information see demo  Has a PCP appointment been made? Yes  Is appointment within 7 days of discharge? No  Has a specialist appointment been made? Yes (Neuro referral pending)  Post Acute Facility needed at discharge? No  Home Care/ Home Medical Equipment needed at discharge? No  Outpatient/Community Referrals needed for discharge? No  Currently receiving outpatient dialysis? N/A  Discharge Disposition Home w/ Self Care  Transportation Anticipated family or friend will provide  Quality data for continuing care services shared with patient and/or representative? Yes  Patient and/or family were provided with choice of facilities / services that are available and appropriate to meet post hospital care needs? N/A  IMM Delivery  Follow Up Important Message (IM) letter given to patient and/or family? N/A  Final Assessment Complete  Final Assessment Complete Yes

## 2024-04-16 NOTE — Discharge Summary (Signed)
 ------------------------------------------------------------------------------- Attestation signed by Danielle Margart Fallow, DO at 04/16/24 1529 I was the supervising physician during time of service.  Case was discussed with APP and agree with management as outlined below. -------------------------------------------------------------------------------   DISCHARGE SUMMARY [facility]   Discharge date:   April 16, 2024 Length of stay:    LOS: 2 days    Discharge Service:   [facility] Hospitalists Discharge Attending Physician: No att. providers found Discharge to:    To Home Condition at Discharge:  good Code status:                         Full Code    ______________________________________  CAVEAT:  Patient is a poor historian due to somnolence, ?postictal.  All history obtained from ER physician, patient chart and nursing.   Admission HPI  Patient admitted on: 04/14/2024  4:33 AM  Patient admitted by: Thersia Roger, D.O.   CHIEF COMPLAINT:  Seizure   Day of admission HPI:  Kyliegh Jester  is a 48 y.o. female with a PMH significant for CVA, GERD, seizures, asthma, HTN who presented with seen in the ER earlier yesterday for N/V, found to be hypokalemic, - K was replaced and patient was discharged home.  At home she sustained two witnessed seizures and returned to the ER. During my interview patient is sleeping, doesn't wake up for questioning and doesn't provide any additional history.   In the ED the patient received labetalol, Keppra , lorazepam , Zofran , KCl.   Medical admission was requested for further workup and management of breakthrough seizure, hypokalemia.     Patient admitted on Home O2? - No Patient on home anticoagulant? -  No Patient admitted with Chronic home foley catheter? - No Foley catheter placed or replaced by another service prior to admission? - No Central Line Status: None   Mental Status on Admission: The patient IS NOT Alert and oriented  to PERSON The patient IS  NOT Alert And oriented to TIME The patient IS NOT Alert and oriented to LOCATION     Problem List, Assessment & Plan     ASSESSMENT & PLAN (In order of descending acuity)   48 y.o. female with a PMH significant for CVA, GERD, seizures, asthma, HTN now admitted with:   Breakthrough seizures Patient is symptomatic with two witnessed seizures at home Was initially postictal but now is alert and oriented Head CT negative, MRI today Continue IV Keppra  UDS showed amphetamines and cannabinoids   Moderate hypokalemia, hypomagnesemia - no EKG changes Potassium down to 2.7, replete with oral and IV => repeat potassium 02/15/2024 = 3.6 Magnesium  1.5, replete with IV riders => repeat magnesium  02/15/2024 = 1.8   Hyperglycemia, new onset DM History of DM2 Carb controlled diet when able to take PO Accuchecks q4h while NPO A1c 5.4   GERD  Continue Pepcid    HTN Rec'd labetalol in ER Patient does not take antihypertensives currently, used to be on amlodipine    Hematemesis Episode of coffee ground emesis 8/24 afternoon, moved to ICU History of PUD and h pylori IV Protonix  infusion Monitor Hg carefully, it has dropped from 12.6 to 8.3 in 24 hours; hemoglobin ranged from 9.1-8.7 on 04/15/2024 Monitoring every 4 hours, no active bleeding noted H pylori test of stool when able   Anticoagulation; SCDs (patient with possible GI bleed, no chemical prophylaxis currently) Oxygen ; none IV fluids; normal saline at 75/h IV drip; Protonix    Continue current treatment plan.   Continue  IV Protonix  and n.p.o. for now.   04-16-24==> started CLD for breakfast, full liquid diet for lunch and was still hungry so regular meal at 1 PM and tolerated that well (ate 25%) Will test stool for H. pylori when available.   No further hematemesis today.   Discussed patient's care with Dr. Feliz attending.     ADDITIONAL NON-ACUTE FINDINGS, OBSERVATIONS, FAMILY DISCUSSIONS, ETC. (When  present):   Physical exam General: No acute distress HEENT [place]/AT; moist mucous membrane Heart: Heart rate wnl: Rhythm is reg    Lungs: Clear to auscultation bilaterally Abdomen: Soft nontender nondistended Extremities: No peripheral edema Skin: Dry no rash Neuropsych: Within normal limits   DVT Prevention; SCD's  ______________________________________  Timeline of Significant Events: 04/15/24 - added metoprolol  12.5mg  BID for HTN and tachycardia.  04/14/2024 -patient admitted for breakthrough seizures.  Patient initiated on IV Keppra , seizure precautions in place.  Ativan  as needed, hypokalemia being repleted.  MRI brain ordered and pending, not available over the weekend.  04/14/24 Pt developed coffee ground emesis.  Check Hg regularly, start IV protonix .  NPO.     Mental Status On day of Discharge:  The patient is Alert and oriented to PERSON The patient is Alert And oriented to TIME The patient is Alert and oriented to LOCATION  CODE STATUS :                    Full Code   An advanced care planning discussion was  had with patient and/or patient's decisions maker (documented separately).  Patient discharged on Home O2? - no Patient discharged on home anticoagulant? -  no  Foley Catheter status: None Central Line Status: NONE  Time Spent on Discharge I spent greater than 30 minutes counseling and coordinating care for the discharge of this patient. The patient and I discussed the importance of outpatient follow-up as well as concerning signs and symptoms that would require immediate evaluation by a medical professional. The aforementioned conversation participants understand  and did show insight. I did use teachback to ensure understanding. The above participant/s is aware that not following the discussed plan, recommendations, and follow up can lead to severe negative effects on the patient's health, up to and including death.  Discharge Medications     Your  Medication List     START taking these medications    amlodipine  5 MG tablet Commonly known as: NORVASC  Take 1 tablet (5 mg total) by mouth daily.   cefdinir 300 MG capsule Commonly known as: OMNICEF Take 1 capsule (300 mg total) by mouth every twelve (12) hours for 10 days.   omeprazole  40 MG capsule Commonly known as: PriLOSEC Take 1 capsule (40 mg total) by mouth daily.       CHANGE how you take these medications    potassium chloride  20 MEQ ER tablet Take 1 tablet (20 mEq total) by mouth two (2) times a day for 10 days. What changed: Another medication with the same name was removed. Continue taking this medication, and follow the directions you see here.       CONTINUE taking these medications    famotidine  20 MG tablet Commonly known as: PEPCID  Take 1 tablet (20 mg total) by mouth two (2) times a day for 15 days.   levETIRAcetam  500 MG tablet Commonly known as: KEPPRA  Take 1 tablet (500 mg total) by mouth two (2) times a day.   promethazine  25 MG tablet Commonly known as: PHENERGAN  Take 1 tablet (25  mg total) by mouth every six (6) hours as needed for nausea for up to 7 days.       _____________________________________  Nutrition:                                   Patient does not meet AND/ASPEN criteria for malnutrition at this time (04/16/24 1230)             ___________________________________________  Discharge Instructions   I am giving you medication to temporarily help you with your current illness. You will need to see your family doctor for refill on these before they run out.  For your seizure, I am giving you Keppra . Take Keppra  500 mg twice a day every day. I am only giving you 30-day supply with 2 refills but she will need to see your doctor to get more.  For your blood pressure, I am giving you the amlodipine . Take amlodipine  5 mg daily. I am only giving you 30-day supply with 2 refills but you will need to see your family  doctor to get more.  For your stomach, I am giving you omeprazole  40 mg. Take omeprazole  40 mg.  Take it twice a day for the first 7 days then take it once a day daily after that. I am only giving you 30-day supply with 2 refills but you will need to see your family doctor to get more.  ER gave you a medication called famotidine .  This is also for your stomach. They called into your drugstore in [place] [facility]. They gave you 20 mg famotidine . You can take 1 tablet daily for 7 days after you finish my medication.  It has been a pleasure taking care of you.  I hope you get well soon.  If there is anything you need please do not hesitate to return to our ER to go to the closest ER.  Remember to make an appointment to see your doctor in the next 2 to 3 days for continuity of care and medication refill.  Call the let you know that you need a hospital follow-up visit.  They should be able to see you within the next 10 days or less.  Do not wait to make your appointment.  Call them today for the appointment  Nutrition:                                   Activity:                                   Activity Instructions     Activity as tolerated         Appointments:                          Follow Up:                              Follow Up instructions and Outpatient Referrals    Ambulatory Referral to [facility]     Reason for referral: establish new patient   Requested follow up plan: You would evaluate and manage.   Ambulatory Referral to Neurology     Reason for referral: previous  patient of Dr. Rosemarie, Needs to re-est   Specific Services Requested: Epilepsy   Requested follow up plan: You would evaluate and manage.   Call MD for:  difficulty breathing, headache or visual disturbances     Call MD for:  persistent dizziness or light-headedness     Call MD for:  persistent nausea or vomiting     Call MD for:  severe uncontrolled pain     Call MD for: Temperature >  38.5 Celsius ( > 101.3 Fahrenheit)     Discharge instructions         No Known Allergies   Past Medical History[1]  Past Surgical History[2]   Family History[3]   Current Medications[4]  Imaging  MRI Brain Wo Contrast Result Date: 04/15/2024 Exam:  Brain MRI without contrast (Seizure Protocol)  History:  Seizures. Hyperkalemia and hypoglycemia.  Technique:  Complete unenhanced brain MRI exam (seizure protocol)  Comparison:  Head CT 04/14/2024  Findings:  No acute intracranial hemorrhage, acute infarct, mass effect, midline shift, or extra-axial fluid collection.   Encephalomalacia in the left superior frontal gyrus. Small FLAIR hyperintensities in the right superior frontal gyrus (series 9/15) and high right parasagittal parietal cortex (series 9/4-5), noting prior outside brain MRI reported infarct of the high right frontal lobe. Small chronic infarct of the right posterior lentiform nucleus. Additional nonspecific scattered periventricular, subcortical, and deep white matter foci. Punctate chronic microhemorrhage in the right frontal centrum semiovale.  The hippocampi are symmetric in size and signal characteristics, with preserved internal architecture.   Normal pattern of sulcation without evidence of neuronal migration abnormality including gray matter heterotopia, cortical dysplasia, or polymicrogyria.  Ventricles are normal in size. Normal configuration of the cerebellar tonsils. The major arterial flow voids are preserved. Partially empty sella.  Calvarium is normal.  Orbits are unremarkable.  Paranasal sinuses are essentially clear.    1.    No acute infarct. 2.    Small FLAIR hyperintensities in the right superior frontal gyrus and high right parasagittal parietal cortex, nonspecific and of indeterminate age. Differential includes remote gliosis, postictal change, or metabolic encephalopathy. Cerebritis cannot be excluded. Recommend contrast-enhanced brain MRI and correlation with prior  outside imaging. 3.    Encephalomalacia in the left superior frontal gyrus and small chronic infarct of the right posterior lentiform nucleus. Additional scattered white matter foci, nonspecific but may represent mild chronic small vessel ischemic change, vasculitis, or other remote gliosis.  Signed (Electronic Signature): 04/15/2024 11:40 AM Signed By: Corean Henry, MD  ECG 12 Lead Result Date: 04/14/2024 Sinus tachycardia Right atrial enlargement Borderline ECG When compared with ECG of 14-Apr-2024 04:40, premature atrial complexes are no longer present ST no longer depressed in Inferior leads  ECG 12 Lead Result Date: 04/14/2024 Sinus rhythm with premature atrial complexes Possible Left atrial enlargement Nonspecific ST abnormality Abnormal ECG When compared with ECG of 13-Apr-2024 18:39, premature atrial complexes are now present Vent. rate has increased by  33 bpm Nonspecific T wave abnormality no longer evident in Anterior leads T wave amplitude has decreased in Lateral leads Confirmed by Cherie Searle ([id number]) on 04/14/2024 10:44:51 AM  XR Chest Portable Result Date: 04/14/2024 Exam:  Portable Chest  History:  Seizure    Technique: Single frontal view of the chest.  Comparison:  None.    Findings:  No airspace consolidation or focal infiltrate. There is no visible effusion. Mediastinum is unremarkable. Pulmonary vasculature appears normal.        Normal chest  Signed (Electronic Signature): 04/14/2024 6:35 AM Signed By: Luke Batter  CT Head Wo Contrast Result Date: 04/14/2024 Exam:  CT Head without Contrast  History:  altered mental status witnessed seizures    Technique: Routine head CT without IV contrast.  AEC (automated exposure control) and/or manual techniques such as size-specific kV and mAs are employed where appropriate to reduce radiation exposure for all CT exams.  Comparison:  None.      Findings:   CALVARIUM:  Regional bones and superficial soft tissues are unremarkable.       SINUSES AND MASTOIDS:  No mucosal thickening or fluid.      BRAIN:  No intracranial hemorrhage or evidence of an acute infarction. No intracranial mass or mass effect. Ventricles and cisterns are unremarkable.  The brain has an unremarkable noncontrast CT appearance     Negative unenhanced head CT.         Signed (Electronic Signature): 04/14/2024 4:53 AM Signed By: Luke Batter   Lab Results   Recent Labs    04/15/24 0414 04/15/24 0805 04/15/24 1611  WBC 16.9*  --   --   HGB 8.8*   < > 8.7*  HCT 25.8*   < > 25.8*  PLT 200  --   --    < > = values in this interval not displayed.   Recent Labs    04/13/24 1818 04/14/24 0500 04/14/24 0756 04/14/24 2006 04/15/24 0414 04/16/24 0417  NA 139   < >  --   --  137 137  K 3.0*   < >  --    < > 4.1 3.6  CL 101   < >  --   --  107 104  CO2 26.2   < >  --   --  20.4* 20.3*  BUN 9   < >  --   --  34* 8  CREATININE 0.58*   < >  --   --  0.40* 0.51*  GLU 210*   < >  --   --  118 100  CALCIUM  9.7   < >  --   --  7.6* 8.0*  ALBUMIN 4.2   < >  --   --  2.2*  --   PROT 8.1*   < >  --   --  5.1*  --   BILITOT 0.7   < >  --   --  0.3  --   AST 11*   < >  --   --  6*  --   ALT 16   < >  --   --  11*  --   ALKPHOS 96   < >  --   --  53  --   MG 1.6   < > 1.7  --  1.5* 1.8  PHOS  --   --  2.8  --   --   --   LIPASE 14*  --   --   --   --   --   TSH  --   --   --   --  0.940  --    < > = values in this interval not displayed.   Recent Labs    04/14/24 0500  TROPONINI 13  INR 1.02   Recent Labs    04/14/24 1450  WBCUA 1  NITRITE Negative  LEUKOCYTESUR Negative  BACTERIA None Seen  RBCUA 1  BLOODU Negative  GLUCOSEU Trace  PROTEINUA Trace*  KETONESU  10 mg/dL*   Recent Labs    91/75/74 0500 04/14/24 1450  OPIAU  --  Negative  BENZU  --  Negative  AMPHU  --  Positive*  COCAU  --  Negative  CANNAU  --  Positive*  BARBU  --  Negative  ETOH <3  --    No results for input(s): PREGTESTUR, PREGPOC in the last 72  hours. Recent Labs    04/14/24 0756  A1C 5.4   No results for input(s): O2SOUR, FIO2ART, PHART, PCO2ART, PO2ART, HCO3ART, O2SATART, BEART in the last 72 hours. Pending Labs     Order Current Status   Blood Culture #1 Preliminary result   Blood Culture #2 Preliminary result       Home Medications   Prior to Admission medications  Medication Dose, Route, Frequency  amlodipine  (NORVASC ) 5 MG tablet 5 mg, Oral, Daily (standard)  cefdinir (OMNICEF) 300 MG capsule 300 mg, Oral, Every 12 hours  famotidine  (PEPCID ) 20 MG tablet 20 mg, Oral, 2 times a day (standard)  levETIRAcetam  (KEPPRA ) 500 MG tablet 500 mg, Oral, 2 times a day (standard)  omeprazole  (PRILOSEC) 40 MG capsule 40 mg, Oral, Daily (standard)  potassium chloride  20 MEQ ER tablet 20 mEq, Oral, 2 times a day (standard)  promethazine  (PHENERGAN ) 25 MG tablet 25 mg, Oral, Every 6 hours PRN   Joana JONETTA Hurst, PA Hospitalist, [facility] 04/16/24, 1:49 PM      [1] Past Medical History: Diagnosis Date  . CVA (cerebral vascular accident)       . GERD (gastroesophageal reflux disease)   . Seizures       [2] Past Surgical History: Procedure Laterality Date  . OOPHORECTOMY    [3] History reviewed. No pertinent family history. [4]  Current Facility-Administered Medications:  .  acetaminophen  (TYLENOL ) tablet 650 mg, 650 mg, Oral, Q4H PRN, Hugelmeyer, Alexis, DO .  albuterol  2.5 mg /3 mL (0.083 %) nebulizer solution 2.5 mg, 2.5 mg, Nebulization, Q6H PRN, Hugelmeyer, Alexis, DO .  aluminum-magnesium  hydroxide-simethicone (MAALOX MAX) 80-80-8 mg/mL oral suspension, 30 mL, Oral, Q4H PRN, Hugelmeyer, Alexis, DO .  bisacodyl (DULCOLAX) EC tablet 10 mg, 10 mg, Oral, Daily PRN, Hugelmeyer, Alexis, DO .  cefTRIAXone  (ROCEPHIN ) IVPB 1 g in 50 mL dextrose  (premix), 1 g, Intravenous, Q24H, Talbott, Karna Jean, FNP, Stopped at 04/15/24 1530 .  dextrose  (GLUTOSE) 40 % gel 15 g of dextrose , 15 g of dextrose , Oral,  Q10 Min PRN, Hugelmeyer, Alexis, DO .  dextrose  50 % in water (D50W) 50 % solution 12.5 g, 12.5 g, Intravenous, Q15 Min PRN, Hugelmeyer, Alexis, DO .  glucagon injection 1 mg, 1 mg, Intramuscular, Once PRN, Hugelmeyer, Alexis, DO .  guaiFENesin (ROBITUSSIN) oral syrup, 200 mg, Oral, Q4H PRN, Hugelmeyer, Alexis, DO .  hydrALAZINE  (APRESOLINE ) injection 10 mg, 10 mg, Intravenous, Q6H PRN, Pace, Hagan Eggleston, FNP, 10 mg at 04/16/24 0120 .  levETIRAcetam  (KEPPRA ) injection 500 mg, 500 mg, Intravenous, Q12H SCH, Hugelmeyer, Alexis, DO, 500 mg at 04/16/24 9182 .  LORazepam  (ATIVAN ) injection 2 mg, 2 mg, Intravenous, Q4H PRN, Pace, Hagan Eggleston, FNP .  melatonin tablet 3 mg, 3 mg, Oral, Nightly PRN, Hugelmeyer, Alexis, DO, 3 mg at 04/15/24 2115 .  metoPROLOL  tartrate (Lopressor ) tablet 12.5 mg, 12.5 mg, Oral, BID, Hugelmeyer, Alexis, DO, 12.5 mg at 04/16/24 0816 .  morphine  4 mg/mL injection 1 mg, 1 mg, Intravenous, Q4H PRN, Hugelmeyer, Alexis, DO, 1 mg at 04/14/24 1933 .  naloxone (NARCAN) injection 0.1 mg, 0.1 mg, Intravenous, Q5  Min PRN, Hugelmeyer, Alexis, DO .  nicotine  (NICODERM CQ ) 14 mg/24 hr patch 1 patch, 1 patch, Transdermal, Daily, Pace, Hagan Eggleston, FNP, 1 patch at 04/16/24 0818 .  ondansetron  (ZOFRAN -ODT) disintegrating tablet 4 mg, 4 mg, Oral, Q8H PRN, 4 mg at 04/15/24 0108 **OR** ondansetron  (ZOFRAN -ODT) disintegrating tablet 8 mg, 8 mg, Oral, Q8H PRN, Hugelmeyer, Alexis, DO .  pantoprazole  80 mg in sodium chloride  0.9 % 100 mL (0.8 mg/mL) Infusion, 8 mg/hr, Intravenous, Continuous, Pace, Brutus Bryant, FNP, Stopped at 04/16/24 1311 .  polyethylene glycol (MIRALAX) packet 17 g, 17 g, Oral, Daily PRN, Hugelmeyer, Alexis, DO .  potassium chloride  10 mEq in 100 mL IVPB, 10 mEq, Intravenous, Once, Pace, Brutus Bryant, FNP, Stopped at 04/14/24 [phone]  prochlorperazine (COMPAZINE) injection 2.5 mg, 2.5 mg, Intravenous, Q8H PRN **OR** prochlorperazine (COMPAZINE) injection 5 mg, 5 mg,  Intravenous, Q8H PRN, Hugelmeyer, Alexis, DO .  sodium chloride  (NS) 0.9 % infusion, 75 mL/hr, Intravenous, Continuous, Pace, Brutus Bryant, FNP, Stopped at 04/16/24 1311 .  traMADol (ULTRAM) tablet 50 mg, 50 mg, Oral, Q4H PRN, Hugelmeyer, Alexis, DO, 50 mg at 04/15/24 0047  Current Outpatient Medications:  .  amlodipine  (NORVASC ) 5 MG tablet, Take 1 tablet (5 mg total) by mouth daily., Disp: 30 tablet, Rfl: 2 .  cefdinir (OMNICEF) 300 MG capsule, Take 1 capsule (300 mg total) by mouth every twelve (12) hours for 10 days., Disp: 20 capsule, Rfl: 0 .  famotidine  (PEPCID ) 20 MG tablet, Take 1 tablet (20 mg total) by mouth two (2) times a day for 15 days., Disp: 30 tablet, Rfl: 0 .  levETIRAcetam  (KEPPRA ) 500 MG tablet, Take 1 tablet (500 mg total) by mouth two (2) times a day., Disp: 60 tablet, Rfl: 2 .  omeprazole  (PRILOSEC) 40 MG capsule, Take 1 capsule (40 mg total) by mouth daily., Disp: 30 capsule, Rfl: 2 .  potassium chloride  20 MEQ ER tablet, Take 1 tablet (20 mEq total) by mouth two (2) times a day for 10 days., Disp: 20 tablet, Rfl: 0 .  promethazine  (PHENERGAN ) 25 MG tablet, Take 1 tablet (25 mg total) by mouth every six (6) hours as needed for nausea for up to 7 days., Disp: 30 tablet, Rfl: 0

## 2024-04-17 NOTE — Care Plan (Signed)
 Transition of Care Encounter Data   Call attempt: 1 Admission date: 04/14/24 Discharge date: 04/16/24 Discharge diagnosis: Breakthrough seizures Patient post discharge: Medications:      SABRA   [facility]: [phone]:  .  [facility]: [phone]:  .  Other: Contact PCP:        [facility] TRANSITIONAL CASE MANAGEMENT SUMMARY NOTE   Attempted to contact patient today at Home to complete Transitional Case Management call from [facility]. Left message for patient to return call; direct phone number included in message left for patient; 1st attempt.  Koren Shines, BSN, RN

## 2024-07-02 ENCOUNTER — Ambulatory Visit: Admitting: Neurology

## 2024-07-02 ENCOUNTER — Encounter: Payer: Self-pay | Admitting: Neurology

## 2024-07-08 NOTE — Progress Notes (Unsigned)
 Subjective:  Patient ID: Danielle Washington, female    DOB: 08/09/1976, 48 y.o.   MRN: [id number]  Patient Care Team: Deitra Morton Sebastian Nena, NP as PCP - General (Nurse Practitioner)   Chief Complaint:  Establish Care   HPI: Danielle Washington is a 48 y.o. female presenting on 07/10/2024 for Establish Care   Discussed the use of AI scribe software for clinical note transcription with the patient, who gave verbal consent to proceed.  History of Present Illness Danielle Washington is a 48 year old female who presents for health maintenance and to establish care.  She has a history of seizures and was recently seen in the emergency department on July 03, 2024, for a seizure episode. [facility] was transferred [facility] to [facility] and stayed in the hospital until the following Tuesday or Wednesday. During her hospital stay, she was found to have anemia and received a transfusion of two units of blood. She feels weak and in a fog since then. Her potassium level was low at 2.6 and was replaced during her hospital stay. She is currently on Keppra  500 mg twice a day for seizure management.  She has a history of hypertension and was previously on amlodipine , although it has not been called in recently. She also has a history of asthma but has no recent issues and is not on any medication for it.  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 She has difficulty remembering recent events but can recall [place]-term memories. She is on omeprazole  for GERD and has difficulty swallowing, even with soft foods and some liquids.  She smokes about half a pack of cigarettes a day and has been smoking for about a year after a two-year cessation. She previously used a nicotine  patch but stopped due to lack of insurance. She wants to resume the patch.  She reports a family history of lung cancer, as her mother died from it. She is concerned about her kidneys, although  recent tests show normal kidney function with an eGFR greater than 90 and normal BUN and creatinine levels.  She has high stress levels and has been on medication for anxiety in the past, although she cannot recall the specific medication. She also has difficulty sleeping, stating she rarely sleeps and wakes up frequently during the night.  She is currently taking Keppra , omeprazole , baby aspirin , and potassium supplements. Her potassium levels have been an issue for years, often requiring supplementation.       07/10/2024    3:14 PM  PHQ9 SCORE ONLY  PHQ-9 Total Score 22       07/10/2024    3:15 PM  GAD 7 : Generalized Anxiety Score  Nervous, Anxious, on Edge 2  Control/stop worrying 3  Worry too much - different things 2  Trouble relaxing 2  Restless 2  Easily annoyed or irritable 3  Afraid - awful might happen 3  Total GAD 7 Score 17  Anxiety Difficulty Extremely difficult      Relevant past medical, surgical, family, and social history reviewed and updated as indicated.  Allergies and medications reviewed and updated. Data reviewed: Chart in Epic.   Past Medical History:  Diagnosis Date   Anxiety    Asthma    GERD (gastroesophageal reflux disease)    Hypertension    Hypokalemia    Restless leg syndrome    Seizures (HCC)    09/04/2015. Unknown etiology and on Keppra   Stroke [facility])    x3 in September 2016. Numbness and tingling of hands is only deficit.    Past Surgical History:  Procedure Laterality Date   LAPAROSCOPIC BILATERAL SALPINGECTOMY Bilateral 01/06/2016   Procedure: LAPAROSCOPIC BILATERAL SALPINGECTOMY FOR STERILIZATION;  Surgeon: Vonn VEAR Inch, MD;  Location: [facility];  Service: Gynecology;  Laterality: Bilateral;   NO PAST SURGERIES     PARTIAL HYSTERECTOMY     TEE WITHOUT CARDIOVERSION N/A 05/26/2015   Procedure: TRANSESOPHAGEAL ECHOCARDIOGRAM (TEE);  Surgeon: Wilbert JONELLE Bihari, MD;  Location: [facility] ENDOSCOPY;  Service: Cardiovascular;  Laterality: N/A;     Social History   Socioeconomic History   Marital status: Single    Spouse name: Not on file   Number of children: 2   Years of education: Not on file   Highest education level: Not on file  Occupational History   Occupation: DELI ASSISTANT    Employer: [employer]  [place] Use   Smoking status: Every Day    Current packs/day: 1.00    Average packs/day: 1 pack/day for 20.0 years (20.0 ttl pk-yrs)    Types: Cigarettes   Smokeless [place]: Never  Vaping Use   Vaping status: Never Used  Substance and Sexual Activity   Alcohol use: Not Currently    Alcohol/week: 2.0 standard drinks of alcohol    Types: 1 Glasses of wine, 1 Shots of liquor per week    Comment: occasionally   Drug use: Yes    Frequency: 1.0 times per week    Types: Marijuana   Sexual activity: Never    Birth control/protection: None  Other Topics Concern   Not on file  Social History Narrative   Not on file   Social Drivers of Health   Financial Resource Strain: Patient Unable To Answer (04/14/2024)   Received from [facility]   Overall Financial Resource Strain (CARDIA)    How hard is it for you to pay for the very basics like food, housing, medical care, and heating?: Patient unable to answer  Food Insecurity: Patient Unable To Answer (04/14/2024)   Received from [facility]   Hunger Vital Sign    Within the past 12 months, you worried that your food would run out before you got the money to buy more.: Patient unable to answer    Within the past 12 months, the food you bought just didn't last and you didn't have money to get more.: Patient unable to answer  Transportation Needs: Patient Unable To Answer (04/14/2024)   Received from [facility] - Transportation    Lack of Transportation (Medical): Patient unable to answer    Lack of Transportation (Non-Medical): Patient unable to answer  Physical Activity: Patient Unable To Answer (04/14/2024)   Received from [facility]    Exercise Vital Sign    On average, how many days per week do you engage in moderate to strenuous exercise (like a brisk walk)?: Patient unable to answer    On average, how many minutes do you engage in exercise at this level?: Patient unable to answer  Stress: Patient Unable To Answer (04/14/2024)   Received from [facility] - Occupational Stress Questionnaire    Do you feel stress - tense, restless, nervous, or anxious, or unable to sleep at night because your mind is troubled all the time - these days?: Patient unable to answer  Social Connections: Unknown (04/14/2024)   Received from  [facility]   Social Connection and Isolation Panel    In a typical week, how many times do you talk on the phone with family, friends, or neighbors?: Patient unable to answer    How often do you get together with friends or relatives?: Patient unable to answer    How often do you attend church or religious services?: Patient unable to answer    Active Member of Clubs or Organizations: Not on file    How often do you attend meetings of the clubs or organizations you belong to?: Patient unable to answer    Are you married, widowed, divorced, separated, never married, or living with a partner?: Patient unable to answer  Intimate Partner Violence: Patient Unable To Answer (04/14/2024)   [facility]   Humiliation, Afraid, Rape, and Kick questionnaire    Within the last year, have you been afraid of your partner or ex-partner?: Patient unable to answer    Within the last year, have you been humiliated or emotionally abused in other ways by your partner or ex-partner?: Patient unable to answer    Within the last year, have you been kicked, hit, slapped, or otherwise physically hurt by your partner or ex-partner?: Patient unable to answer    Within the last year, have you been raped or forced to have any kind of sexual activity by your partner or  ex-partner?: Patient unable to answer    Outpatient Encounter Medications as of 07/10/2024  Medication Sig   aspirin  EC 81 MG tablet Take 81 mg by mouth daily.   escitalopram (LEXAPRO) 10 MG tablet Take 1 tablet (10 mg total) by mouth daily.   mirtazapine (REMERON) 7.5 MG tablet Take 1 tablet (7.5 mg total) by mouth at bedtime.   ondansetron  (ZOFRAN  ODT) 8 MG disintegrating tablet Take 1 tablet (8 mg total) by mouth every 8 (eight) hours as needed for nausea or vomiting.   [DISCONTINUED] levETIRAcetam  (KEPPRA ) 500 MG tablet Take 1 tablet (500 mg total) by mouth 2 (two) times daily.   [DISCONTINUED] omeprazole  (PRILOSEC) 20 MG capsule Take 1 capsule (20 mg total) by mouth 2 (two) times daily before a meal.   [DISCONTINUED] potassium chloride  SA (KLOR-CON  M) 20 MEQ tablet Take 1 tablet (20 mEq total) by mouth 2 (two) times daily for 5 days.   amLODipine  (NORVASC ) 5 MG tablet Take 1 tablet (5 mg total) by mouth daily.   levETIRAcetam  (KEPPRA ) 500 MG tablet Take 1 tablet (500 mg total) by mouth 2 (two) times daily.   nicotine  (NICODERM CQ  - DOSED IN MG/24 HOURS) 14 mg/24hr patch Place 1 patch (14 mg total) onto the skin daily.   omeprazole  (PRILOSEC) 20 MG capsule Take 1 capsule (20 mg total) by mouth 2 (two) times daily before a meal.   potassium chloride  SA (KLOR-CON  M) 20 MEQ tablet Take 1 tablet (20 mEq total) by mouth 2 (two) times daily for 5 days.   [DISCONTINUED] amLODipine  (NORVASC ) 5 MG tablet Take 1 tablet (5 mg total) by mouth daily. (Patient not taking: Reported on 07/10/2024)   [DISCONTINUED] nicotine  (NICODERM CQ  - DOSED IN MG/24 HOURS) 14 mg/24hr patch Place 1 patch (14 mg total) onto the skin daily. (Patient not taking: Reported on 07/10/2024)   [DISCONTINUED] ondansetron  (ZOFRAN ) 4 MG tablet Take 1 tablet (4 mg total) by mouth every 6 (six) hours. (Patient not taking: Reported on 07/10/2024)   No facility-administered encounter medications on file as of 07/10/2024.    No  Known  Allergies  Review of Systems  HENT:  Negative for ear pain.   Respiratory:  Negative for cough and wheezing.   Cardiovascular:  Negative for chest pain and leg swelling.  Gastrointestinal:  Negative for nausea and vomiting.  Neurological:  Positive for seizures. Negative for dizziness and headaches.       Hx seizures most recent one 07/03/2024  Psychiatric/Behavioral:  Positive for depression. The patient is nervous/anxious. The patient does not have insomnia.          Objective:  BP 128/82   Pulse 82   Temp 97.7 F (36.5 C)   Ht 5' 1.5 (1.562 m)   Wt 122 lb 9.6 oz (55.6 kg)   SpO2 99%   BMI 22.79 kg/m    Wt Readings from Last 3 Encounters:  07/10/24 122 lb 9.6 oz (55.6 kg)  01/26/21 142 lb 10.2 oz (64.7 kg)  02/04/19 154 lb 15.7 oz (70.3 kg)    Physical Exam Vitals and nursing note reviewed.  Constitutional:      General: She is not in acute distress. HENT:     Head: Normocephalic and atraumatic.     Right Ear: Tympanic membrane, ear canal and external ear normal. There is no impacted cerumen.     Left Ear: Tympanic membrane, ear canal and external ear normal. There is no impacted cerumen.     Nose: Nose normal.     Mouth/Throat:     Mouth: Mucous membranes are moist.  Eyes:     Extraocular Movements: Extraocular movements intact.     Conjunctiva/sclera: Conjunctivae normal.     Pupils: Pupils are equal, round, and reactive to light.  Neck:     Vascular: No carotid bruit.  Cardiovascular:     Heart sounds: Normal heart sounds.  Pulmonary:     Effort: Pulmonary effort is normal.     Breath sounds: Normal breath sounds.  Abdominal:     General: Bowel sounds are normal.     Palpations: Abdomen is soft.  Musculoskeletal:        General: Normal range of motion.     Cervical back: Normal range of motion and neck supple. No rigidity or tenderness.     Right lower leg: No edema.     Left lower leg: No edema.  Lymphadenopathy:     Cervical: No cervical  adenopathy.  Skin:    General: Skin is warm and dry.     Findings: No rash.  Neurological:     Mental Status: She is alert.  Psychiatric:        Attention and Perception: Attention and perception normal.        Mood and Affect: Mood is depressed.        Speech: Speech normal.        Behavior: Behavior normal. Behavior is cooperative.        Thought Content: Thought content normal. Thought content does not include homicidal or suicidal ideation. Thought content does not include homicidal or suicidal plan.        Cognition and Memory: Cognition and memory normal.        Judgment: Judgment normal.    Physical Exam VITALS: BP- 128/82     Results for orders placed or performed during the hospital encounter of 07/01/23  Urinalysis, Routine w reflex microscopic -Urine, Clean Catch   Collection Time: 07/01/23 12:54 PM  Result Value Ref Range   Color, Urine STRAW (A) YELLOW   APPearance HAZY (A) CLEAR  Specific Gravity, Urine 1.008 1.005 - 1.030   pH 9.0 (H) 5.0 - 8.0   Glucose, UA 50 (A) NEGATIVE mg/dL   Hgb urine dipstick NEGATIVE NEGATIVE   Bilirubin Urine NEGATIVE NEGATIVE   Ketones, ur 20 (A) NEGATIVE mg/dL   Protein, ur NEGATIVE NEGATIVE mg/dL   Nitrite NEGATIVE NEGATIVE   Leukocytes,Ua NEGATIVE NEGATIVE  CBC with Differential   Collection Time: 07/01/23  1:21 PM  Result Value Ref Range   WBC 5.0 4.0 - 10.5 K/uL   RBC 4.31 3.87 - 5.11 MIL/uL   Hemoglobin 13.0 12.0 - 15.0 g/dL   HCT 60.7 63.9 - 53.9 %   MCV 91.0 80.0 - 100.0 fL   MCH 30.2 26.0 - 34.0 pg   MCHC 33.2 30.0 - 36.0 g/dL   RDW 87.0 88.4 - 84.4 %   Platelets 333 150 - 400 K/uL   nRBC 0.0 0.0 - 0.2 %   Neutrophils Relative % 83 %   Neutro Abs 4.1 1.7 - 7.7 K/uL   Lymphocytes Relative 11 %   Lymphs Abs 0.6 (L) 0.7 - 4.0 K/uL   Monocytes Relative 6 %   Monocytes Absolute 0.3 0.1 - 1.0 K/uL   Eosinophils Relative 0 %   Eosinophils Absolute 0.0 0.0 - 0.5 K/uL   Basophils Relative 0 %   Basophils Absolute  0.0 0.0 - 0.1 K/uL   Immature Granulocytes 0 %   Abs Immature Granulocytes 0.01 0.00 - 0.07 K/uL  Comprehensive metabolic panel   Collection Time: 07/01/23  1:21 PM  Result Value Ref Range   Sodium 139 135 - 145 mmol/L   Potassium 2.3 (LL) 3.5 - 5.1 mmol/L   Chloride 96 (L) 98 - 111 mmol/L   CO2 33 (H) 22 - 32 mmol/L   Glucose, Bld 142 (H) 70 - 99 mg/dL   BUN 8 6 - 20 mg/dL   Creatinine, Ser 9.40 0.44 - 1.00 mg/dL   Calcium  8.8 (L) 8.9 - 10.3 mg/dL   Total Protein 6.5 6.5 - 8.1 g/dL   Albumin 3.3 (L) 3.5 - 5.0 g/dL   AST 15 15 - 41 U/L   ALT 12 0 - 44 U/L   Alkaline Phosphatase 55 38 - 126 U/L   Total Bilirubin 0.4 <1.2 mg/dL   GFR, Estimated >39 >39 mL/min   Anion gap 10 5 - 15  Lipase, blood   Collection Time: 07/01/23  1:21 PM  Result Value Ref Range   Lipase 27 11 - 51 U/L  Troponin I (High Sensitivity)   Collection Time: 07/01/23  1:27 PM  Result Value Ref Range   Troponin I (High Sensitivity) 12 <18 ng/L  Troponin I (High Sensitivity)   Collection Time: 07/01/23  3:32 PM  Result Value Ref Range   Troponin I (High Sensitivity) 15 <18 ng/L  Potassium   Collection Time: 07/01/23  6:13 PM  Result Value Ref Range   Potassium 3.0 (L) 3.5 - 5.1 mmol/L       Pertinent labs & imaging results that were available during my care of the patient were reviewed by me and considered in my medical decision making.  Assessment & Plan:  Danielle Washington was seen today for establish care.  Diagnoses and all orders for this visit:  Encounter for general adult medical examination with abnormal findings -     Lipid panel  Primary hypertension -     amLODipine  (NORVASC ) 5 MG tablet; Take 1 tablet (5 mg total) by mouth daily.  [place] abuse -  nicotine  (NICODERM CQ  - DOSED IN MG/24 HOURS) 14 mg/24hr patch; Place 1 patch (14 mg total) onto the skin daily.  Encounter for smoking cessation counseling -     Lipid panel -     Ambulatory Referral for Lung Cancer Scre -     nicotine   (NICODERM CQ  - DOSED IN MG/24 HOURS) 14 mg/24hr patch; Place 1 patch (14 mg total) onto the skin daily. -     omeprazole  (PRILOSEC) 20 MG capsule; Take 1 capsule (20 mg total) by mouth 2 (two) times daily before a meal. -     levETIRAcetam  (KEPPRA ) 500 MG tablet; Take 1 tablet (500 mg total) by mouth 2 (two) times daily. -     potassium chloride  SA (KLOR-CON  M) 20 MEQ tablet; Take 1 tablet (20 mEq total) by mouth 2 (two) times daily for 5 days. -     escitalopram (LEXAPRO) 10 MG tablet; Take 1 tablet (10 mg total) by mouth daily. -     mirtazapine (REMERON) 7.5 MG tablet; Take 1 tablet (7.5 mg total) by mouth at bedtime. -     amLODipine  (NORVASC ) 5 MG tablet; Take 1 tablet (5 mg total) by mouth daily. -     Anemia Profile B  Hypokalemia -     potassium chloride  SA (KLOR-CON  M) 20 MEQ tablet; Take 1 tablet (20 mEq total) by mouth 2 (two) times daily for 5 days.  Severe episode of recurrent major depressive disorder, without psychotic features (HCC) -     escitalopram (LEXAPRO) 10 MG tablet; Take 1 tablet (10 mg total) by mouth daily.  GAD (generalized anxiety disorder) -     mirtazapine (REMERON) 7.5 MG tablet; Take 1 tablet (7.5 mg total) by mouth at bedtime.  Insomnia due to medical condition -     mirtazapine (REMERON) 7.5 MG tablet; Take 1 tablet (7.5 mg total) by mouth at bedtime.  Seizures, generalized convulsive (HCC) -     levETIRAcetam  (KEPPRA ) 500 MG tablet; Take 1 tablet (500 mg total) by mouth 2 (two) times daily.  Gastroesophageal reflux disease, unspecified whether esophagitis present -     omeprazole  (PRILOSEC) 20 MG capsule; Take 1 capsule (20 mg total) by mouth 2 (two) times daily before a meal.  Anemia, unspecified type -     Anemia Profile B  Immunization due -     Flu vaccine trivalent PF, 6mos and older(Flulaval,Afluria,Fluarix,Fluzone) -     Varicella-zoster vaccine IM     Assessment and Plan Danielle Washington is a 48 year old Caucasian female seen today to establish  care, no acute distress Assessment & Plan Adult Wellness Visit Health maintenance: Administered flu and shingles vaccines today.  Essential hypertension Hypertension managed with amlodipine . Blood pressure today is 128/82 mmHg. - Restarted amlodipine  5 mg daily.  Seizure disorder Managed with Keppra  500 mg twice daily. Recent hospitalization for seizures with overnight stay. Neurology follow-up pending. - Refilled Keppra . - Encouraged follow-up with neurology.  Washington use disorder Danielle Washington use with recent relapse. Previously used nicotine  patch but discontinued due to insurance issues. Willing to restart nicotine  patch. - Restarted nicotine  patch. - Educated on not smoking while using nicotine  patch. Smoking/[place] Cessation Counseling Danielle Washington is a current user of [place] or nicotine  products. She is ready to quit at this time. Counseling provided today addressed the risks of continued use and the benefits of cessation. Discussed [place]/nicotine  use history, readiness to quit, and evidence-based treatment options including behavioral strategies, support resources, and pharmacologic therapies. Provided encouragement and educational  materials on steps and resources to quit smoking. Patient questions were addressed, and follow-up recommended for continued support. Total time spent on counseling: 3 minutes.   Anemia Recent hospitalization for anemia with low hemoglobin, hematocrit, MCV, and MCH. Received two units of blood transfusion. Plan to start iron supplementation after anemia panel. - Ordered anemia panel. - Will start iron supplementation after anemia panel results.  Hypokalemia Recent hypokalemia with potassium level of 2.6 mEq/L, replaced during hospitalization. Discharged on potassium supplementation. - Encouraged dietary intake of potassium-rich foods such as bananas and greens.  Low albumin Low albumin levels noted. Advised to increase protein intake to improve  albumin levels. - Encouraged intake of lean meats and protein supplements like Ensure or Boost.  Gastroesophageal reflux disease (GERD) GERD managed with omeprazole . - Refilled omeprazole .  Insomnia Difficulty sleeping, often waking up after falling asleep. Plan to start medication to aid sleep. - Started Remeron 7.5 mg for sleep at bedtime.  Depression and generalized anxiety disorder Severe stress and anxiety with high question scales. Previously on medication for anxiety. Plan to start Lexapro for anxiety and depression. - Started Lexapro 10 mg daily.  Dysphagia Difficulty swallowing both solids and liquids. Possible need for GI referral for further evaluation. - Will consider referral to GI for dysphagia evaluation.  Lung cancer screening Due to [place]-term smoking history and family history of lung cancer. Plan for low-dose CT scan for lung cancer screening. - Referred to pulmonology for lung cancer screening.   Labs: Anemia panel lipid ordered result pending  Continue all other maintenance medications.  Follow up plan: Return in about 4 weeks (around 08/07/2024) for [facility] .   Continue healthy lifestyle choices, including diet (rich in fruits, vegetables, and lean proteins, and low in salt and simple carbohydrates) and exercise (at least 30 minutes of moderate physical activity daily).  Educational handout given for    Clinical References  Anemia  Anemia is a condition in which there are not enough red blood cells or hemoglobin in the blood. Hemoglobin is a substance in red blood cells that carries oxygen . When you do not have enough red blood cells or hemoglobin (are anemic), your body cannot get enough oxygen , and your organs may not work properly. As a result, you may feel very tired or have other problems. What are the causes? Common causes of anemia include: Excessive bleeding. Anemia can be caused by excessive bleeding inside or outside the body, including  bleeding from the intestines or from heavy menstrual periods in females. Poor nutrition. Lusher-lasting (chronic) kidney, thyroid, and liver disease. Bone marrow disorders, spleen problems, and blood disorders. Cancer and treatments for cancer. Human immunodeficiency virus (HIV) and acquired immunodeficiency syndrome (AIDS). Infections, medicines, and autoimmune disorders that destroy red blood cells. What are the signs or symptoms? Symptoms of this condition include: Minor weakness. Dizziness. Headache, or difficulties concentrating and sleeping. Heartbeats that feel irregular or faster than normal (palpitations). Shortness of breath, especially with exercise. Pale skin, lips, and nails, or cold hands and feet. Upset stomach (indigestion) and nausea. Symptoms may occur suddenly or develop slowly. If your anemia is mild, you may not have symptoms. How is this diagnosed? This condition is diagnosed based on blood tests, your medical history, and a physical exam. In some cases, a test may be needed in which cells are removed from the soft tissue inside of a bone and looked at under a microscope (bone marrow biopsy). Your health care provider may also check your stool (feces) for  blood and may do more testing to look for the cause of your bleeding. Other tests may include: Imaging tests, such as a CT scan or MRI. A procedure to see inside your esophagus and stomach (endoscopy). The esophagus is the part of the body that moves food from your mouth to your stomach. A procedure to see inside your colon and rectum (colonoscopy). How is this treated? Treatment for this condition depends on the cause. If you continue to lose a lot of blood, you may need to be treated at a hospital. Treatment may include: Taking supplements of iron, vitamin B12, or folic acid. Taking a hormone medicine (erythropoietin) that can help to stimulate red blood cell growth. Receiving donated blood through an IV (blood  transfusion). This may be needed if you lose a lot of blood. Making changes to your diet. Having surgery to remove your spleen. Follow these instructions at home: Take over-the-counter and prescription medicines only as told by your health care provider. Take supplements only as told by your health care provider. Follow any diet instructions that you were given by your health care provider. Keep all follow-up visits. Your health care provider will want to recheck your blood tests. Contact a health care provider if: You develop new bleeding anywhere in the body. You are very weak. Get help right away if: You are short of breath. You have pain in your abdomen or chest. You are dizzy or feel faint. You have trouble concentrating. You have bloody stools, black stools, or tarry stools. You vomit repeatedly or you vomit up blood. These symptoms may be an emergency. Get help right away. Call 911. Do not wait to see if the symptoms will go away. Do not drive yourself to the hospital. Summary Anemia is a condition in which you do not have enough red blood cells or enough of a substance in your red blood cells that carries oxygen . Symptoms may occur suddenly or develop slowly. If your anemia is mild, you may not have symptoms. This condition is diagnosed with blood tests, a medical history, and a physical exam. Other tests may be needed. Treatment for this condition depends on the cause of the anemia. This information is not intended to replace advice given to you by your health care provider. Make sure you discuss any questions you have with your health care provider. Document Revised: 11/01/2021 Document Reviewed: 11/01/2021 Elsevier Patient Education  2024 Elsevier Inc. Health Maintenance, Female Adopting a healthy lifestyle and getting preventive care are important in promoting health and wellness. Ask your health care provider about: The right schedule for you to have regular tests and  exams. Things you can do on your own to prevent diseases and keep yourself healthy. What should I know about diet, weight, and exercise? Eat a healthy diet  Eat a diet that includes plenty of vegetables, fruits, low-fat dairy products, and lean protein. Do not eat a lot of foods that are high in solid fats, added sugars, or sodium. Maintain a healthy weight Body mass index (BMI) is used to identify weight problems. It estimates body fat based on height and weight. Your health care provider can help determine your BMI and help you achieve or maintain a healthy weight. Get regular exercise Get regular exercise. This is one of the most important things you can do for your health. Most adults should: Exercise for at least 150 minutes each week. The exercise should increase your heart rate and make you sweat (moderate-intensity exercise). Do strengthening  exercises at least twice a week. This is in addition to the moderate-intensity exercise. Spend less time sitting. Even light physical activity can be beneficial. Watch cholesterol and blood lipids Have your blood tested for lipids and cholesterol at 50 years of age, then have this test every 5 years. Have your cholesterol levels checked more often if: Your lipid or cholesterol levels are high. You are older than 50 years of age. You are at high risk for heart disease. What should I know about cancer screening? Depending on your health history and family history, you may need to have cancer screening at various ages. This may include screening for: Breast cancer. Cervical cancer. Colorectal cancer. Skin cancer. Lung cancer. What should I know about heart disease, diabetes, and high blood pressure? Blood pressure and heart disease High blood pressure causes heart disease and increases the risk of stroke. This is more likely to develop in people who have high blood pressure readings or are overweight. Have your blood pressure checked: Every  3-5 years if you are 43-77 years of age. Every year if you are 24 years old or older. Diabetes Have regular diabetes screenings. This checks your fasting blood sugar level. Have the screening done: Once every three years after age 26 if you are at a normal weight and have a low risk for diabetes. More often and at a younger age if you are overweight or have a high risk for diabetes. What should I know about preventing infection? Hepatitis B If you have a higher risk for hepatitis B, you should be screened for this virus. Talk with your health care provider to find out if you are at risk for hepatitis B infection. Hepatitis C Testing is recommended for: Everyone born from [birth year] through 1967. Anyone with known risk factors for hepatitis C. Sexually transmitted infections (STIs) Get screened for STIs, including gonorrhea and chlamydia, if: You are sexually active and are younger than 50 years of age. You are older than 50 years of age and your health care provider tells you that you are at risk for this type of infection. Your sexual activity has changed since you were last screened, and you are at increased risk for chlamydia or gonorrhea. Ask your health care provider if you are at risk. Ask your health care provider about whether you are at high risk for HIV. Your health care provider may recommend a prescription medicine to help prevent HIV infection. If you choose to take medicine to prevent HIV, you should first get tested for HIV. You should then be tested every 3 months for as Kail as you are taking the medicine. Pregnancy If you are about to stop having your period (premenopausal) and you may become pregnant, seek counseling before you get pregnant. Take 400 to 800 micrograms (mcg) of folic acid every day if you become pregnant. Ask for birth control (contraception) if you want to prevent pregnancy. Osteoporosis and menopause Osteoporosis is a disease in which the bones lose minerals and  strength with aging. This can result in bone fractures. If you are 36 years old or older, or if you are at risk for osteoporosis and fractures, ask your health care provider if you should: Be screened for bone loss. Take a calcium  or vitamin D supplement to lower your risk of fractures. Be given hormone replacement therapy (HRT) to treat symptoms of menopause. Follow these instructions at home: Alcohol use Do not drink alcohol if: Your health care provider tells you not to drink.  You are pregnant, may be pregnant, or are planning to become pregnant. If you drink alcohol: Limit how much you have to: 0-1 drink a day. Know how much alcohol is in your drink. In the [place] one drink equals one 12 oz bottle of beer (355 mL), one 5 oz glass of wine (148 mL), or one 1 oz glass of hard liquor (44 mL). Lifestyle Do not use any products that contain nicotine  or [place]. These products include cigarettes, chewing [place], and vaping devices, such as e-cigarettes. If you need help quitting, ask your health care provider. Do not use street drugs. Do not share needles. Ask your health care provider for help if you need support or information about quitting drugs. General instructions Schedule regular health, dental, and eye exams. Stay current with your vaccines. Tell your health care provider if: You often feel depressed. You have ever been abused or do not feel safe at home. Summary Adopting a healthy lifestyle and getting preventive care are important in promoting health and wellness. Follow your health care provider's instructions about healthy diet, exercising, and getting tested or screened for diseases. Follow your health care provider's instructions on monitoring your cholesterol and blood pressure. This information is not intended to replace advice given to you by your health care provider. Make sure you discuss any questions you have with your health care provider. Document Revised:  12/28/2020 Document Reviewed: 12/28/2020 Elsevier Patient Education  2024 Elsevier Inc. Managing Anxiety, Adult After being diagnosed with anxiety, you may be relieved to know why you have felt or behaved a certain way. You may also feel overwhelmed about the treatment ahead and what it will mean for your life. With care and support, you can manage your anxiety. How to manage lifestyle changes Understanding the difference between stress and anxiety Although stress can play a role in anxiety, it is not the same as anxiety. Stress is your body's reaction to life changes and events, both good and bad. Stress is often caused by something external, such as a deadline, test, or competition. It normally goes away after the event has ended and will last just a few hours. But, stress can be ongoing and can lead to more than just stress. Anxiety is caused by something internal, such as imagining a terrible outcome or worrying that something will go wrong that will greatly upset you. Anxiety often does not go away even after the event is over, and it can become a [place]-term (chronic) worry. Lowering stress and anxiety Talk with your health care provider or a counselor to learn more about lowering anxiety and stress. They may suggest tension-reduction techniques, such as: Music. Spend time creating or listening to music that you enjoy and that inspires you. Mindfulness-based meditation. Practice being aware of your normal breaths while not trying to control your breathing. It can be done while sitting or walking. Centering prayer. Focus on a word, phrase, or sacred image that means something to you and brings you peace. Deep breathing. Expand your stomach and inhale slowly through your nose. Hold your breath for 3-5 seconds. Then breathe out slowly, letting your stomach muscles relax. Self-talk. Learn to notice and spot thought patterns that lead to anxiety reactions. Change those patterns to thoughts that feel  peaceful. Muscle relaxation. Take time to tense muscles and then relax them. Choose a tension-reduction technique that fits your lifestyle and personality. These techniques take time and practice. Set aside 5-15 minutes a day to do them. Specialized therapists can offer  counseling and training in these techniques. The training to help with anxiety may be covered by some insurance plans. Other things you can do to manage stress and anxiety include: Keeping a stress diary. This can help you learn what triggers your reaction and then learn ways to manage your response. Thinking about how you react to certain situations. You may not be able to control everything, but you can control your response. Making time for activities that help you relax and not feeling guilty about spending your time in this way. Doing visual imagery. This involves imagining or creating mental pictures to help you relax. Practicing yoga. Through yoga poses, you can lower tension and relax.   Medicines Medicines for anxiety include: Antidepressant medicines. These are usually prescribed for Eick-term daily control. Anti-anxiety medicines. These may be added in severe cases, especially when panic attacks occur. When used together, medicines, psychotherapy, and tension-reduction techniques may be the most effective treatment. Relationships Relationships can play a big part in helping you recover. Spend more time connecting with trusted friends and family members. Think about going to couples counseling if you have a partner, taking family education classes, or going to family therapy. Therapy can help you and others better understand your anxiety. How to recognize changes in your anxiety Everyone responds differently to treatment for anxiety. Recovery from anxiety happens when symptoms lessen and stop interfering with your daily life at home or work. This may mean that you will start to: Have better concentration and focus. Worry  will interfere less in your daily thinking. Sleep better. Be less irritable. Have more energy. Have improved memory. Try to recognize when your condition is getting worse. Contact your provider if your symptoms interfere with home or work and you feel like your condition is not improving. Follow these instructions at home: Activity Exercise. Adults should: Exercise for at least 150 minutes each week. The exercise should increase your heart rate and make you sweat (moderate-intensity exercise). Do strengthening exercises at least twice a week. Get the right amount and quality of sleep. Most adults need 7-9 hours of sleep each night. Lifestyle  Eat a healthy diet that includes plenty of vegetables, fruits, whole grains, low-fat dairy products, and lean protein. Do not eat a lot of foods that are high in fats, added sugars, or salt (sodium). Make choices that simplify your life. Do not use any products that contain nicotine  or [place]. These products include cigarettes, chewing [place], and vaping devices, such as e-cigarettes. If you need help quitting, ask your provider. Avoid caffeine, alcohol, and certain over-the-counter cold medicines. These may make you feel worse. Ask your pharmacist which medicines to avoid. General instructions Take over-the-counter and prescription medicines only as told by your provider. Keep all follow-up visits. This is to make sure you are managing your anxiety well or if you need more support. Where to find support You can get help and support from: Self-help groups. Online and [employer]. A trusted spiritual leader. Couples counseling. Family education classes. Family therapy. Where to find more information You may find that joining a support group helps you deal with your anxiety. The following sources can help you find counselors or support groups near you: Mental Health [place]: mentalhealthamerica.net Anxiety and Depression Association  of America (ADAA): adaa.org [employer] on Mental Illness (NAMI): nami.org Contact a health care provider if: You have a hard time staying focused or finishing tasks. You spend many hours a day feeling worried about everyday life. You are very  tired because you cannot stop worrying. You start to have headaches or often feel tense. You have chronic nausea or diarrhea. Get help right away if: Your heart feels like it is racing. You have shortness of breath. You have thoughts of hurting yourself or others. Get help right away if you feel like you may hurt yourself or others, or have thoughts about taking your own life. Go to your nearest emergency room or: Call 911. Call the National Suicide Prevention Lifeline at 404-038-5570 or 988. This is open 24 hours a day. Text the Crisis Text Line at (217)719-4602. This information is not intended to replace advice given to you by your health care provider. Make sure you discuss any questions you have with your health care provider. Document Revised: 05/17/2022 Document Reviewed: 11/29/2020 Elsevier Patient Education  2024 Elsevier Inc. GERD in Adults: Diet Changes When you have gastroesophageal reflux disease (GERD), you may need to make changes to your diet. Choosing the right foods can help with your symptoms. Think about working with an expert in healthy eating called a dietitian. They can help you make healthy food choices. What are tips for following this plan? Reading food labels Look for foods that are low in saturated fat. Foods that may help with your symptoms include: Foods with less than 5% of daily value (DV) of fat. Foods with 0 grams of trans fat. Cooking [employer] in ways that don't use a lot of fat. These ways include: Baking. Steaming. Grilling. Broiling. To add flavor, try to use herbs that are low in spice and acidity. Avoid frying your food. Meal planning  Eat small meals often rather than eating 3 large meals  each day. Eat your meals slowly in a place where you feel relaxed. If told by your health care provider, avoid: Foods that cause symptoms. Keep a food diary to keep track of foods that cause symptoms. Alcohol. Drinking a lot of liquid with meals. General instructions For 2-3 hours after you eat, avoid: Bending over. Exercise. Lying down. Chew sugar-free gum after meals. What foods should I eat? Eat a healthy diet. Try to include: Foods with high amounts of fiber. These include: Fruits and vegetables. Whole grains and beans. Low-fat dairy products. Lean meats, fish, and poultry. Egg whites. Foods that cause symptoms in someone else may not cause symptoms for you. Work with your provider to find foods that are safe for you. The items listed above may not be all the foods and drinks you can have. Talk with a dietitian to learn more. The items listed above may not be a complete list of foods and beverages you can eat and drink. Contact a dietitian for more information. What foods should I avoid? Limiting some of these foods may help with your symptoms. Each person is different. Talk with a dietitian or your provider to help you find the exact foods to avoid. Some of the foods to avoid may include: Fruits Fruits with a lot of acid in them. These may include citrus fruits, such as oranges, grapefruit, pineapple, and lemons. Vegetables Deep-fried vegetables, such as French fries. Vegetables, sauces, or toppings made with added fat and vegetables with acid in them. These may include tomatoes and tomato products, chili peppers, onions, garlic, and horseradish. Grains Pastries or quick breads with added fat. Meats and other proteins High-fat meats, such as fatty beef or pork, hot dogs, ribs, ham, sausage, salami, and bacon. Fried meat or protein, such as fried fish and fried chicken.  Egg yolks. Fats and oils Butter. Margarine. Shortening. Ghee. Drinks Coffee and other drinks with caffeine  in them. Fizzy and sugary drinks, such as soda and energy drinks. Fruit juice made with acidic fruits, such as orange or grapefruit. Tomato juice. Sweets and desserts Chocolate and cocoa. Donuts. Seasonings and condiments Mint, such as peppermint and spearmint. Condiments, herbs, or seasonings that cause symptoms. These may include curry, hot sauce, or vinegar-based salad dressings. The items listed above may not be all the foods and drinks you should avoid. Talk with a dietitian to learn more. Questions to ask your health care provider Changes to your diet and everyday life are often the first steps taken to manage symptoms of GERD. If these changes don't help, talk with your provider about taking medicines. Where to find more information International Foundation for Gastrointestinal Disorders: aboutgerd.org This information is not intended to replace advice given to you by your health care provider. Make sure you discuss any questions you have with your health care provider. Document Revised: 06/20/2023 Document Reviewed: 01/04/2023 Elsevier Patient Education  2024 Elsevier Inc. Hypokalemia Hypokalemia means that the amount of potassium in the blood is lower than normal. Potassium is a mineral (electrolyte) that helps regulate the amount of fluid in the body. It also stimulates muscle tightening (contraction) and helps nerves work properly. Normally, most of the body's potassium is inside cells, and only a very small amount is in the blood. Because the amount in the blood is so small, minor changes to potassium levels in the blood can be life-threatening. What are the causes? This condition may be caused by: Antibiotic medicine. Diarrhea or vomiting. Taking too much of a medicine that helps you have a bowel movement (laxative) can cause diarrhea and lead to hypokalemia. Chronic kidney disease (CKD). Medicines that help the body get rid of excess fluid (diuretics). Eating disorders, such as  anorexia or bulimia. Low magnesium  levels in the body. Sweating a lot. What are the signs or symptoms? Symptoms of this condition include: Weakness. Constipation. Fatigue. Muscle cramps. Mental confusion. Skipped heartbeats or irregular heartbeat (palpitations). Tingling or numbness. How is this diagnosed? This condition is diagnosed with a blood test. How is this treated? This condition may be treated by: Taking potassium supplements. Adjusting the medicines that you take. Eating more foods that contain a lot of potassium. If your potassium level is very low, you may need to get potassium through an IV and be monitored in the hospital. Follow these instructions at home: Eating and drinking  Eat a healthy diet. A healthy diet includes fresh fruits and vegetables, whole grains, healthy fats, and lean proteins. If told, eat more foods that contain a lot of potassium. These include: Nuts, such as peanuts and pistachios. Seeds, such as sunflower seeds and pumpkin seeds. Peas, lentils, and lima beans. Whole grain and bran cereals and breads. Fresh fruits and vegetables, such as apricots, avocado, bananas, cantaloupe, kiwi, oranges, tomatoes, asparagus, and potatoes. Juices, such as orange, tomato, and prune. Lean meats, including fish. Milk and milk products, such as yogurt. General instructions Take over-the-counter and prescription medicines only as told by your health care provider. This includes vitamins, natural food products, and supplements. Keep all follow-up visits. This is important. Contact a health care provider if: You have weakness that gets worse. You feel your heart pounding or racing. You vomit. You have diarrhea. You have diabetes and you have trouble keeping your blood sugar in your target range. Get help right away if: You  have chest pain. You have shortness of breath. You have vomiting or diarrhea that lasts for more than 2 days. You faint. These  symptoms may be an emergency. Get help right away. Call 911. Do not wait to see if the symptoms will go away. Do not drive yourself to the hospital. Summary Hypokalemia means that the amount of potassium in the blood is lower than normal. This condition is diagnosed with a blood test. Hypokalemia may be treated by taking potassium supplements, adjusting the medicines that you take, or eating more foods that are high in potassium. If your potassium level is very low, you may need to get potassium through an IV and be monitored in the hospital. This information is not intended to replace advice given to you by your health care provider. Make sure you discuss any questions you have with your health care provider. Document Revised: 04/22/2021 Document Reviewed: 04/22/2021 Elsevier Patient Education  2024 Elsevier Inc. Insomnia Insomnia is a sleep disorder that makes it difficult to fall asleep or stay asleep. Insomnia can cause fatigue, low energy, difficulty concentrating, mood swings, and poor performance at work or school. There are three different ways to classify insomnia: Difficulty falling asleep. Difficulty staying asleep. Waking up too early in the morning. Any type of insomnia can be Bodie-term (chronic) or short-term (acute). Both are common. Short-term insomnia usually lasts for 3 months or less. Chronic insomnia occurs at least three times a week for longer than 3 months. What are the causes? Insomnia may be caused by another condition, situation, or substance, such as: Having certain mental health conditions, such as anxiety and depression. Using caffeine, alcohol, [place], or drugs. Having gastrointestinal conditions, such as gastroesophageal reflux disease (GERD). Having certain medical conditions. These include: Asthma. Alzheimer's disease. Stroke. Chronic pain. An overactive thyroid gland (hyperthyroidism). Other sleep disorders, such as restless legs syndrome and sleep  apnea. Menopause. Sometimes, the cause of insomnia may not be known. What increases the risk? Risk factors for insomnia include: Gender. Females are affected more often than males. Age. Insomnia is more common as people get older. Stress and certain medical and mental health conditions. Lack of exercise. Having an irregular work schedule. This may include working night shifts and traveling between different time zones. What are the signs or symptoms? If you have insomnia, the main symptom is having trouble falling asleep or having trouble staying asleep. This may lead to other symptoms, such as: Feeling tired or having low energy. Feeling nervous about going to sleep. Not feeling rested in the morning. Having trouble concentrating. Feeling irritable, anxious, or depressed. How is this diagnosed? This condition may be diagnosed based on: Your symptoms and medical history. Your health care provider may ask about: Your sleep habits. Any medical conditions you have. Your mental health. A physical exam. How is this treated? Treatment for insomnia depends on the cause. Treatment may focus on treating an underlying condition that is causing the insomnia. Treatment may also include: Medicines to help you sleep. Counseling or therapy. Lifestyle adjustments to help you sleep better. Follow these instructions at home: Eating and drinking  Limit or avoid alcohol, caffeinated beverages, and products that contain nicotine  and [place], especially close to bedtime. These can disrupt your sleep. Do not eat a large meal or eat spicy foods right before bedtime. This can lead to digestive discomfort that can make it hard for you to sleep. Sleep habits  Keep a sleep diary to help you and your health care provider figure  out what could be causing your insomnia. Write down: When you sleep. When you wake up during the night. How well you sleep and how rested you feel the next day. Any side effects of  medicines you are taking. What you eat and drink. Make your bedroom a dark, comfortable place where it is easy to fall asleep. Put up shades or blackout curtains to block light from outside. Use a white noise machine to block noise. Keep the temperature cool. Limit screen use before bedtime. This includes: Not watching TV. Not using your smartphone, tablet, or computer. Stick to a routine that includes going to bed and waking up at the same times every day and night. This can help you fall asleep faster. Consider making a quiet activity, such as reading, part of your nighttime routine. Try to avoid taking naps during the day so that you sleep better at night. Get out of bed if you are still awake after 15 minutes of trying to sleep. Keep the lights down, but try reading or doing a quiet activity. When you feel sleepy, go back to bed. General instructions Take over-the-counter and prescription medicines only as told by your health care provider. Exercise regularly as told by your health care provider. However, avoid exercising in the hours right before bedtime. Use relaxation techniques to manage stress. Ask your health care provider to suggest some techniques that may work well for you. These may include: Breathing exercises. Routines to release muscle tension. Visualizing peaceful scenes. Make sure that you drive carefully. Do not drive if you feel very sleepy. Keep all follow-up visits. This is important. Contact a health care provider if: You are tired throughout the day. You have trouble in your daily routine due to sleepiness. You continue to have sleep problems, or your sleep problems get worse. Get help right away if: You have thoughts about hurting yourself or someone else. Get help right away if you feel like you may hurt yourself or others, or have thoughts about taking your own life. Go to your nearest emergency room or: Call 911. Call the National Suicide Prevention Lifeline  at 747-550-5016 or 988. This is open 24 hours a day. Text the Crisis Text Line at (732)527-1895. Summary Insomnia is a sleep disorder that makes it difficult to fall asleep or stay asleep. Insomnia can be [place]-term (chronic) or short-term (acute). Treatment for insomnia depends on the cause. Treatment may focus on treating an underlying condition that is causing the insomnia. Keep a sleep diary to help you and your health care provider figure out what could be causing your insomnia. This information is not intended to replace advice given to you by your health care provider. Make sure you discuss any questions you have with your health care provider. Document Revised: 07/19/2021 Document Reviewed: 07/19/2021 Elsevier Patient Education  2024 [employer] Hypertension, Adult Hypertension is another name for high blood pressure. High blood pressure forces your heart to work harder to pump blood. This can cause problems over time. There are two numbers in a blood pressure reading. There is a top number (systolic) over a bottom number (diastolic). It is best to have a blood pressure that is below 120/80. What are the causes? The cause of this condition is not known. Some other conditions can lead to high blood pressure. What increases the risk? Some lifestyle factors can make you more likely to develop high blood pressure: Smoking. Not getting enough exercise or physical activity. Being overweight. Having too much fat,  sugar, calories, or salt (sodium) in your diet. Drinking too much alcohol. Other risk factors include: Having any of these conditions: Heart disease. Diabetes. High cholesterol. Kidney disease. Obstructive sleep apnea. Having a family history of high blood pressure and high cholesterol. Age. The risk increases with age. Stress. What are the signs or symptoms? High blood pressure may not cause symptoms. Very high blood pressure (hypertensive crisis) may cause: Headache. Fast  or uneven heartbeats (palpitations). Shortness of breath. Nosebleed. Vomiting or feeling like you may vomit (nauseous). Changes in how you see. Very bad chest pain. Feeling dizzy. Seizures. How is this treated? This condition is treated by making healthy lifestyle changes, such as: Eating healthy foods. Exercising more. Drinking less alcohol. Your doctor may prescribe medicine if lifestyle changes do not help enough and if: Your top number is above 130. Your bottom number is above 80. Your personal target blood pressure may vary. Follow these instructions at home: Eating and drinking  If told, follow the DASH eating plan. To follow this plan: Fill one half of your plate at each meal with fruits and vegetables. Fill one fourth of your plate at each meal with whole grains. Whole grains include whole-wheat pasta, brown rice, and whole-grain bread. Eat or drink low-fat dairy products, such as skim milk or low-fat yogurt. Fill one fourth of your plate at each meal with low-fat (lean) proteins. Low-fat proteins include fish, chicken without skin, eggs, beans, and tofu. Avoid fatty meat, cured and processed meat, or chicken with skin. Avoid pre-made or processed food. Limit the amount of salt in your diet to less than 1,500 mg each day. Do not drink alcohol if: Your doctor tells you not to drink. You are pregnant, may be pregnant, or are planning to become pregnant. If you drink alcohol: Limit how much you have to: 0-1 drink a day for women. 0-2 drinks a day for men. Know how much alcohol is in your drink. In the [place] one drink equals one 12 oz bottle of beer (355 mL), one 5 oz glass of wine (148 mL), or one 1 oz glass of hard liquor (44 mL). Lifestyle  Work with your doctor to stay at a healthy weight or to lose weight. Ask your doctor what the best weight is for you. Get at least 30 minutes of exercise that causes your heart to beat faster (aerobic exercise) most days of the week.  This may include walking, swimming, or biking. Get at least 30 minutes of exercise that strengthens your muscles (resistance exercise) at least 3 days a week. This may include lifting weights or doing Pilates. Do not smoke or use any products that contain nicotine  or [place]. If you need help quitting, ask your doctor. Check your blood pressure at home as told by your doctor. Keep all follow-up visits. Medicines Take over-the-counter and prescription medicines only as told by your doctor. Follow directions carefully. Do not skip doses of blood pressure medicine. The medicine does not work as well if you skip doses. Skipping doses also puts you at risk for problems. Ask your doctor about side effects or reactions to medicines that you should watch for. Contact a doctor if: You think you are having a reaction to the medicine you are taking. You have headaches that keep coming back. You feel dizzy. You have swelling in your ankles. You have trouble with your vision. Get help right away if: You get a very bad headache. You start to feel mixed up (confused). You feel  weak or numb. You feel faint. You have very bad pain in your: Chest. Belly (abdomen). You vomit more than once. You have trouble breathing. These symptoms may be an emergency. Get help right away. Call 911. Do not wait to see if the symptoms will go away. Do not drive yourself to the hospital. Summary Hypertension is another name for high blood pressure. High blood pressure forces your heart to work harder to pump blood. For most people, a normal blood pressure is less than 120/80. Making healthy choices can help lower blood pressure. If your blood pressure does not get lower with healthy choices, you may need to take medicine. This information is not intended to replace advice given to you by your health care provider. Make sure you discuss any questions you have with your health care provider. Document Revised: 05/27/2021  Document Reviewed: 05/27/2021 Elsevier Patient Education  2024 [employer] Inc[employer] Seizure, Adult A seizure is a sudden burst of abnormal activity in the brain. Seizures usually last from 30 seconds to 2 minutes. There are many types of seizures. And they can cause many different symptoms. What are the causes? Common causes of a seizure include: Fever or infection. Problems that affect the brain. These may include: A brain or head injury. A stroke. A brain tumor. Low levels of blood sugar or salt. Kidney problems or liver problems. Some inherited conditions. These are passed down from parent to child. Problems with a substance, such as: Having a reaction to a drug or a medicine. Stopping the use of a substance all of a sudden. When this causes problems, it's called withdrawal. Disorders that affect how you develop, such as autism spectrum disorder or cerebral palsy. Sometimes, the cause may not be known. Some people who have a seizure never have another one. A person who has repeated seizures over time without a clear cause has a condition called epilepsy. What increases the risk? Having a family history of epilepsy. Having had a tonic-clonic seizure before. This type of seizure causes: The muscles of the whole body to tighten, or contract. Loss of consciousness. Having a head injury or a stroke in the past. Having had too little oxygen  at birth. What are the signs or symptoms? The symptoms vary depending on the type of seizure you have. Symptoms during a seizure Having convulsions. This means shaking with fast, jerky movements of muscles. Stiffness of the body. Breathing problems. Being confused. Staring or not responding to sound or touch. Head nodding, eye blinking, eye twitching, or fast eye movements. Drooling, grunting, or making clicking sounds with your mouth. Losing control of when you pee or poop. Symptoms before a seizure Feeling afraid, worried, or nervous. Feeling like  you may vomit. Vertigo. This feels like: You are moving when you're not. Things around you are moving when they're not. Dj vu. This is a feeling of having seen or heard something before. Odd tastes or smells. Changes in how you see. You may see flashing lights or spots. Symptoms after a seizure Being confused. Feeling sleepy. Headache. Sore muscles. How is this diagnosed? A seizure may be diagnosed based on: A description of your symptoms. Video of your seizures can be helpful. Your medical history. A physical exam. Tests, such as: Blood tests. CT scan. MRI. Electroencephalogram, or EEG. This test measures electrical activity in the brain. A test of your spinal fluid. This is called a spinal tap or lumbar puncture. How is this treated? If your seizure stops on its own, you will  not need treatment. If your seizure lasts longer than 5 minutes, you'll normally need treatment. This may include: Medicines given through an IV. Avoiding things, such as medicines, that are known to cause your seizures. Medicines to prevent seizures. These are called antiepileptics. A device to prevent or control seizures. Eating foods that are low in carbohydrates and high in fat (ketogenic diet). Surgery. This is sometimes needed if you keep having seizures. Follow these instructions at home: Medicines Take your medicines only as told by your health care provider. Avoid anything that may keep your medicine from working, such as alcohol. Activity Follow your provider's advice about driving, swimming, and doing other things that would be dangerous if you had a seizure. Wait until your provider says it's safe for you to do these things. If you live in the [place] ask your local department of motor vehicles [facility]) when you can drive. Get enough rest and sleep. Not getting enough sleep can make seizures more likely to happen. Teaching others  Teach friends and family what to do if you have a seizure. Tell  them to: Help you get down to the ground safely. Protect your head and body. Loosen any clothing around your neck. Turn you on your side. This helps keep your airway clear if you vomit. Know whether or not you need emergency care. Stay with you until you are better. Also, tell them what not to do if you have a seizure. Tell them: They should not hold you down. They should not put anything in your mouth. General instructions Avoid anything that has caused you to have seizures. Keep a seizure diary. Write down: What you remember about each seizure. What you think might have caused each seizure. Keep all follow-up visits. Your provider may need to monitor your progress. Contact a health care provider if: You have another seizure or seizures. Call each time you have a seizure. You have a change in how often or when you have seizures. You keep having seizures with treatment. You have symptoms of being sick or having an infection. You are not able to take your medicine. Get help right away if: You have or someone has seen you have: A seizure that lasts longer than 5 minutes. Many seizures in a row and you don't feel better between seizures. A seizure that makes it harder to breathe. A seizure that leaves you unable to speak or use a part of your body. You didn't wake up right away after a seizure. You injure yourself during a seizure. You have confusion or pain right after a seizure. These symptoms may be an emergency. Call 911 right away. Do not wait to see if the symptoms will go away. Do not drive yourself to the hospital. This information is not intended to replace advice given to you by your health care provider. Make sure you discuss any questions you have with your health care provider. Document Revised: 05/11/2023 Document Reviewed: 09/21/2022 Elsevier Patient Education  2024 [employer] Inc. Managing Depression, Adult Depression is a mental health condition that affects your  thoughts, feelings, and actions. Being diagnosed with depression can bring you relief if you did not know why you have felt or behaved a certain way. It could also leave you feeling overwhelmed. Finding ways to manage your symptoms can help you feel more positive about your future. How to manage lifestyle changes Being depressed is difficult. Depression can increase the level of everyday stress. Stress can make depression symptoms worse. You may believe  your symptoms cannot be managed or will never improve. However, there are many things you can try to help manage your symptoms. There is hope. Managing stress  Stress is your body's reaction to life changes and events, both good and bad. Stress can add to your feelings of depression. Learning to manage your stress can help lessen your feelings of depression. Try some of the following approaches to reducing your stress (stress reduction techniques): Listen to music that you enjoy and that inspires you. Try using a meditation app or take a meditation class. Develop a practice that helps you connect with your spiritual self. Walk in nature, pray, or go to a place of worship. Practice deep breathing. To do this, inhale slowly through your nose. Pause at the top of your inhale for a few seconds and then exhale slowly, letting yourself relax. Repeat this three or four times. Practice yoga to help relax and work your muscles. Choose a stress reduction technique that works for you. These techniques take time and practice to develop. Set aside 5-15 minutes a day to do them. Therapists can offer training in these techniques. Do these things to help manage stress: Keep a journal. Know your limits. Set healthy boundaries for yourself and others, such as saying no when you think something is too much. Pay attention to how you react to certain situations. You may not be able to control everything, but you can change your reaction. Add humor to your life by  watching funny movies or shows. Make time for activities that you enjoy and that relax you. Spend less time using electronics, especially at night before bed. The light from screens can make your brain think it is time to get up rather than go to bed.   Medicines Medicines, such as antidepressants, are often a part of treatment for depression. Talk with your pharmacist or health care provider about all the medicines, supplements, and herbal products that you take, their possible side effects, and what medicines and other products are safe to take together. Make sure to report any side effects you may have to your health care provider. Relationships Your health care provider may suggest family therapy, couples therapy, or individual therapy as part of your treatment. How to recognize changes Everyone responds differently to treatment for depression. As you recover from depression, you may start to: Have more interest in doing activities. Feel more hopeful. Have more energy. Eat a more regular amount of food. Have better mental focus. It is important to recognize if your depression is not getting better or is getting worse. The symptoms you had in the beginning may return, such as: Feeling tired. Eating too much or too little. Sleeping too much or too little. Feeling restless, agitated, or hopeless. Trouble focusing or making decisions. Having unexplained aches and pains. Feeling irritable, angry, or aggressive. If you or your family members notice these symptoms coming back, let your health care provider know right away. Follow these instructions at home: Activity Try to get some form of exercise each day, such as walking. Try yoga, mindfulness, or other stress reduction techniques. Participate in group activities if you are able. Lifestyle Get enough sleep. Cut down on or stop using caffeine, [place], alcohol, and any other harmful substances. Eat a healthy diet that includes plenty  of vegetables, fruits, whole grains, low-fat dairy products, and lean protein. Limit foods that are high in solid fats, added sugar, or salt (sodium). General instructions Take over-the-counter and prescription medicines only as  told by your health care provider. Keep all follow-up visits. It is important for your health care provider to check on your mood, behavior, and medicines. Your health care provider may need to make changes to your treatment. Where to find support Talking to others  Friends and family members can be sources of support and guidance. Talk to trusted friends or family members about your condition. Explain your symptoms and let them know that you are working with a health care provider to treat your depression. Tell friends and family how they can help. Finances Find mental health providers that fit with your financial situation. Talk with your health care provider if you are worried about access to food, housing, or medicine. Call your insurance company to learn about your co-pays and prescription plan. Where to find more information You can find support in your area from: Anxiety and Depression Association of [place] (ADAA): adaa.org Mental Health [place]: mentalhealthamerica.net [employer] on Mental Illness: nami.org Contact a health care provider if: You stop taking your antidepressant medicines, and you have any of these symptoms: Nausea. Headache. Light-headedness. Chills and body aches. Not being able to sleep (insomnia). You or your friends and family think your depression is getting worse. Get help right away if: You have thoughts of hurting yourself or others. Get help right away if you feel like you may hurt yourself or others, or have thoughts about taking your own life. Go to your nearest emergency room or: Call 911. Call the National Suicide Prevention Lifeline at (902)686-2107 or 988. This is open 24 hours a day. Text the Crisis Text Line at  2496765524. This information is not intended to replace advice given to you by your health care provider. Make sure you discuss any questions you have with your health care provider. Document Revised: 12/14/2021 Document Reviewed: 12/14/2021 Elsevier Patient Education  2024 [employer] Inc.  The above assessment and management plan was discussed with the patient. The patient verbalized understanding of and has agreed to the management plan. Patient is aware to call the clinic if they develop any new symptoms or if symptoms persist or worsen. Patient is aware when to return to the clinic for a follow-up visit. Patient educated on when it is appropriate to go to the emergency department.  @SIGNATURE @

## 2024-07-10 ENCOUNTER — Ambulatory Visit: Payer: Self-pay | Admitting: Nurse Practitioner

## 2024-07-10 ENCOUNTER — Encounter: Payer: Self-pay | Admitting: Nurse Practitioner

## 2024-07-10 VITALS — BP 128/82 | HR 82 | Temp 97.7°F | Ht 61.5 in | Wt 122.6 lb

## 2024-07-10 DIAGNOSIS — R569 Unspecified convulsions: Secondary | ICD-10-CM

## 2024-07-10 DIAGNOSIS — G4701 Insomnia due to medical condition: Secondary | ICD-10-CM

## 2024-07-10 DIAGNOSIS — F332 Major depressive disorder, recurrent severe without psychotic features: Secondary | ICD-10-CM | POA: Diagnosis not present

## 2024-07-10 DIAGNOSIS — Z716 Tobacco abuse counseling: Secondary | ICD-10-CM | POA: Diagnosis not present

## 2024-07-10 DIAGNOSIS — K219 Gastro-esophageal reflux disease without esophagitis: Secondary | ICD-10-CM | POA: Diagnosis not present

## 2024-07-10 DIAGNOSIS — I10 Essential (primary) hypertension: Secondary | ICD-10-CM | POA: Diagnosis not present

## 2024-07-10 DIAGNOSIS — F17210 Nicotine dependence, cigarettes, uncomplicated: Secondary | ICD-10-CM

## 2024-07-10 DIAGNOSIS — D649 Anemia, unspecified: Secondary | ICD-10-CM | POA: Diagnosis not present

## 2024-07-10 DIAGNOSIS — Z720 Tobacco use: Secondary | ICD-10-CM | POA: Diagnosis not present

## 2024-07-10 DIAGNOSIS — Z0001 Encounter for general adult medical examination with abnormal findings: Secondary | ICD-10-CM | POA: Insufficient documentation

## 2024-07-10 DIAGNOSIS — Z23 Encounter for immunization: Secondary | ICD-10-CM | POA: Diagnosis not present

## 2024-07-10 DIAGNOSIS — E876 Hypokalemia: Secondary | ICD-10-CM | POA: Diagnosis not present

## 2024-07-10 DIAGNOSIS — F411 Generalized anxiety disorder: Secondary | ICD-10-CM | POA: Diagnosis not present

## 2024-07-10 MED ORDER — MIRTAZAPINE 7.5 MG PO TABS: 7.5000 mg | ORAL_TABLET | Freq: Every day | ORAL | 1 refills | Status: DC

## 2024-07-10 MED ORDER — AMLODIPINE BESYLATE 5 MG PO TABS: 5.0000 mg | ORAL_TABLET | Freq: Every day | ORAL | 0 refills | Status: DC

## 2024-07-10 MED ORDER — ESCITALOPRAM OXALATE 10 MG PO TABS: 10.0000 mg | ORAL_TABLET | Freq: Every day | ORAL | 5 refills | Status: DC

## 2024-07-10 MED ORDER — POTASSIUM CHLORIDE CRYS ER 20 MEQ PO TBCR: 20.0000 meq | EXTENDED_RELEASE_TABLET | Freq: Two times a day (BID) | ORAL | 0 refills | Status: AC

## 2024-07-10 MED ORDER — LEVETIRACETAM 500 MG PO TABS: 500.0000 mg | ORAL_TABLET | Freq: Two times a day (BID) | ORAL | 2 refills | Status: AC

## 2024-07-10 MED ORDER — OMEPRAZOLE 20 MG PO CPDR: 20.0000 mg | DELAYED_RELEASE_CAPSULE | Freq: Two times a day (BID) | ORAL | 2 refills | Status: AC

## 2024-07-10 MED ORDER — NICOTINE 14 MG/24HR TD PT24: 14.0000 mg | MEDICATED_PATCH | Freq: Every day | TRANSDERMAL | 0 refills | Status: AC

## 2024-07-11 ENCOUNTER — Ambulatory Visit: Payer: Self-pay | Admitting: Nurse Practitioner

## 2024-07-11 LAB — LIPID PANEL
Chol/HDL Ratio: 2.6 ratio (ref 0.0–4.4)
Cholesterol, Total: 144 mg/dL (ref 100–199)
HDL: 55 mg/dL (ref >39)
LDL Chol Calc (NIH): 76 mg/dL (ref 0–99)
Triglycerides: 61 mg/dL (ref 0–149)
VLDL Cholesterol Cal: 13 mg/dL (ref 5–40)

## 2024-07-11 LAB — ANEMIA PROFILE B
Basophils Absolute: 0 x10E3/uL (ref 0.0–0.2)
Basos: 1 %
EOS (ABSOLUTE): 0.1 x10E3/uL (ref 0.0–0.4)
Eos: 2 %
Ferritin: 10 ng/mL — ABNORMAL LOW (ref 15–150)
Folate: 7.7 ng/mL (ref >3.0)
Hematocrit: 27.4 % — ABNORMAL LOW (ref 34.0–46.6)
Hemoglobin: 8.1 g/dL — CL (ref 11.1–15.9)
Immature Grans (Abs): 0 x10E3/uL (ref 0.0–0.1)
Immature Granulocytes: 0 %
Iron Saturation: 4 % — CL (ref 15–55)
Iron: 17 ug/dL — ABNORMAL LOW (ref 27–159)
Lymphocytes Absolute: 2 x10E3/uL (ref 0.7–3.1)
Lymphs: 47 %
MCH: 24.4 pg — ABNORMAL LOW (ref 26.6–33.0)
MCHC: 29.6 g/dL — ABNORMAL LOW (ref 31.5–35.7)
MCV: 83 fL (ref 79–97)
Monocytes Absolute: 0.4 x10E3/uL (ref 0.1–0.9)
Monocytes: 8 %
Neutrophils Absolute: 1.9 x10E3/uL (ref 1.4–7.0)
Neutrophils: 42 %
Platelets: 393 x10E3/uL (ref 150–450)
RBC: 3.32 x10E6/uL — ABNORMAL LOW (ref 3.77–5.28)
RDW: 21.3 % — ABNORMAL HIGH (ref 11.7–15.4)
Retic Ct Pct: 2.4 % (ref 0.6–2.6)
Total Iron Binding Capacity: 437 ug/dL (ref 250–450)
UIBC: 420 ug/dL (ref 131–425)
Vitamin B-12: 265 pg/mL (ref 232–1245)
WBC: 4.4 x10E3/uL (ref 3.4–10.8)

## 2024-07-11 MED ORDER — IRON (FERROUS SULFATE) 325 (65 FE) MG PO TABS: 325.0000 mg | ORAL_TABLET | Freq: Every day | ORAL | 1 refills | Status: AC

## 2024-08-05 NOTE — Progress Notes (Unsigned)
 Subjective:  Patient ID: Danielle Washington, female    DOB: [birth year], 48 y.o.   MRN: [id number]  Patient Care Team: Deitra Morton Sebastian Nena, NP as PCP - General (Nurse Practitioner)   Chief Complaint:  No chief complaint on file.   HPI: Danielle Washington is a 48 y.o. female presenting on 08/07/2024 for No chief complaint on file.   Discussed the use of AI scribe software for clinical note transcription with the patient, who gave verbal consent to proceed.  History of Present Illness  Last seen 07/10/2024 and was started on Lexapro  for MDD  Depression and generalized anxiety disorder Severe stress and anxiety with high question scales. Previously on medication for anxiety. Plan to start Lexapro  for anxiety and depression. - Started Lexapro  10 mg daily.     Relevant past medical, surgical, family, and social history reviewed and updated as indicated.  Allergies and medications reviewed and updated. Data reviewed: Chart in Epic.   Past Medical History:  Diagnosis Date   Anxiety    Asthma    GERD (gastroesophageal reflux disease)    Hypertension    Hypokalemia    Restless leg syndrome    Seizures (HCC)    09/04/2015. Unknown etiology and on Keppra    Stroke [facility])    x3 in September 2016. Numbness and tingling of hands is only deficit.    Past Surgical History:  Procedure Laterality Date   LAPAROSCOPIC BILATERAL SALPINGECTOMY Bilateral 01/06/2016   Procedure: LAPAROSCOPIC BILATERAL SALPINGECTOMY FOR STERILIZATION;  Surgeon: Vonn VEAR Inch, MD;  Location: [facility];  Service: Gynecology;  Laterality: Bilateral;   NO PAST SURGERIES     PARTIAL HYSTERECTOMY     TEE WITHOUT CARDIOVERSION N/A 05/26/2015   Procedure: TRANSESOPHAGEAL ECHOCARDIOGRAM (TEE);  Surgeon: Wilbert JONELLE Bihari, MD;  Location: [facility] ENDOSCOPY;  Service: Cardiovascular;  Laterality: N/A;    Social History   Socioeconomic History   Marital status: Single    Spouse name: Not on file   Number of children: 2   Years of  education: Not on file   Highest education level: Not on file  Occupational History   Occupation: DELI ASSISTANT    Employer: [employer]  Tobacco Use   Smoking status: Every Day    Current packs/day: 1.00    Average packs/day: 1 pack/day for 20.0 years (20.0 ttl pk-yrs)    Types: Cigarettes   Smokeless tobacco: Never  Vaping Use   Vaping status: Never Used  Substance and Sexual Activity   Alcohol use: Not Currently    Alcohol/week: 2.0 standard drinks of alcohol    Types: 1 Glasses of wine, 1 Shots of liquor per week    Comment: occasionally   Drug use: Yes    Frequency: 1.0 times per week    Types: Marijuana   Sexual activity: Never    Birth control/protection: None  Other Topics Concern   Not on file  Social History Narrative   Not on file   Social Drivers of Health   Tobacco Use: High Risk (07/10/2024)   Patient History    Smoking Tobacco Use: Every Day    Smokeless Tobacco Use: Never    Passive Exposure: Not on file  Financial Resource Strain: Patient Unable To Answer (04/14/2024)   Received from [facility]   Overall Financial Resource Strain (CARDIA)    How hard is it for you to pay for the very basics like food, housing, medical care, and heating?: Patient unable to answer  Food  Insecurity: Patient Unable To Answer (04/14/2024)   Received from [facility]   Epic    Within the past 12 months, you worried that your food would run out before you got the money to buy more.: Patient unable to answer    Within the past 12 months, the food you bought just didn't last and you didn't have money to get more.: Patient unable to answer  Transportation Needs: Patient Unable To Answer (04/14/2024)   Received from [facility] - Transportation    Lack of Transportation (Medical): Patient unable to answer    Lack of Transportation (Non-Medical): Patient unable to answer  Physical Activity: Patient Unable To Answer (04/14/2024)   Received from [facility]    Exercise Vital Sign    On average, how many days per week do you engage in moderate to strenuous exercise (like a brisk walk)?: Patient unable to answer    On average, how many minutes do you engage in exercise at this level?: Patient unable to answer  Stress: Patient Unable To Answer (04/14/2024)   Received from [facility] - Occupational Stress Questionnaire    Do you feel stress - tense, restless, nervous, or anxious, or unable to sleep at night because your mind is troubled all the time - these days?: Patient unable to answer  Social Connections: Unknown (04/14/2024)   Received from [facility]   Social Connection and Isolation Panel    In a typical week, how many times do you talk on the phone with family, friends, or neighbors?: Patient unable to answer    How often do you get together with friends or relatives?: Patient unable to answer    How often do you attend church or religious services?: Patient unable to answer    Active Member of Clubs or Organizations: Not on file    How often do you attend meetings of the clubs or organizations you belong to?: Patient unable to answer    Are you married, widowed, divorced, separated, never married, or living with a partner?: Patient unable to answer  Intimate Partner Violence: Patient Unable To Answer (04/14/2024)   Received from [facility]   Epic    Within the last year, have you been afraid of your partner or ex-partner?: Patient unable to answer    Within the last year, have you been humiliated or emotionally abused in other ways by your partner or ex-partner?: Patient unable to answer    Within the last year, have you been kicked, hit, slapped, or otherwise physically hurt by your partner or ex-partner?: Patient unable to answer    Within the last year, have you been raped or forced to have any kind of sexual activity by your partner or ex-partner?: Patient unable to answer   Depression (PHQ2-9): High Risk (07/10/2024)   Depression (PHQ2-9)    PHQ-2 Score: 22  Alcohol Screen: Not on file  Housing: Not on file  Utilities: Patient Unable To Answer (04/14/2024)   Received from [facility]   Utilities    Within the past 12 months, have you been unable to get utilities(heat, electricity) when it was really needed?: Patient unable to answer  Health Literacy: Not on file (04/14/2024)    Outpatient Encounter Medications as of 08/07/2024  Medication Sig   amLODipine  (NORVASC ) 5 MG tablet Take 1 tablet (5 mg total) by mouth daily.   aspirin  EC 81 MG  tablet Take 81 mg by mouth daily.   escitalopram  (LEXAPRO ) 10 MG tablet Take 1 tablet (10 mg total) by mouth daily.   Iron , Ferrous Sulfate , 325 (65 Fe) MG TABS Take 325 mg by mouth daily.   levETIRAcetam  (KEPPRA ) 500 MG tablet Take 1 tablet (500 mg total) by mouth 2 (two) times daily.   mirtazapine  (REMERON ) 7.5 MG tablet Take 1 tablet (7.5 mg total) by mouth at bedtime.   nicotine  (NICODERM CQ  - DOSED IN MG/24 HOURS) 14 mg/24hr patch Place 1 patch (14 mg total) onto the skin daily.   omeprazole  (PRILOSEC) 20 MG capsule Take 1 capsule (20 mg total) by mouth 2 (two) times daily before a meal.   ondansetron  (ZOFRAN  ODT) 8 MG disintegrating tablet Take 1 tablet (8 mg total) by mouth every 8 (eight) hours as needed for nausea or vomiting.   potassium chloride  SA (KLOR-CON  M) 20 MEQ tablet Take 1 tablet (20 mEq total) by mouth 2 (two) times daily for 5 days.   No facility-administered encounter medications on file as of 08/07/2024.    Allergies[1]  Pertinent ROS per HPI, otherwise unremarkable      Objective:  There were no vitals taken for this visit.   Wt Readings from Last 3 Encounters:  07/10/24 122 lb 9.6 oz (55.6 kg)  01/26/21 142 lb 10.2 oz (64.7 kg)  02/04/19 154 lb 15.7 oz (70.3 kg)    Physical Exam Physical Exam      Results for orders placed or performed in visit on 07/10/24  Lipid panel    Collection Time: 07/10/24  3:50 PM  Result Value Ref Range   Cholesterol, Total 144 100 - 199 mg/dL   Triglycerides 61 0 - 149 mg/dL   HDL 55 >60 mg/dL   VLDL Cholesterol Cal 13 5 - 40 mg/dL   LDL Chol Calc (NIH) 76 0 - 99 mg/dL   Chol/HDL Ratio 2.6 0.0 - 4.4 ratio  Anemia Profile B   Collection Time: 07/10/24  3:50 PM  Result Value Ref Range   Total Iron  Binding Capacity 437 250 - 450 ug/dL   UIBC 579 868 - 574 ug/dL   Iron  17 (L) 27 - 159 ug/dL   Iron  Saturation 4 (LL) 15 - 55 %   Ferritin 10 (L) 15 - 150 ng/mL   Vitamin B-12 265 232 - 1,245 pg/mL   Folate 7.7 >3.0 ng/mL   WBC 4.4 3.4 - 10.8 x10E3/uL   RBC 3.32 (L) 3.77 - 5.28 x10E6/uL   Hemoglobin 8.1 (LL) 11.1 - 15.9 g/dL   Hematocrit 72.5 (L) 65.9 - 46.6 %   MCV 83 79 - 97 fL   MCH 24.4 (L) 26.6 - 33.0 pg   MCHC 29.6 (L) 31.5 - 35.7 g/dL   RDW 78.6 (H) 88.2 - 84.5 %   Platelets 393 150 - 450 x10E3/uL   Neutrophils 42 Not Estab. %   Lymphs 47 Not Estab. %   Monocytes 8 Not Estab. %   Eos 2 Not Estab. %   Basos 1 Not Estab. %   Neutrophils Absolute 1.9 1.4 - 7.0 x10E3/uL   Lymphocytes Absolute 2.0 0.7 - 3.1 x10E3/uL   Monocytes Absolute 0.4 0.1 - 0.9 x10E3/uL   EOS (ABSOLUTE) 0.1 0.0 - 0.4 x10E3/uL   Basophils Absolute 0.0 0.0 - 0.2 x10E3/uL   Immature Granulocytes 0 Not Estab. %   Immature Grans (Abs) 0.0 0.0 - 0.1 x10E3/uL   Retic Ct Pct 2.4 0.6 - 2.6 %  Pertinent labs & imaging results that were available during my care of the patient were reviewed by me and considered in my medical decision making.  Assessment & Plan:  There are no diagnoses linked to this encounter.   Assessment and Plan Assessment & Plan       Continue all other maintenance medications.  Follow up plan: No follow-ups on file.   Continue healthy lifestyle choices, including diet (rich in fruits, vegetables, and lean proteins, and low in salt and simple carbohydrates) and exercise (at least 30 minutes of moderate physical  activity daily).  Educational handout given for ***  The above assessment and management plan was discussed with the patient. The patient verbalized understanding of and has agreed to the management plan. Patient is aware to call the clinic if they develop any new symptoms or if symptoms persist or worsen. Patient is aware when to return to the clinic for a follow-up visit. Patient educated on when it is appropriate to go to the emergency department.   Finnian Husted [place] Thompson, DNP [facility] [address] [place], [place] [address] [phone]      [1] No Known Allergies

## 2024-08-07 ENCOUNTER — Encounter: Payer: Self-pay | Admitting: Nurse Practitioner

## 2024-08-07 ENCOUNTER — Ambulatory Visit: Payer: Self-pay | Admitting: Nurse Practitioner

## 2024-08-07 VITALS — BP 136/82 | HR 84 | Temp 97.5°F | Ht 61.5 in | Wt 125.2 lb

## 2024-08-07 DIAGNOSIS — I10 Essential (primary) hypertension: Secondary | ICD-10-CM

## 2024-08-07 DIAGNOSIS — F411 Generalized anxiety disorder: Secondary | ICD-10-CM

## 2024-08-07 DIAGNOSIS — Z716 Tobacco abuse counseling: Secondary | ICD-10-CM

## 2024-08-07 DIAGNOSIS — F332 Major depressive disorder, recurrent severe without psychotic features: Secondary | ICD-10-CM

## 2024-08-07 DIAGNOSIS — Z1231 Encounter for screening mammogram for malignant neoplasm of breast: Secondary | ICD-10-CM

## 2024-08-07 DIAGNOSIS — Z1211 Encounter for screening for malignant neoplasm of colon: Secondary | ICD-10-CM

## 2024-08-07 DIAGNOSIS — G4701 Insomnia due to medical condition: Secondary | ICD-10-CM

## 2024-08-07 MED ORDER — ESCITALOPRAM OXALATE 20 MG PO TABS: 20.0000 mg | ORAL_TABLET | Freq: Every day | ORAL | 5 refills | Status: AC

## 2024-08-07 MED ORDER — AMLODIPINE BESYLATE 10 MG PO TABS: 10.0000 mg | ORAL_TABLET | Freq: Every day | ORAL | 0 refills | Status: AC

## 2024-08-07 MED ORDER — MIRTAZAPINE 7.5 MG PO TABS: 7.5000 mg | ORAL_TABLET | Freq: Every day | ORAL | 1 refills | Status: AC

## 2024-08-29 ENCOUNTER — Encounter: Payer: Self-pay | Admitting: *Deleted

## 2024-10-08 ENCOUNTER — Ambulatory Visit: Admitting: Nurse Practitioner

## 2024-10-08 ENCOUNTER — Ambulatory Visit: Admitting: Family Medicine
